# Patient Record
Sex: Male | Born: 1935 | Race: Black or African American | Hispanic: No | Marital: Single | Smoking: Former smoker
Health system: Southern US, Community
[De-identification: ages and names within clinical notes are randomized; demographics above are authoritative.]

## PROBLEM LIST (undated history)

## (undated) DIAGNOSIS — J9600 Acute respiratory failure, unspecified whether with hypoxia or hypercapnia: Secondary | ICD-10-CM

## (undated) DIAGNOSIS — E876 Hypokalemia: Secondary | ICD-10-CM

## (undated) DIAGNOSIS — I469 Cardiac arrest, cause unspecified: Secondary | ICD-10-CM

## (undated) DIAGNOSIS — I639 Cerebral infarction, unspecified: Secondary | ICD-10-CM

## (undated) DIAGNOSIS — R4182 Altered mental status, unspecified: Secondary | ICD-10-CM

## (undated) DIAGNOSIS — I10 Essential (primary) hypertension: Secondary | ICD-10-CM

## (undated) DIAGNOSIS — R569 Unspecified convulsions: Secondary | ICD-10-CM

## (undated) HISTORY — DX: Acute respiratory failure, unspecified whether with hypoxia or hypercapnia: J96.00

## (undated) HISTORY — DX: Unspecified convulsions: R56.9

## (undated) HISTORY — DX: Altered mental status, unspecified: R41.82

## (undated) HISTORY — DX: Hypokalemia: E87.6

---

## 2001-10-30 ENCOUNTER — Encounter: Payer: Self-pay | Admitting: Internal Medicine

## 2001-10-30 ENCOUNTER — Ambulatory Visit (HOSPITAL_COMMUNITY): Admission: RE | Admit: 2001-10-30 | Discharge: 2001-10-30 | Payer: Self-pay | Admitting: Internal Medicine

## 2006-03-20 ENCOUNTER — Emergency Department (HOSPITAL_COMMUNITY): Admission: EM | Admit: 2006-03-20 | Discharge: 2006-03-20 | Payer: Self-pay | Admitting: Emergency Medicine

## 2012-01-04 ENCOUNTER — Emergency Department (HOSPITAL_COMMUNITY): Payer: Medicare Other

## 2012-01-04 ENCOUNTER — Inpatient Hospital Stay (HOSPITAL_COMMUNITY)
Admission: EM | Admit: 2012-01-04 | Discharge: 2012-01-09 | DRG: 064 | Disposition: A | Discharge status: Home / Self Care | Payer: Medicare Other | Attending: Internal Medicine | Admitting: Internal Medicine

## 2012-01-04 ENCOUNTER — Inpatient Hospital Stay (HOSPITAL_COMMUNITY): Payer: Medicare Other

## 2012-01-04 ENCOUNTER — Encounter (HOSPITAL_COMMUNITY): Payer: Self-pay | Admitting: Neurology

## 2012-01-04 DIAGNOSIS — I129 Hypertensive chronic kidney disease with stage 1 through stage 4 chronic kidney disease, or unspecified chronic kidney disease: Secondary | ICD-10-CM | POA: Diagnosis present

## 2012-01-04 DIAGNOSIS — N189 Chronic kidney disease, unspecified: Secondary | ICD-10-CM | POA: Diagnosis not present

## 2012-01-04 DIAGNOSIS — E876 Hypokalemia: Secondary | ICD-10-CM | POA: Diagnosis not present

## 2012-01-04 DIAGNOSIS — R918 Other nonspecific abnormal finding of lung field: Secondary | ICD-10-CM | POA: Diagnosis not present

## 2012-01-04 DIAGNOSIS — I6509 Occlusion and stenosis of unspecified vertebral artery: Secondary | ICD-10-CM | POA: Diagnosis not present

## 2012-01-04 DIAGNOSIS — M6281 Muscle weakness (generalized): Secondary | ICD-10-CM | POA: Diagnosis not present

## 2012-01-04 DIAGNOSIS — J9819 Other pulmonary collapse: Secondary | ICD-10-CM | POA: Diagnosis not present

## 2012-01-04 DIAGNOSIS — R262 Difficulty in walking, not elsewhere classified: Secondary | ICD-10-CM | POA: Diagnosis not present

## 2012-01-04 DIAGNOSIS — R4182 Altered mental status, unspecified: Secondary | ICD-10-CM | POA: Diagnosis not present

## 2012-01-04 DIAGNOSIS — I161 Hypertensive emergency: Secondary | ICD-10-CM | POA: Diagnosis present

## 2012-01-04 DIAGNOSIS — J9600 Acute respiratory failure, unspecified whether with hypoxia or hypercapnia: Secondary | ICD-10-CM | POA: Diagnosis not present

## 2012-01-04 DIAGNOSIS — J690 Pneumonitis due to inhalation of food and vomit: Secondary | ICD-10-CM | POA: Diagnosis not present

## 2012-01-04 DIAGNOSIS — Z09 Encounter for follow-up examination after completed treatment for conditions other than malignant neoplasm: Secondary | ICD-10-CM | POA: Diagnosis not present

## 2012-01-04 DIAGNOSIS — R4701 Aphasia: Secondary | ICD-10-CM | POA: Diagnosis not present

## 2012-01-04 DIAGNOSIS — I10 Essential (primary) hypertension: Secondary | ICD-10-CM | POA: Diagnosis not present

## 2012-01-04 DIAGNOSIS — I639 Cerebral infarction, unspecified: Secondary | ICD-10-CM

## 2012-01-04 DIAGNOSIS — G319 Degenerative disease of nervous system, unspecified: Secondary | ICD-10-CM | POA: Diagnosis not present

## 2012-01-04 DIAGNOSIS — I6350 Cerebral infarction due to unspecified occlusion or stenosis of unspecified cerebral artery: Principal | ICD-10-CM | POA: Diagnosis present

## 2012-01-04 DIAGNOSIS — I6789 Other cerebrovascular disease: Secondary | ICD-10-CM | POA: Diagnosis not present

## 2012-01-04 DIAGNOSIS — I749 Embolism and thrombosis of unspecified artery: Secondary | ICD-10-CM | POA: Diagnosis not present

## 2012-01-04 DIAGNOSIS — I6330 Cerebral infarction due to thrombosis of unspecified cerebral artery: Secondary | ICD-10-CM | POA: Diagnosis not present

## 2012-01-04 DIAGNOSIS — I69998 Other sequelae following unspecified cerebrovascular disease: Secondary | ICD-10-CM | POA: Diagnosis not present

## 2012-01-04 DIAGNOSIS — R569 Unspecified convulsions: Secondary | ICD-10-CM | POA: Diagnosis not present

## 2012-01-04 DIAGNOSIS — G9349 Other encephalopathy: Secondary | ICD-10-CM | POA: Diagnosis present

## 2012-01-04 DIAGNOSIS — N289 Disorder of kidney and ureter, unspecified: Secondary | ICD-10-CM | POA: Diagnosis present

## 2012-01-04 DIAGNOSIS — I658 Occlusion and stenosis of other precerebral arteries: Secondary | ICD-10-CM | POA: Diagnosis not present

## 2012-01-04 HISTORY — DX: Essential (primary) hypertension: I10

## 2012-01-04 LAB — COMPREHENSIVE METABOLIC PANEL
ALT: 8 U/L (ref 0–53)
Alkaline Phosphatase: 84 U/L (ref 39–117)
Alkaline Phosphatase: 82 U/L (ref 39–117)
BUN: 11 mg/dL (ref 6–23)
CO2: 20 mEq/L (ref 19–32)
Calcium: 8.2 mg/dL — ABNORMAL LOW (ref 8.4–10.5)
Chloride: 100 mEq/L (ref 96–112)
Creatinine, Ser: 1.35 mg/dL (ref 0.50–1.35)
GFR calc Af Amer: 50 mL/min — ABNORMAL LOW (ref >90)
GFR calc Af Amer: 56 mL/min — ABNORMAL LOW (ref >90)
GFR calc non Af Amer: 44 mL/min — ABNORMAL LOW (ref >90)
Glucose, Bld: 155 mg/dL — ABNORMAL HIGH (ref 70–99)
Glucose, Bld: 129 mg/dL — ABNORMAL HIGH (ref 70–99)
Potassium: 3 mEq/L — ABNORMAL LOW (ref 3.5–5.1)
Sodium: 139 mEq/L (ref 135–145)
Total Protein: 7.1 g/dL (ref 6.0–8.3)

## 2012-01-04 LAB — POCT I-STAT 3, ART BLOOD GAS (G3+)
Bicarbonate: 26.5 mEq/L — ABNORMAL HIGH (ref 20.0–24.0)
pCO2 arterial: 48.3 mmHg — ABNORMAL HIGH (ref 35.0–45.0)
pH, Arterial: 7.347 — ABNORMAL LOW (ref 7.350–7.450)

## 2012-01-04 LAB — URINE MICROSCOPIC-ADD ON

## 2012-01-04 LAB — MAGNESIUM
Magnesium: 1.8 mg/dL (ref 1.5–2.5)
Magnesium: 1.7 mg/dL (ref 1.5–2.5)

## 2012-01-04 LAB — URINALYSIS, ROUTINE W REFLEX MICROSCOPIC
Ketones, ur: NEGATIVE mg/dL
Leukocytes, UA: NEGATIVE
Nitrite: NEGATIVE
Specific Gravity, Urine: 1.017 (ref 1.005–1.030)
Urobilinogen, UA: 0.2 mg/dL (ref 0.0–1.0)
pH: 5.5 (ref 5.0–8.0)

## 2012-01-04 LAB — PROTIME-INR
INR: 1.14 (ref 0.00–1.49)
Prothrombin Time: 14.5 seconds (ref 11.6–15.2)
Prothrombin Time: 14.8 seconds (ref 11.6–15.2)

## 2012-01-04 LAB — CBC WITH DIFFERENTIAL/PLATELET
Eosinophils Absolute: 0 10*3/uL (ref 0.0–0.7)
Eosinophils Relative: 0 % (ref 0–5)
Hemoglobin: 14.1 g/dL (ref 13.0–17.0)
Lymphs Abs: 1.9 10*3/uL (ref 0.7–4.0)
MCH: 31.1 pg (ref 26.0–34.0)
MCV: 94.7 fL (ref 78.0–100.0)
Monocytes Absolute: 1 10*3/uL (ref 0.1–1.0)
Monocytes Relative: 7 % (ref 3–12)
RBC: 4.54 MIL/uL (ref 4.22–5.81)

## 2012-01-04 LAB — CBC
MCV: 95.6 fL (ref 78.0–100.0)
Platelets: 271 10*3/uL (ref 150–400)
RBC: 4.77 MIL/uL (ref 4.22–5.81)
WBC: 12.9 10*3/uL — ABNORMAL HIGH (ref 4.0–10.5)

## 2012-01-04 LAB — BLOOD GAS, ARTERIAL
Bicarbonate: 22 mEq/L (ref 20.0–24.0)
PEEP: 5 cmH2O
TCO2: 23.3 mmol/L (ref 0–100)
pCO2 arterial: 41.6 mmHg (ref 35.0–45.0)
pH, Arterial: 7.337 — ABNORMAL LOW (ref 7.350–7.450)
pO2, Arterial: 136 mmHg — ABNORMAL HIGH (ref 80.0–100.0)

## 2012-01-04 LAB — POCT I-STAT, CHEM 8
BUN: 12 mg/dL (ref 6–23)
Calcium, Ion: 1.1 mmol/L — ABNORMAL LOW (ref 1.13–1.30)
Chloride: 102 mEq/L (ref 96–112)
Glucose, Bld: 160 mg/dL — ABNORMAL HIGH (ref 70–99)
Potassium: 2.9 mEq/L — ABNORMAL LOW (ref 3.5–5.1)

## 2012-01-04 LAB — PHOSPHORUS
Phosphorus: 2.8 mg/dL (ref 2.3–4.6)
Phosphorus: 2.3 mg/dL (ref 2.3–4.6)

## 2012-01-04 LAB — APTT: aPTT: 30 seconds (ref 24–37)

## 2012-01-04 LAB — DIFFERENTIAL
Eosinophils Relative: 1 % (ref 0–5)
Lymphocytes Relative: 28 % (ref 12–46)
Lymphs Abs: 3.6 10*3/uL (ref 0.7–4.0)
Neutro Abs: 8.3 10*3/uL — ABNORMAL HIGH (ref 1.7–7.7)
Neutrophils Relative %: 64 % (ref 43–77)

## 2012-01-04 LAB — LACTIC ACID, PLASMA: Lactic Acid, Venous: 4 mmol/L — ABNORMAL HIGH (ref 0.5–2.2)

## 2012-01-04 LAB — GLUCOSE, CAPILLARY: Glucose-Capillary: 155 mg/dL — ABNORMAL HIGH (ref 70–99)

## 2012-01-04 MED ORDER — LABETALOL HCL 5 MG/ML IV SOLN
10.0000 mg | Freq: Once | INTRAVENOUS | Status: AC
  Administered 2012-01-04: 10 mg via INTRAVENOUS
  Filled 2012-01-04: qty 4

## 2012-01-04 MED ORDER — SODIUM CHLORIDE 0.9 % IV SOLN
1000.0000 mg | INTRAVENOUS | Status: AC
  Administered 2012-01-04: 1000 mg via INTRAVENOUS
  Filled 2012-01-04: qty 20

## 2012-01-04 MED ORDER — PROPOFOL 10 MG/ML IV EMUL
5.0000 ug/kg/min | INTRAVENOUS | Status: DC
  Administered 2012-01-04: 10 ug/kg/min via INTRAVENOUS
  Administered 2012-01-04: 7 ug/kg/min via INTRAVENOUS
  Administered 2012-01-04 – 2012-01-05 (×2): 5 ug/kg/min via INTRAVENOUS
  Administered 2012-01-05 (×2): 10 ug/kg/min via INTRAVENOUS
  Administered 2012-01-05: 3 ug/kg/min via INTRAVENOUS
  Administered 2012-01-06: 15 ug/kg/min via INTRAVENOUS
  Administered 2012-01-06: 5 ug/kg/min via INTRAVENOUS
  Filled 2012-01-04 (×3): qty 100

## 2012-01-04 MED ORDER — HEPARIN SODIUM (PORCINE) 5000 UNIT/ML IJ SOLN
5000.0000 [IU] | Freq: Three times a day (TID) | INTRAMUSCULAR | Status: DC
  Administered 2012-01-04 – 2012-01-07 (×8): 5000 [IU] via SUBCUTANEOUS
  Filled 2012-01-04 (×11): qty 1

## 2012-01-04 MED ORDER — SODIUM CHLORIDE 0.9 % IV SOLN
100.0000 mg | Freq: Three times a day (TID) | INTRAVENOUS | Status: DC
  Administered 2012-01-04 – 2012-01-05 (×2): 100 mg via INTRAVENOUS
  Filled 2012-01-04 (×5): qty 2

## 2012-01-04 MED ORDER — NICARDIPINE HCL IN NACL 20-0.86 MG/200ML-% IV SOLN
5.0000 mg/h | INTRAVENOUS | Status: DC
  Filled 2012-01-04: qty 200

## 2012-01-04 MED ORDER — ROCURONIUM BROMIDE 50 MG/5ML IV SOLN
INTRAVENOUS | Status: AC
  Filled 2012-01-04: qty 2

## 2012-01-04 MED ORDER — LABETALOL HCL 5 MG/ML IV SOLN
10.0000 mg | Freq: Once | INTRAVENOUS | Status: AC
Start: 2012-01-04 — End: 2012-01-04
  Administered 2012-01-04: 10 mg via INTRAVENOUS

## 2012-01-04 MED ORDER — PANTOPRAZOLE SODIUM 40 MG IV SOLR
40.0000 mg | INTRAVENOUS | Status: DC
  Administered 2012-01-04 – 2012-01-06 (×3): 40 mg via INTRAVENOUS
  Filled 2012-01-04 (×4): qty 40

## 2012-01-04 MED ORDER — ACETAMINOPHEN 160 MG/5ML PO SOLN
650.0000 mg | Freq: Four times a day (QID) | ORAL | Status: DC | PRN
  Administered 2012-01-05 – 2012-01-06 (×2): 650 mg
  Filled 2012-01-04 (×3): qty 20.3

## 2012-01-04 MED ORDER — LORAZEPAM 2 MG/ML IJ SOLN
2.0000 mg | Freq: Once | INTRAMUSCULAR | Status: AC
  Administered 2012-01-04: 2 mg via INTRAVENOUS

## 2012-01-04 MED ORDER — IPRATROPIUM-ALBUTEROL 18-103 MCG/ACT IN AERO
6.0000 | INHALATION_SPRAY | RESPIRATORY_TRACT | Status: DC | PRN
  Filled 2012-01-04: qty 14.7

## 2012-01-04 MED ORDER — LIDOCAINE HCL (CARDIAC) 20 MG/ML IV SOLN
INTRAVENOUS | Status: AC
  Filled 2012-01-04: qty 5

## 2012-01-04 MED ORDER — PHENYTOIN SODIUM 50 MG/ML IJ SOLN
100.0000 mg | Freq: Three times a day (TID) | INTRAMUSCULAR | Status: DC
  Filled 2012-01-04: qty 2

## 2012-01-04 MED ORDER — ETOMIDATE 2 MG/ML IV SOLN
INTRAVENOUS | Status: AC
  Administered 2012-01-04: 20 mg
  Filled 2012-01-04: qty 20

## 2012-01-04 MED ORDER — POTASSIUM CHLORIDE 20 MEQ/15ML (10%) PO LIQD
40.0000 meq | Freq: Three times a day (TID) | ORAL | Status: AC
  Administered 2012-01-04 – 2012-01-05 (×3): 40 meq
  Filled 2012-01-04 (×3): qty 30

## 2012-01-04 MED ORDER — NICARDIPINE HCL IN NACL 20-0.86 MG/200ML-% IV SOLN
5.0000 mg/h | INTRAVENOUS | Status: DC
  Administered 2012-01-04: 5 mg/h via INTRAVENOUS
  Filled 2012-01-04: qty 200

## 2012-01-04 MED ORDER — LORAZEPAM 2 MG/ML IJ SOLN
INTRAMUSCULAR | Status: AC
  Filled 2012-01-04: qty 1

## 2012-01-04 MED ORDER — SODIUM CHLORIDE 0.9 % IV SOLN
INTRAVENOUS | Status: DC
  Administered 2012-01-04: 100 mL/h via INTRAVENOUS
  Administered 2012-01-07: 20 mL/h via INTRAVENOUS

## 2012-01-04 MED ORDER — LORAZEPAM 2 MG/ML IJ SOLN
2.0000 mg | Freq: Once | INTRAMUSCULAR | Status: AC
  Administered 2012-01-04: 2 mg via INTRAVENOUS
  Filled 2012-01-04: qty 1

## 2012-01-04 MED ORDER — SUCCINYLCHOLINE CHLORIDE 20 MG/ML IJ SOLN
INTRAMUSCULAR | Status: AC
  Administered 2012-01-04: 150 mg
  Filled 2012-01-04: qty 10

## 2012-01-04 MED ORDER — LORAZEPAM 2 MG/ML IJ SOLN
INTRAMUSCULAR | Status: AC
  Administered 2012-01-04: 1 mg via INTRAVENOUS
  Filled 2012-01-04: qty 1

## 2012-01-04 MED ORDER — LORAZEPAM 2 MG/ML IJ SOLN
1.0000 mg | Freq: Once | INTRAMUSCULAR | Status: AC
  Administered 2012-01-04: 1 mg via INTRAVENOUS

## 2012-01-04 MED ORDER — SODIUM CHLORIDE 0.9 % IV SOLN
25.0000 ug/h | INTRAVENOUS | Status: DC
  Administered 2012-01-04: 75 ug/h via INTRAVENOUS
  Administered 2012-01-04: 25 ug/h via INTRAVENOUS
  Administered 2012-01-04: 50 ug/h via INTRAVENOUS
  Administered 2012-01-05: 75 ug/h via INTRAVENOUS
  Administered 2012-01-05: 25 ug/h via INTRAVENOUS
  Administered 2012-01-05: 200 ug/h via INTRAVENOUS
  Administered 2012-01-06: 300 ug/h via INTRAVENOUS
  Filled 2012-01-04 (×3): qty 50

## 2012-01-04 MED ORDER — AMPICILLIN-SULBACTAM SODIUM 1.5 (1-0.5) G IJ SOLR
1.5000 g | Freq: Four times a day (QID) | INTRAMUSCULAR | Status: DC
  Administered 2012-01-04 – 2012-01-07 (×11): 1.5 g via INTRAVENOUS
  Filled 2012-01-04 (×13): qty 1.5

## 2012-01-04 NOTE — ED Notes (Signed)
Verbal order given by Audrey-RN for temp foley.

## 2012-01-04 NOTE — ED Notes (Signed)
MD at bedside.  VO start Cardene whe SBP is above 190 and titrate to keep SBP at 185.  VO per DR Leonel Ramsay.

## 2012-01-04 NOTE — Progress Notes (Signed)
ANTIBIOTIC CONSULT NOTE - INITIAL  Pharmacy Consult for unasyn Indication: ?aspiration pneumonia  Allergies not on file  Patient Measurements: Height: 5\' 9"  (175.3 cm) Weight: 165 lb (74.844 kg) IBW/kg (Calculated) : 70.7   Vital Signs: Temp: 97.7 F (36.5 C) (08/09 1912) Temp src: Core (Comment) (08/09 1810) BP: 144/59 mmHg (08/09 2026) Pulse Rate: 79  (08/09 2026) Intake/Output from previous day:   Intake/Output from this shift:    Labs:  Basename 01/04/12 1502 01/04/12 1458  WBC 12.9* --  HGB 14.8 16.7  PLT 271 --  LABCREA -- --  CREATININE 1.48* 1.50*   Estimated Creatinine Clearance: 41.1 ml/min (by C-G formula based on Cr of 1.48). No results found for this basename: VANCOTROUGH:2,VANCOPEAK:2,VANCORANDOM:2,GENTTROUGH:2,GENTPEAK:2,GENTRANDOM:2,TOBRATROUGH:2,TOBRAPEAK:2,TOBRARND:2,AMIKACINPEAK:2,AMIKACINTROU:2,AMIKACIN:2, in the last 72 hours   Microbiology: No results found for this or any previous visit (from the past 720 hour(s)).  Medical History: Past Medical History  Diagnosis Date  . Hypertension     Medications:  No prescriptions prior to admission   Assessment: 67 yom with a history of hypertension presented to the ED with hypertensive urgency after he stopped taking his medications. Pt began to have seizure activity and was intubated to protect his airway. He is not going to be started on empiric unasyn for possibly aspiration pneumonia. He is afebrile with a WBC of 13.6. Per his ex-wife, he does not have any known drug allergies.   Goal of Therapy:  Infection treatment  Plan:  1. Unasyn 1.5gm IV Q6H 2. F/u renal function, C&S and any allergy updates  Arvie Villarruel, Rande Lawman 01/04/2012,9:03 PM

## 2012-01-04 NOTE — ED Notes (Addendum)
Received bedside report from [place], [place].  Patient in no acute or respiratory distress; report has been called by Luellen Pucker, RN.  Patient ready to be transported, but we are currently waiting on EEG tech to come down to assist with transport.  EEG tech has been paged twice before 1900; paged once again after 1900.  Still no call back at this time.  Both charge RN and AD is aware (AD made previous two calls to EEG tech).  Family at bedside; updated family on plan of care.  Will continue to monitor.

## 2012-01-04 NOTE — ED Notes (Signed)
Pt intubated with 7.5 ett at 23 at the lip, good bilateral breath sounds pt tolerated fine.

## 2012-01-04 NOTE — ED Notes (Signed)
Pt here for code stroke, was with nephew ate lunch, started seeing blinking and had a headache, pt was on way home and leaning to the left, here with left gaze left weakness. Started seizing on way back from ct.

## 2012-01-04 NOTE — Procedures (Signed)
Arterial Catheter Insertion Procedure Note Chris Simpson [id number] 1932/04/15  Procedure: Insertion of Arterial Catheter  Indications: Blood pressure monitoring  Procedure Details Consent: Unable to obtain consent because of altered level of consciousness. Time Out: Verified patient identification, verified procedure, site/side was marked, verified correct patient position, special equipment/implants available, medications/allergies/relevent history reviewed, required imaging and test results available.  Performed  Maximum sterile technique was used including antiseptics, cap, gloves, gown, hand hygiene, mask and sheet. Skin prep: Chlorhexidine; local anesthetic administered 20 gauge catheter was inserted into right radial artery using the Seldinger technique.  Evaluation Blood flow good; BP tracing good. Complications: No apparent complications.   Chris Simpson 01/04/2012

## 2012-01-04 NOTE — Progress Notes (Signed)
Restarted EEG continuous; pt transferred from ED to NICU 3109.

## 2012-01-04 NOTE — Consult Note (Signed)
Reason for Consult: code stroke   CC: code stroke  HPI: Chris Simpson is an 78 y.o. male with HTN. Patient was with his nephew eating at a restaurant at 13:50 when patient noted a flashing light in his vision.  While he was in the restaurant he collapsed and had notable seizure. Patient was emergently brought tot he ED as a code stroke. Initial CT head was negative for stroke. Initial BP was 210/110 via EMS.  While in ED his BP dropped and HR ranged between 30-40.  After patient was brought back to Trauma C patient began to show focal left sided seizure which became generalized TC.  Patient was emergently intubated and given total of 4 mg Ativan.  Dilantin was ordered.   tPA was not given due to seizure at onset  Past Medical History  Diagnosis Date  . Hypertension     No past surgical history on file.  No family history on file.  Social History:  does not have a smoking history on file. He does not have any smokeless tobacco history on file. His alcohol and drug histories not on file.  Allergies not on file  Medications:  Current Facility-Administered Medications  Medication Dose Route Frequency Provider Last Rate Last Dose  . etomidate (AMIDATE) 2 MG/ML injection        20 mg at 01/04/12 1509  . fosPHENYtoin (CEREBYX) 1,000 mg PE in sodium chloride 0.9 % 50 mL IVPB  1,000 mg PE Intravenous STAT Etta Quill, PA      . labetalol (NORMODYNE,TRANDATE) injection 10 mg  10 mg Intravenous Once Renaldo Reel, MD   10 mg at 01/04/12 1528  . labetalol (NORMODYNE,TRANDATE) injection 10 mg  10 mg Intravenous Once Roland Rack, MD   10 mg at 01/04/12 1543  . lidocaine (cardiac) 100 mg/35ml (XYLOCAINE) 20 MG/ML injection 2%           . LORazepam (ATIVAN) 2 MG/ML injection           . LORazepam (ATIVAN) injection 1 mg  1 mg Intravenous Once Shaune Pollack, MD   1 mg at 01/04/12 1505  . LORazepam (ATIVAN) injection 2 mg  2 mg Intravenous Once Roland Rack, MD   2 mg at 01/04/12 1532  .  LORazepam (ATIVAN) injection 2 mg  2 mg Intravenous Once Renaldo Reel, MD   2 mg at 01/04/12 1540  . rocuronium (ZEMURON) 50 MG/5ML injection           . succinylcholine (ANECTINE) 20 MG/ML injection        150 mg at 01/04/12 1510   No current outpatient prescriptions on file.    ROS: Unable to be obtained  Physical Examination: Blood pressure 216/121, pulse 92, resp. rate 15, SpO2 100.00%.  Neurologic Examination Mental Status:Intubated and sedated Cranial Nerves: II-no blink to threat II/IV/VI-pupilary reflex intact, eyes with forced deviation to the left, positive corneal reflex  V/VII-face symmetrical IX/X-Gag intact prior to intubation Motor: left arm and leg with increased tone. Left leg withdraws to painful stimuli, right arm has normal one and brisk withdrawl to noxious stimuli. Right leg normal tone and withdrawals from noxious stimuli.  Sensory: as above DTR's: 2+ in upper extremities and bilateral knees.  No AJ noted. Down going toes bilaterally.    Laboratory Studies:   Basic Metabolic Panel:  Lab [id number]  NA 140  K 2.9*  CL 102  CO2 --  GLUCOSE 160*  BUN 12  CREATININE 1.50*  CALCIUM --  MG --  PHOS --    Liver Function Tests: No results found for this basename: AST:5,ALT:5,ALKPHOS:5,BILITOT:5,PROT:5,ALBUMIN:5 in the last 168 hours No results found for this basename: LIPASE:5,AMYLASE:5 in the last 168 hours No results found for this basename: AMMONIA:3 in the last 168 hours  CBC:  Lab 01/04/12 1502 01/04/12 1458  WBC 12.9* --  NEUTROABS 8.3* --  HGB 14.8 16.7  HCT 45.6 49.0  MCV 95.6 --  PLT 271 --    Cardiac Enzymes: No results found for this basename: CKTOTAL:5,CKMB:5,CKMBINDEX:5,TROPONINI:5 in the last 168 hours  BNP: No components found with this basename: POCBNP:5  CBG:  Lab 01/04/12 1509  GLUCAP 155*    Microbiology: No results found for this or any previous visit.  Coagulation Studies:  Basename 01/04/12 1502    LABPROT 14.5  INR 1.11    Urinalysis: No results found for this basename: COLORURINE:2,APPERANCEUR:2,LABSPEC:2,PHURINE:2,GLUCOSEU:2,HGBUR:2,BILIRUBINUR:2,KETONESUR:2,PROTEINUR:2,UROBILINOGEN:2,NITRITE:2,LEUKOCYTESUR:2 in the last 168 hours   Alcohol Level: No results found for this basename: ETH:2 in the last 168 hours  Other results: EKG: normal EKG, normal sinus rhythm, unchanged from previous tracings, sinus tachycardia.  Imaging: Ct Head Wo Contrast  01/04/2012  *RADIOLOGY REPORT*  Clinical Data: New onset seizures.  Aphasia.  Code stroke.  CT HEAD WITHOUT CONTRAST  Technique:  Contiguous axial images were obtained from the base of the skull through the vertex without contrast.  Comparison: None.  Findings: Moderate cortical and mild deep atrophy.  Moderate cerebellar atrophy.  Old lacunar strokes in the thalami bilaterally and dating right basal ganglia.  No mass lesion.  No midline shift. No acute hemorrhage or hematoma.  No extra-axial fluid collections. No evidence of acute infarction.  No skull fracture or other focal osseous abnormality involving the skull.  Visualized paranasal sinuses, bilateral mastoid air cells, and bilateral middle ear cavities well-aerated.  Moderate bilateral carotid siphon atherosclerosis.  IMPRESSION:  1.  No acute intracranial abnormality. 2.  Moderate generalized atrophy. 3.  Old lacunar strokes in the thalami bilaterally and the right basal ganglia.  These results were called by telephone on 01/04/2012 at 1500 hours to Dr. Jeanell Sparrow of the emergency department, who verbally acknowledged these results.  Original Report Authenticated By: Deniece Portela, M.D.   Dg Chest Portable 1 View  01/04/2012  *RADIOLOGY REPORT*  Clinical Data: Endotracheal tube placement.  PORTABLE CHEST - 1 VIEW  Comparison: 03/20/2006.  Findings: Endotracheal tube is present with the tip 2.4 cm from the carina.  Lung volumes slightly low with basilar atelectasis.  No airspace disease.  No  effusion. Monitoring leads are projected over the chest. Poor cardiopericardial silhouette within normal limits.  IMPRESSION: Endotracheal tube tip 24 mm from the carina.  Original Report Authenticated By: Dereck Ligas, M.D.     Assessment/Plan: 78 YO male with HTN who presented to ED as code stroke.  Patients initial CT head negative for stroke. While in ED patient showing what likely is status epilepticus.  Additional 2 mg Ativan given and Dilantin currently being hung.  STAT EEG in process.   Recommend: 1) EEG 2) BP management per PCCM 3) would obtain MRI when able 4) Dilantin 100mg  TID 5) Appreciate PCCM assistance.    Etta Quill PA-C [facility] [phone]  01/04/2012, 3:46 PM  I have seen the patient and on my initial exam, the patient was recently intubated and paralyzed. Subsequently, he began having voluntary movements of the legs bilaterally. He did have bilateral clonus.   He was not seizing on the beside review of his  EEG.   We will continue the EEG until the patient changes rooms or goes to MRI, at that time, if no continued concern for seizure, would discontinue monitoring and perform MRI.   With his vision loss and seizure in the setting of malignant hypertension, I suspect PRES as an etiology.   Roland Rack, MD [facility] [phone]

## 2012-01-04 NOTE — Code Documentation (Signed)
Code Stroke Called [id number] Patient arrival  48 EDP exam 1445 Stroke team arrival  [id number] Pt arrival in [place]  Pt seizing after CT scan,  Pt intubated by ED staff for airway protection. Code Stroke canceled  [id number]    =

## 2012-01-04 NOTE — H&P (Signed)
Name: Chris Simpson MRN: [id number] DOB: August 06, 1931    LOS: 0  Referring Provider:  ER  Reason for Referral:  Status Epilepticus / Resp Failure    PULMONARY / CRITICAL CARE MEDICINE  HPI:  78 y/o M with PMH of HTN who recently stopped taking his BP meds (per family) presented to [facility] ED on 8/9 with hypertensive urgency (inital pressure 210/110) / AMS / Code Stroke.  After CT scan of head he began having seizure activity and was intubated for airway protection.  Initial CT negative for stroke.  He was noted prior to intubation to have left sided gaze and lean.  PCCM consulted for ICU admit.      Past Medical History  Diagnosis Date  . Hypertension    No past surgical history on file. Prior to Admission medications   Not on File   Allergies Allergies not on file  Family History No family history on file. Social History  does not have a smoking history on file. He does not have any smokeless tobacco history on file. His alcohol and drug histories not on file.  Review Of Systems:  Unable to complete as patient is on vent.   Events Since Admission: 8/9 - admit as CODE STROKE, seizures, intubated   Vital Signs: Temp:  [96.3 F (35.7 C)-97.9 F (36.6 C)] 97.9 F (36.6 C) (08/09 1630) Pulse Rate:  [81-98] 92  (08/09 1630) Resp:  [11-20] 16  (08/09 1630) BP: (152-227)/(84-157) 210/134 mmHg (08/09 1630) SpO2:  [97 %-100 %] 100 % (08/09 1630) FiO2 (%):  [100 %] 100 % (08/09 1512)  Physical Examination: General:  wdwn adult male in NAD on vent Neuro:  Sedate on vent, myoclonus of lower extremities noted, left eye gaze noted, face symmetrical HEENT:  Mm pink/moist, OETT Cardiovascular:  s1s2 rrr, distant tones Lungs:  resp's even/non-labored, lungs bilaterally coarse rhonchi Abdomen:  Flat, soft, bsx4 active Musculoskeletal:  No acute deformities Skin:  Warm/dry  Principal Problem:  *Hypertensive emergency Active Problems:  Acute respiratory failure   Seizure   ASSESSMENT AND PLAN  PULMONARY No results found for this basename: PHART:5,PCO2:5,PCO2ART:5,PO2ART:5,HCO3:5,O2SAT:5 in the last 168 hours Ventilator Settings: Vent Mode:  [-] PRVC FiO2 (%):  [100 %] 100 % Set Rate:  [14 bmp] 14 bmp Vt Set:  [560 mL] 560 mL PEEP:  [5 cmH20] 5 cmH20 Plateau Pressure:  [16 cmH20] 16 cmH20  CXR:  Endotracheal tube is present with the tip 2.4 cm from the carina. Lung volumes slightly low with basilar atelectasis. No airspace disease. No effusion. Monitoring leads are projected over the chest. Poor cardiopericardial silhouette within normal limits.  ETT:  8/9 >>>  A:  Acute Respiratory Failure-in setting of status epilepticus  P:   -full vent support -f/u cxr in am -abg in one hour -combivent  CARDIOVASCULAR  Lab 01/04/12 1445  TROPONINI <0.30  LATICACIDVEN --  PROBNP --   ECG:   Lines:    A:  Hypertensive Emergency  P:  -cardene gtt for goal bp 150-180 -ICU admit, tele -assess cardiac enzymes, EKG, BNP   NEUROLOGIC 8/9 CT HEAD>>>No acute intracranial abnormality.  Moderate generalized atrophy.  Old lacunar strokes in the thalami bilaterally and the right basal ganglia   A:   AMS  Rule Out CVA, PRES Status Epilepticus  P:   -bp goal 150-180 per neurology -Neurology Following -EEG -Repeat MRI -neuro checks   RENAL  Lab 01/04/12 1502 01/04/12 1458  NA 139 140  K 3.0* 2.9*  CL 100 102  CO2 20 --  BUN 12 12  CREATININE 1.48* 1.50*  CALCIUM 9.1 --  MG 1.8 --  PHOS 2.8 --   Intake/Output    None    Foley:  8/9>>>  A:   Hypokalemia Acute Renal Insufficiency   P:   -replace K -gentle hydration  GASTROINTESTINAL  Lab 01/04/12 1502  AST 16  ALT 8  ALKPHOS 84  BILITOT 0.3  PROT 7.7  ALBUMIN 3.5    A:  NPO while intubated  P:   -plan for TF 8/10 if remains intubated -insert NGT  HEMATOLOGIC  Lab 01/04/12 1502 01/04/12 1458  HGB 14.8 16.7  HCT 45.6 49.0  PLT 271 --  INR 1.11 --   APTT 29 --   A:   No acute issues  P:  -monitor CBC  INFECTIOUS  Lab 01/04/12 1502  WBC 12.9*  PROCALCITON --   Cultures: 8/9 Sputum>>> 8/9 UA>>> 8/9 BCx2>>> 8/9 UC>>>  Antibiotics: 8/9 Unasyn>>>  A:   Concern for aspiration PNA  P:   -empiric coverage for aspiration -follow cultures -check lactic acid  ENDOCRINE  Lab 01/04/12 1509  GLUCAP 155*   A:   Hyperglycemia   P:   -CBG Q4 while NPO  Noe Gens, NP-C [facility] Pgr: [phone] or [phone]   01/04/2012, 4:37 PM  Will admit to 3100, seizure management per neuro.  Target SBP of 150-180, cardene ordered, if patient becomes agitated or wakes up then will start propofol but do not want to drop below target SBP to prevent ischemia.  Vent and medical management done, will defer otherwise to neuro.  Family updated bedside.  CC time 55 min.  Patient seen and examined, agree with above note.  I dictated the care and orders written for this patient under my direction.  [name], M.D. [phone]

## 2012-01-04 NOTE — ED Notes (Signed)
Spoke with rapid response nurse; RRN states that there is no tech for EEGs at night, and that we will be unable to get a hold of anyone.  Called 3100; 3100 states that it is okay to transport patient upstairs.  Patient being transported upstairs on "baby" monitor with RN, nurse tech, and respiratory tech.  EEG leads still attached at this time; 3100 nurse aware.

## 2012-01-04 NOTE — Progress Notes (Signed)
Patient's ex wife Junious Dresser at bedside.  States she is HCPOA but has no paperwork.  Brin Scaturro, [phone] sister of patient, states that Ms. Mendel Ryder is not to make any decisions that she and patients sons will make decisions regarding patients care.  Sons coming from [place] should arrive on 810/13

## 2012-01-04 NOTE — Progress Notes (Signed)
Stat portable EEG completed; returned and initiated prolonged EEG per new order.

## 2012-01-04 NOTE — ED Notes (Signed)
Pt's CBG was 155 when I just checked it. 3:12 pm JG.

## 2012-01-04 NOTE — ED Notes (Signed)
Pt given 2 doses of ativan at 1505 and 1515, one dose was not seen after linking medication.Marland Kitchen

## 2012-01-04 NOTE — ED Provider Notes (Signed)
History     CSN: [id number]  Arrival date & time 01/04/12  1445   First MD Initiated Contact with Patient 01/04/12 1507      Chief Complaint  Patient presents with  . Code Stroke    (Consider location/radiation/quality/duration/timing/severity/associated sxs/prior treatment) Patient is a 78 y.o. male presenting with seizures. The history is provided by the EMS personnel and a relative.  Seizures  This is a new problem. The current episode started less than 1 hour ago. The problem has not changed since onset.Number of times: unknown. The most recent episode lasted more than 5 minutes. Characteristics include eye deviation. The episode was witnessed. The seizures continued in the ED. There has been no fever. Medications administered prior to arrival include lorazepam IV.    Past Medical History  Diagnosis Date  . Hypertension     No past surgical history on file.  No family history on file.  History  Substance Use Topics  . Smoking status: Not on file  . Smokeless tobacco: Not on file  . Alcohol Use: Not on file      Review of Systems  Unable to perform ROS: Mental status change  Neurological: Positive for seizures.    Allergies  Review of patient's allergies indicates not on file.  Home Medications  No current outpatient prescriptions on file.  BP 127/72  Pulse 80  Temp 97.9 F (36.6 C) (Core (Comment))  Resp 14  Wt 165 lb (74.844 kg)  SpO2 100%  Physical Exam  Nursing note and vitals reviewed. Constitutional: He appears well-developed. He appears distressed.  HENT:  Head: Normocephalic and atraumatic.  Eyes: Conjunctivae are normal. Pupils are equal, round, and reactive to light.  Neck: Neck supple.  Cardiovascular: Normal rate, regular rhythm, normal heart sounds and intact distal pulses.   No murmur heard. Pulmonary/Chest: Effort normal and breath sounds normal. He has no wheezes. He has no rales.  Abdominal: Soft. He exhibits no distension.    Musculoskeletal: He exhibits no edema.  Neurological: He is unresponsive. He displays seizure activity. GCS eye subscore is 1. GCS verbal subscore is 1. GCS motor subscore is 1.  Skin: Skin is warm and dry. No rash noted.    ED Course  INTUBATION Performed by: Renaldo Reel Authorized by: Renaldo Reel Consent: The procedure was performed in an emergent situation. Indications: airway protection Intubation method: video-assisted Patient status: paralyzed (RSI) Preoxygenation: nonrebreather mask Sedatives: etomidate Paralytic: succinylcholine Tube type: cuffed Number of attempts: 1 Cords visualized: yes Post-procedure assessment: chest rise and CO2 detector Breath sounds: equal Cuff inflated: yes ETT to lip: 23 cm Tube secured with: ETT holder Chest x-ray interpreted by me and radiologist. Chest x-ray findings: endotracheal tube in appropriate position Patient tolerance: Patient tolerated the procedure well with no immediate complications.   (including critical care time)  Labs Reviewed  CBC - Abnormal; Notable for the following:    WBC 12.9 (*)     All other components within normal limits  DIFFERENTIAL - Abnormal; Notable for the following:    Neutro Abs 8.3 (*)     All other components within normal limits  COMPREHENSIVE METABOLIC PANEL - Abnormal; Notable for the following:    Potassium 3.0 (*)     Glucose, Bld 155 (*)     Creatinine, Ser 1.48 (*)     GFR calc non Af Amer 44 (*)     GFR calc Af Amer 50 (*)     All other components within normal limits  POCT  I-STAT, CHEM 8 - Abnormal; Notable for the following:    Potassium 2.9 (*)     Creatinine, Ser 1.50 (*)     Glucose, Bld 160 (*)     Calcium, Ion 1.10 (*)     All other components within normal limits  GLUCOSE, CAPILLARY - Abnormal; Notable for the following:    Glucose-Capillary 155 (*)     All other components within normal limits  URINALYSIS, ROUTINE W REFLEX MICROSCOPIC - Abnormal; Notable for the  following:    APPearance HAZY (*)     Hgb urine dipstick MODERATE (*)     Protein, ur 100 (*)     All other components within normal limits  PHENYTOIN LEVEL, TOTAL - Abnormal; Notable for the following:    Phenytoin Lvl <2.5 (*)     All other components within normal limits  URINE MICROSCOPIC-ADD ON - Abnormal; Notable for the following:    Casts HYALINE CASTS (*)     All other components within normal limits  POCT I-STAT 3, BLOOD GAS (G3+) - Abnormal; Notable for the following:    pH, Arterial 7.347 (*)     pCO2 arterial 48.3 (*)     pO2, Arterial 528.0 (*)     Bicarbonate 26.5 (*)     All other components within normal limits  PROTIME-INR  APTT  CK TOTAL AND CKMB  TROPONIN I  MAGNESIUM  PHOSPHORUS  ETHANOL  COMPREHENSIVE METABOLIC PANEL  MAGNESIUM  PHOSPHORUS  BLOOD GAS, ARTERIAL  ALBUMIN  PHENYTOIN LEVEL, TOTAL  ALBUMIN  CULTURE, BLOOD (ROUTINE X 2)  CULTURE, BLOOD (ROUTINE X 2)  PROCALCITONIN  URINALYSIS, ROUTINE W REFLEX MICROSCOPIC  URINE CULTURE   Ct Head Wo Contrast  01/04/2012  *RADIOLOGY REPORT*  Clinical Data: New onset seizures.  Aphasia.  Code stroke.  CT HEAD WITHOUT CONTRAST  Technique:  Contiguous axial images were obtained from the base of the skull through the vertex without contrast.  Comparison: None.  Findings: Moderate cortical and mild deep atrophy.  Moderate cerebellar atrophy.  Old lacunar strokes in the thalami bilaterally and dating right basal ganglia.  No mass lesion.  No midline shift. No acute hemorrhage or hematoma.  No extra-axial fluid collections. No evidence of acute infarction.  No skull fracture or other focal osseous abnormality involving the skull.  Visualized paranasal sinuses, bilateral mastoid air cells, and bilateral middle ear cavities well-aerated.  Moderate bilateral carotid siphon atherosclerosis.  IMPRESSION:  1.  No acute intracranial abnormality. 2.  Moderate generalized atrophy. 3.  Old lacunar strokes in the thalami bilaterally  and the right basal ganglia.  These results were called by telephone on 01/04/2012 at 1500 hours to Dr. Jeanell Sparrow of the emergency department, who verbally acknowledged these results.  Original Report Authenticated By: Deniece Portela, M.D.   Dg Chest Portable 1 View  01/04/2012  *RADIOLOGY REPORT*  Clinical Data: Endotracheal tube placement.  PORTABLE CHEST - 1 VIEW  Comparison: 03/20/2006.  Findings: Endotracheal tube is present with the tip 2.4 cm from the carina.  Lung volumes slightly low with basilar atelectasis.  No airspace disease.  No effusion. Monitoring leads are projected over the chest. Poor cardiopericardial silhouette within normal limits.  IMPRESSION: Endotracheal tube tip 24 mm from the carina.  Original Report Authenticated By: Dereck Ligas, M.D.     1. Hypertension   2. Acute respiratory failure   3. Altered mental status   4. CRI (chronic renal insufficiency)   5. Hypertensive emergency   6. Hypokalemia  7. Seizure       MDM  78 yo male with PMHx of HTN who presents from home for seizure.  Pt was sitting with his nephew when he became unresponsive and was noted to have eye deviation to the left.  On EMS arrival pt appeared to be post-ictal.  En route to the ED pt began seizing again and was given 1 mg Ativan.  Blood pressure with EMS in the 200's/100s.  On arrival to the ED pt taken straight to the scanner where no bleed was identified.  Vital signs initially with SBP in the low 100s and HR 39.  Pt appeared to be seizing with eye deviation to the left, GCS 3, and was given additional Ativan.  Vital signs improved with BP in the 200's/90's.  Pt was intubated for airway protection as documented above.  Neurology at bedside.  Will obtain EEG, labs, and start fosphenytoin for continued seizure activity.  Pt will be admitted to the ICU for further management.        Renaldo Reel, MD 01/05/12 [id number]

## 2012-01-05 ENCOUNTER — Inpatient Hospital Stay (HOSPITAL_COMMUNITY): Payer: Medicare Other

## 2012-01-05 ENCOUNTER — Encounter (HOSPITAL_COMMUNITY): Payer: Self-pay

## 2012-01-05 DIAGNOSIS — E876 Hypokalemia: Secondary | ICD-10-CM | POA: Diagnosis not present

## 2012-01-05 DIAGNOSIS — J9600 Acute respiratory failure, unspecified whether with hypoxia or hypercapnia: Secondary | ICD-10-CM | POA: Diagnosis not present

## 2012-01-05 DIAGNOSIS — I6509 Occlusion and stenosis of unspecified vertebral artery: Secondary | ICD-10-CM | POA: Diagnosis not present

## 2012-01-05 DIAGNOSIS — J9819 Other pulmonary collapse: Secondary | ICD-10-CM | POA: Diagnosis not present

## 2012-01-05 DIAGNOSIS — R569 Unspecified convulsions: Secondary | ICD-10-CM | POA: Diagnosis not present

## 2012-01-05 DIAGNOSIS — R4182 Altered mental status, unspecified: Secondary | ICD-10-CM | POA: Diagnosis not present

## 2012-01-05 DIAGNOSIS — I10 Essential (primary) hypertension: Secondary | ICD-10-CM | POA: Diagnosis not present

## 2012-01-05 DIAGNOSIS — I658 Occlusion and stenosis of other precerebral arteries: Secondary | ICD-10-CM | POA: Diagnosis not present

## 2012-01-05 LAB — BASIC METABOLIC PANEL
BUN: 12 mg/dL (ref 6–23)
Chloride: 107 mEq/L (ref 96–112)
GFR calc Af Amer: 54 mL/min — ABNORMAL LOW (ref >90)
GFR calc non Af Amer: 47 mL/min — ABNORMAL LOW (ref >90)
Potassium: 3.8 mEq/L (ref 3.5–5.1)
Sodium: 143 mEq/L (ref 135–145)

## 2012-01-05 LAB — CBC
MCH: 31.3 pg (ref 26.0–34.0)
MCV: 96.1 fL (ref 78.0–100.0)
RBC: 4.38 MIL/uL (ref 4.22–5.81)

## 2012-01-05 LAB — PHENYTOIN LEVEL, TOTAL: Phenytoin Lvl: 14.3 ug/mL (ref 10.0–20.0)

## 2012-01-05 LAB — PHOSPHORUS: Phosphorus: 1.7 mg/dL — ABNORMAL LOW (ref 2.3–4.6)

## 2012-01-05 LAB — ALBUMIN: Albumin: 2.8 g/dL — ABNORMAL LOW (ref 3.5–5.2)

## 2012-01-05 MED ORDER — DEXTROSE 5 % IV SOLN
2.0000 g | Freq: Once | INTRAVENOUS | Status: DC
  Filled 2012-01-05: qty 4

## 2012-01-05 MED ORDER — POTASSIUM PHOSPHATE DIBASIC 3 MMOLE/ML IV SOLN: 20.0000 mmol | Freq: Once | INTRAVENOUS | Status: DC

## 2012-01-05 MED ORDER — BIOTENE DRY MOUTH MT LIQD
15.0000 mL | Freq: Four times a day (QID) | OROMUCOSAL | Status: DC
  Administered 2012-01-05 – 2012-01-06 (×5): 15 mL via OROMUCOSAL

## 2012-01-05 MED ORDER — CHLORHEXIDINE GLUCONATE 0.12 % MT SOLN
15.0000 mL | Freq: Two times a day (BID) | OROMUCOSAL | Status: DC
  Administered 2012-01-05 – 2012-01-06 (×3): 15 mL via OROMUCOSAL
  Filled 2012-01-05 (×2): qty 15

## 2012-01-05 MED ORDER — CHLORHEXIDINE GLUCONATE 0.12 % MT SOLN
OROMUCOSAL | Status: AC
  Filled 2012-01-05: qty 15

## 2012-01-05 MED ORDER — MAGNESIUM SULFATE 40 MG/ML IJ SOLN
2.0000 g | Freq: Once | INTRAMUSCULAR | Status: AC
  Administered 2012-01-05: 2 g via INTRAVENOUS
  Filled 2012-01-05: qty 50

## 2012-01-05 MED ORDER — BIOTENE DRY MOUTH MT LIQD: 15.0000 mL | Freq: Four times a day (QID) | OROMUCOSAL | Status: DC

## 2012-01-05 MED ORDER — POTASSIUM PHOSPHATE DIBASIC 3 MMOLE/ML IV SOLN
20.0000 mmol | Freq: Once | INTRAVENOUS | Status: AC
  Administered 2012-01-05: 20 mmol via INTRAVENOUS
  Filled 2012-01-05: qty 6.67

## 2012-01-05 MED ORDER — PHENYTOIN SODIUM 50 MG/ML IJ SOLN
100.0000 mg | Freq: Three times a day (TID) | INTRAMUSCULAR | Status: DC
  Administered 2012-01-05 – 2012-01-08 (×9): 100 mg via INTRAVENOUS
  Filled 2012-01-05 (×12): qty 2

## 2012-01-05 NOTE — Progress Notes (Signed)
INITIAL ADULT NUTRITION ASSESSMENT Date: 01/05/2012   Time: 1:33 PM  Reason for Assessment: Consult, TF initiation and management  INTERVENTION: If unable to extubate patient, recommend initiate Osmolite 1.5 at 20 ml/hr. Increase rate by 10 ml/hr every 4 hours to goal rate of 50 ml/hr. This will provide 1800 kcal, 75 g protein, and 914 ml free water.   ASSESSMENT: Male 78 y.o.  Dx: Hypertensive emergency  Hx:  Past Medical History  Diagnosis Date  . Hypertension   ] Related Meds:  Scheduled Meds:   . ampicillin-sulbactam (UNASYN) IV  1.5 g Intravenous Q6H  . antiseptic oral rinse  15 mL Mouth Rinse QID  . chlorhexidine  15 mL Mouth Rinse BID  . etomidate      . fosPHENYtoin (CEREBYX) IV  1,000 mg PE Intravenous STAT  . heparin subcutaneous  5,000 Units Subcutaneous Q8H  . labetalol  10 mg Intravenous Once  . labetalol  10 mg Intravenous Once  . lidocaine (cardiac) 100 mg/19ml      . LORazepam      . LORazepam  1 mg Intravenous Once  . LORazepam  2 mg Intravenous Once  . LORazepam  2 mg Intravenous Once  . magnesium sulfate 1 - 4 g bolus IVPB  2 g Intravenous Once  . pantoprazole (PROTONIX) IV  40 mg Intravenous Q24H  . phenytoin (DILANTIN) IV  100 mg Intravenous Q8H  . potassium chloride  40 mEq Per Tube TID  . potassium phosphate IVPB (mmol)  20 mmol Intravenous Once  . rocuronium      . succinylcholine      . DISCONTD: antiseptic oral rinse  15 mL Mouth Rinse QID  . DISCONTD: fosPHENYtoin (CEREBYX) IV  100 mg PE Intravenous TID  . DISCONTD: magnesium sulfate LVP 250-500 ml  2 g Intravenous Once  . DISCONTD: phenytoin (DILANTIN) IV  100 mg Intravenous Q8H  . DISCONTD: potassium phosphate IVPB (mmol)  20 mmol Intravenous Once   Continuous Infusions:   . sodium chloride 100 mL/hr at 01/05/12 1241  . fentaNYL infusion INTRAVENOUS 50 mcg/hr (01/05/12 1030)  . niCARDipine Stopped (01/04/12 2206)  . propofol 11.141 mcg/kg/min (01/05/12 1100)  . DISCONTD: niCARDipine       PRN Meds:.acetaminophen (TYLENOL) oral liquid 160 mg/5 mL, albuterol-ipratropium'  Ht: 5\' 9"  (175.3 cm)  Wt: 162 lb 4.1 oz (73.6 kg)  Ideal Wt: 72.7 kg  % Ideal Wt: 101%  Usual Wt: Unknown % Usual Wt: Unknown  Body mass index is 23.96 kg/(m^2).  Food/Nutrition Related Hx: Unable to obtain history from patient.   Labs:  CMP     Component Value Date/Time   NA 143 01/05/2012 0533   K 3.8 01/05/2012 0533   CL 107 01/05/2012 0533   CO2 24 01/05/2012 0533   GLUCOSE 86 01/05/2012 0533   BUN 12 01/05/2012 0533   CREATININE 1.40* 01/05/2012 0533   CALCIUM 8.3* 01/05/2012 0533   PROT 7.1 01/04/2012 2013   ALBUMIN 2.8* 01/05/2012 0533   AST 18 01/04/2012 2013   ALT 8 01/04/2012 2013   ALKPHOS 82 01/04/2012 2013   BILITOT 0.5 01/04/2012 2013   GFRNONAA 47* 01/05/2012 0533   GFRAA 54* 01/05/2012 0533    Intake/Output Summary (Last 24 hours) at 01/05/12 1338 Last data filed at 01/05/12 1300  Gross per 24 hour  Intake 3342.22 ml  Output   1534 ml  Net 1808.22 ml    Diet Order: NPO  Supplements/Tube Feeding: None  IVF:    sodium  chloride Last Rate: 100 mL/hr at 01/05/12 1241  fentaNYL infusion INTRAVENOUS Last Rate: 50 mcg/hr (01/05/12 1030)  niCARDipine Last Rate: Stopped (01/04/12 2206)  propofol Last Rate: 11.141 mcg/kg/min (01/05/12 1100)  DISCONTD: niCARDipine     Estimated Nutritional Needs:   Kcal: 1780 kcal Protein: 75-85 g Fluid: 2.2 L  Patient is intubated with NG tube in proximal stomach. Consult received for initiation and management of enteral nutrition. Per RN, patient to go for MRI today, with possible extubation. RN aware of TF recommendations, will order or page RD if patient not extubated.   NUTRITION DIAGNOSIS: -Inadequate oral intake (NI-2.1).  Status: Ongoing  RELATED TO: inability to eat  AS EVIDENCE BY: NPO status  MONITORING/EVALUATION(Goals): Patient will meet 90-100% of estimated nutrition needs.   Monitor: TF initiation, extubation status, weight,  labs  EDUCATION NEEDS: -No education needs identified at this time   DOCUMENTATION CODES Per approved criteria  -Not Applicable    Antony Haste 01/05/2012, 1:33 PM

## 2012-01-05 NOTE — Progress Notes (Signed)
Name: NOHE KUCZMARSKI MRN: [id number] DOB: 05/13/1932    LOS: 1 Date of admit: 01/04/2012  2:46 PM   Referring Provider:  ER  Reason for Referral:  Status Epilepticus / Resp Failure    PULMONARY / CRITICAL CARE MEDICINE  HPI:  78 y/o M with PMH of HTN who recently stopped taking his BP meds (per family) presented to [facility] ED on 8/9 with hypertensive urgency (inital pressure 210/110) / AMS / Code Stroke.  After CT scan of head he began having seizure activity and was intubated for airway protection.  Initial CT negative for stroke.  He was noted prior to intubation to have left sided gaze and lean.  PCCM consulted for ICU admit. On admit date of 01/04/2012     Events Since Admission: 8/9 - admit as CODE STROKE, seizures, intubated    SUBJECTIVE/OVERNIGHT/INTERVAL HX Was on cardene gtt but had decrease in bp and cardene stopped Also, diprivan gtt reduced.  Neuro planing repeat MRI. EEG still in progress. No fever   Vital Signs: Temp:  [96.3 F (35.7 C)-100.4 F (38 C)] 100 F (37.8 C) (08/10 0809) Pulse Rate:  [72-102] 84  (08/10 0809) Resp:  [11-21] 21  (08/10 0809) BP: (113-227)/(59-157) 152/79 mmHg (08/10 0809) SpO2:  [97 %-100 %] 100 % (08/10 0809) Arterial Line BP: (83-225)/(52-108) 96/63 mmHg (08/10 0700) FiO2 (%):  [40 %-100 %] 40 % (08/10 0809) Weight:  [73.6 kg (162 lb 4.1 oz)-74.844 kg (165 lb)] 73.6 kg (162 lb 4.1 oz) (08/10 0500)  Physical Examination: General:  wdwn adult male in NAD on vent Neuro:  Sedate on vent, myoclonus of lower extremities noted, left eye gaze noted, face symmetrical (per rN follows commands on left but not on right, restelss) HEENT:  Mm pink/moist, OETT Cardiovascular:  s1s2 rrr, distant tones Lungs:  resp's even/non-labored, lungs bilaterally coarse rhonchi Abdomen:  Flat, soft, bsx4 active Musculoskeletal:  No acute deformities Skin:  Warm/dry   Ct Head Wo Contrast  01/04/2012  *RADIOLOGY REPORT*  Clinical Data: New onset seizures.   Aphasia.  Code stroke.  CT HEAD WITHOUT CONTRAST  Technique:  Contiguous axial images were obtained from the base of the skull through the vertex without contrast.  Comparison: None.  Findings: Moderate cortical and mild deep atrophy.  Moderate cerebellar atrophy.  Old lacunar strokes in the thalami bilaterally and dating right basal ganglia.  No mass lesion.  No midline shift. No acute hemorrhage or hematoma.  No extra-axial fluid collections. No evidence of acute infarction.  No skull fracture or other focal osseous abnormality involving the skull.  Visualized paranasal sinuses, bilateral mastoid air cells, and bilateral middle ear cavities well-aerated.  Moderate bilateral carotid siphon atherosclerosis.  IMPRESSION:  1.  No acute intracranial abnormality. 2.  Moderate generalized atrophy. 3.  Old lacunar strokes in the thalami bilaterally and the right basal ganglia.  These results were called by telephone on 01/04/2012 at 1500 hours to Dr. Jeanell Sparrow of the emergency department, who verbally acknowledged these results.  Original Report Authenticated By: Deniece Portela, M.D.   Dg Chest Port 1 View  01/04/2012  *RADIOLOGY REPORT*  Clinical Data: Post procedure  PORTABLE CHEST - 1 VIEW  Comparison: 01/04/2012  Findings: Endotracheal tube tip unchanged in position, abutting the right margin of the trachea however located proximal to the carina. An NG tube descends into the abdomen, tip not visualized. Cardiomediastinal contours are unchanged.  Mild retrocardiac /lung base opacities. The lung apices are poorly visualized due to overlying  structures.  No pneumothorax or pleural effusion.  No interval osseous change.  IMPRESSION: Endotracheal tube is unchanged in position.  Mild left greater than right lung base opacities, likely atelectasis.  Original Report Authenticated By: Suanne Marker, M.D.   Dg Chest Portable 1 View  01/04/2012  *RADIOLOGY REPORT*  Clinical Data: Endotracheal tube placement.  PORTABLE  CHEST - 1 VIEW  Comparison: 03/20/2006.  Findings: Endotracheal tube is present with the tip 2.4 cm from the carina.  Lung volumes slightly low with basilar atelectasis.  No airspace disease.  No effusion. Monitoring leads are projected over the chest. Poor cardiopericardial silhouette within normal limits.  IMPRESSION: Endotracheal tube tip 24 mm from the carina.  Original Report Authenticated By: Dereck Ligas, M.D.     Principal Problem:  *Hypertensive emergency Active Problems:  Acute respiratory failure  Seizure  Altered mental status  Hypokalemia  CRI (chronic renal insufficiency)   ASSESSMENT AND PLAN  PULMONARY  Lab 01/04/12 2033 01/04/12 1702  PHART 7.337* 7.347*  PCO2ART 41.6 48.3*  PO2ART 136.0* 528.0*  HCO3 22.0 26.5*  O2SAT 98.7 100.0   Ventilator Settings: Vent Mode:  [-] PRVC FiO2 (%):  [40 %-100 %] 40 % Set Rate:  [14 bmp] 14 bmp Vt Set:  [560 mL] 560 mL PEEP:  [5 cmH20] 5 cmH20 Plateau Pressure:  [15 [id number] cmH20] 20 cmH20  CXR:  01/05/12 - similar to 01/04/12 ETT:  8/9 >>>  A:  Acute Respiratory Failure-in setting of status epilepticus - on 01/05/12 not sbt or extubation candidate due to mental status   P:   -full vent support -combivent  CARDIOVASCULAR  Lab 01/04/12 2013 01/04/12 1445  TROPONINI -- <0.30  LATICACIDVEN 4.0* --  PROBNP 547.8* --   ECG:   Lines:    A:  Hypertensive Emergency - started cardene 01/04/12   Ion 01/05/12 - noticed spont bp drop and cardene stpped  P:  - neuro wants sbp goal 140-160   NEUROLOGIC 8/9 CT HEAD>>>No acute intracranial abnormality.  Moderate generalized atrophy.  Old lacunar strokes in the thalami bilaterally and the right basal ganglia   A:   AMS  Rule Out CVA, PRES Status Epilepticus  P:   -bp goal [id number] per neurology -Neurology Following -EEG -Repeat MRI 01/05/12 -neuro checks   RENAL  Lab 01/05/12 0533 01/04/12 2013 01/04/12 1502 01/04/12 1458  NA 143 137 139 140  K 3.8 3.1*  -- --  CL 107 102 100 102  CO2 24 22 20  --  BUN 12 11 12 12   CREATININE 1.40* 1.35 1.48* 1.50*  CALCIUM 8.3* 8.2* 9.1 --  MG 1.6 1.7 1.8 --  PHOS 1.7* 2.3 2.8 --   Intake/Output      08/09 0701 - 08/10 0700 08/10 0701 - 08/11 0700   I.V. (mL/kg) 2210 (30)    IV Piggyback 135    Total Intake(mL/kg) 2345 (31.9)    Urine (mL/kg/hr) 1359 (0.8)    Total Output 1359    Net +986          Foley:  8/9>>>  A:   Hypokalemia, Hypomag, Hypophos Acute Renal Insufficiency  - mild  P:   -replace K, mag and phos -gentle hydration  GASTROINTESTINAL  Lab 01/05/12 0533 01/04/12 2013 01/04/12 1502  AST -- 18 16  ALT -- 8 8  ALKPHOS -- 82 84  BILITOT -- 0.5 0.3  PROT -- 7.1 7.7  ALBUMIN 2.8* 3.2* 3.5    A:  NPO while intubated  P:   -  plan for TF 8/10 if remains intubated; consulted nutrition 01/05/12   HEMATOLOGIC  Lab 01/05/12 0533 01/04/12 2013 01/04/12 1502 01/04/12 1458  HGB 13.7 14.1 14.8 16.7  HCT 42.1 43.0 45.6 49.0  PLT 187 220 271 --  INR -- 1.14 1.11 --  APTT -- 30 29 --   A:   No acute issues  P:  -monitor CBC  INFECTIOUS  Lab 01/05/12 0533 01/04/12 2013 01/04/12 1744 01/04/12 1502  WBC 10.9* 13.6* -- 12.9*  PROCALCITON -- -- <0.10 --   Cultures: 8/9 Sputum>>> 8/9 UA>>> 8/9 BCx2>>> 8/9 UC>>>  Antibiotics: 8/9 Unasyn>>>  A:   Concern for aspiration PNA  P:   -empiric coverage for aspiration -follow cultures - rechek PCT 01/07/12 and dc abx if still low  ENDOCRINE  Lab 01/04/12 1509  GLUCAP 155*   A:   Hyperglycemia   P:   -CBG Q4 while NPO    Patient is critically ill with multiple organ failure and highg risk of death or organ failure deterioration. Critical care time 35 minutes  Dr. Brand Males, M.D., [facility].C.P Pulmonary and Critical Care Medicine Staff Physician [facility] Pager: [phone], If no answer or between  15:00h - 7:00h: call [phone]  01/05/2012 8:35  AM

## 2012-01-05 NOTE — Progress Notes (Signed)
Continuous EEG d/c'ed.

## 2012-01-05 NOTE — Progress Notes (Signed)
Subjective:  Patient remains intubated and on EEG. He has no further seizures reported overnight.  Objective: Current vital signs: BP 152/79  Pulse 84  Temp 100 F (37.8 C) (Core (Comment))  Resp 21  Ht 5\' 9"  (1.753 m)  Wt 73.6 kg (162 lb 4.1 oz)  BMI 23.96 kg/m2  SpO2 100% Vital signs in last 24 hours: Temp:  [96.3 F (35.7 C)-100.4 F (38 C)] 100 F (37.8 C) (08/10 0809) Pulse Rate:  [72-102] 84  (08/10 0809) Resp:  [11-21] 21  (08/10 0809) BP: (113-227)/(59-157) 152/79 mmHg (08/10 0809) SpO2:  [97 %-100 %] 100 % (08/10 0809) Arterial Line BP: (83-225)/(52-108) 96/63 mmHg (08/10 0700) FiO2 (%):  [40 %-100 %] 40 % (08/10 0809) Weight:  [73.6 kg (162 lb 4.1 oz)-74.844 kg (165 lb)] 73.6 kg (162 lb 4.1 oz) (08/10 0500)  Intake/Output from previous day: 2023/02/03 0701 - 08/10 0700 In: 2345 [I.V.:2210; IV Piggyback:135] Out: [id number] [Urine:1359] Intake/Output this shift:   Nutritional status: NPO  Neurologic Exam: Mental Status: Awakens to gentle shaking. He follows commands on his right side, but not on his left Cranial Nerves: II-blinks to therat clearly from left, unclear if was doing from right.  II/IV/VI-will not follow commands to move eyes  And is too awake for doll's eye maneuver VI-symmetric nasolabial folds  VIII- response to commands  Motor:patient moves all extremities spontaneously, however he does follow commands on the right arm, but not with the left arm  Sensory: response to noxious stimuli in all 4 extremities DTR's: 2+ and symmetric  Cerebellar: unable to assess gait: Unable to assess   Lab Results: Basic Metabolic Panel:  Lab [id number] 0533 03-Feb-2012 2013 Feb 03, 2012 1502 02/03/2012 1458  NA 143 137 139 140  K 3.8 3.1* 3.0* 2.9*  CL 107 102 100 102  CO2 24 22 20  --  GLUCOSE 86 129* 155* 160*  BUN 12 11 12 12   CREATININE 1.40* 1.35 1.48* 1.50*  CALCIUM 8.3* 8.2* 9.1 --  MG 1.6 1.7 1.8 --  PHOS 1.7* 2.3 2.8 --    Liver Function Tests:  Lab 01/05/12  0533 03-Feb-2012 2013 2012-02-03 1502  AST -- 18 16  ALT -- 8 8  ALKPHOS -- 82 84  BILITOT -- 0.5 0.3  PROT -- 7.1 7.7  ALBUMIN 2.8* 3.2* 3.5   No results found for this basename: LIPASE:5,AMYLASE:5 in the last 168 hours No results found for this basename: AMMONIA:3 in the last 168 hours  CBC:  Lab 01/05/12 0533 2012/02/03 2013 Feb 03, 2012 1502 02/03/2012 1458  WBC 10.9* 13.6* 12.9* --  NEUTROABS -- 10.7* 8.3* --  HGB 13.7 14.1 14.8 16.7  HCT 42.1 43.0 45.6 49.0  MCV 96.1 94.7 95.6 --  PLT 187 220 271 --    Cardiac Enzymes:  Lab 03-Feb-2012 1445  CKTOTAL 99  CKMB 1.6  CKMBINDEX --  TROPONINI <0.30    Lipid Panel: No results found for this basename: CHOL:5,TRIG:5,HDL:5,CHOLHDL:5,VLDL:5,LDLCALC:5 in the last 168 hours  CBG:  Lab Feb 03, 2012 1509  GLUCAP 155*    Microbiology: Results for orders placed during the hospital encounter of Feb 03, 2012  MRSA PCR SCREENING     Status: Normal   Collection Time   02-03-2012  8:28 PM      Component Value Range Status Comment   MRSA by PCR NEGATIVE  NEGATIVE Final     Coagulation Studies:  Basename 02-03-2012 2013 Feb 03, 2012 1502  LABPROT 14.8 14.5  INR 1.14 1.11    Imaging: Ct Head Wo Contrast  01/04/2012  *RADIOLOGY REPORT*  Clinical Data: New onset seizures.  Aphasia.  Code stroke.  CT HEAD WITHOUT CONTRAST  Technique:  Contiguous axial images were obtained from the base of the skull through the vertex without contrast.  Comparison: None.  Findings: Moderate cortical and mild deep atrophy.  Moderate cerebellar atrophy.  Old lacunar strokes in the thalami bilaterally and dating right basal ganglia.  No mass lesion.  No midline shift. No acute hemorrhage or hematoma.  No extra-axial fluid collections. No evidence of acute infarction.  No skull fracture or other focal osseous abnormality involving the skull.  Visualized paranasal sinuses, bilateral mastoid air cells, and bilateral middle ear cavities well-aerated.  Moderate bilateral carotid siphon  atherosclerosis.  IMPRESSION:  1.  No acute intracranial abnormality. 2.  Moderate generalized atrophy. 3.  Old lacunar strokes in the thalami bilaterally and the right basal ganglia.  These results were called by telephone on 01/04/2012 at 1500 hours to Dr. Jeanell Sparrow of the emergency department, who verbally acknowledged these results.  Original Report Authenticated By: Deniece Portela, M.D.   Dg Chest Port 1 View  01/04/2012  *RADIOLOGY REPORT*  Clinical Data: Post procedure  PORTABLE CHEST - 1 VIEW  Comparison: 01/04/2012  Findings: Endotracheal tube tip unchanged in position, abutting the right margin of the trachea however located proximal to the carina. An NG tube descends into the abdomen, tip not visualized. Cardiomediastinal contours are unchanged.  Mild retrocardiac /lung base opacities. The lung apices are poorly visualized due to overlying structures.  No pneumothorax or pleural effusion.  No interval osseous change.  IMPRESSION: Endotracheal tube is unchanged in position.  Mild left greater than right lung base opacities, likely atelectasis.  Original Report Authenticated By: Suanne Marker, M.D.   Dg Chest Portable 1 View  01/04/2012  *RADIOLOGY REPORT*  Clinical Data: Endotracheal tube placement.  PORTABLE CHEST - 1 VIEW  Comparison: 03/20/2006.  Findings: Endotracheal tube is present with the tip 2.4 cm from the carina.  Lung volumes slightly low with basilar atelectasis.  No airspace disease.  No effusion. Monitoring leads are projected over the chest. Poor cardiopericardial silhouette within normal limits.  IMPRESSION: Endotracheal tube tip 24 mm from the carina.  Original Report Authenticated By: Dereck Ligas, M.D.    Medications:  Scheduled:   . ampicillin-sulbactam (UNASYN) IV  1.5 g Intravenous Q6H  . antiseptic oral rinse  15 mL Mouth Rinse QID  . chlorhexidine  15 mL Mouth Rinse BID  . chlorhexidine      . etomidate      . fosPHENYtoin (CEREBYX) IV  1,000 mg PE Intravenous  STAT  . fosPHENYtoin (CEREBYX) IV  100 mg PE Intravenous TID  . heparin subcutaneous  5,000 Units Subcutaneous Q8H  . labetalol  10 mg Intravenous Once  . labetalol  10 mg Intravenous Once  . lidocaine (cardiac) 100 mg/75ml      . LORazepam      . LORazepam  1 mg Intravenous Once  . LORazepam  2 mg Intravenous Once  . LORazepam  2 mg Intravenous Once  . magnesium sulfate LVP 250-500 ml  2 g Intravenous Once  . pantoprazole (PROTONIX) IV  40 mg Intravenous Q24H  . potassium chloride  40 mEq Per Tube TID  . potassium phosphate IVPB (mmol)  20 mmol Intravenous Once  . rocuronium      . succinylcholine      . DISCONTD: phenytoin (DILANTIN) IV  100 mg Intravenous Q8H    Assessment/Plan:  78 year old male with new onset seizure in the setting of severe hypertension and vision loss. With his blood pressure and vision loss prior to seizure, I suspect that posterior reversible encephalopathy syndrome is the most likely diagnosis. We will value an MRI of his brain today.   1) can discontinue EEG monitoring  2) MRI brain today 3) continue Dilantin 100 3 times a day 4) appreciate critical-care medicine's assistance   Roland Rack, MD [facility] [phone] 01/05/2012  8:34 AM

## 2012-01-05 NOTE — Procedures (Signed)
LTM EEG Procedure Note  Part A Beginning Time:  5:03 PM August 9,2013 Ending Time: 8:00 PM August 9,2013  Part B Beginning Time: 9:30 PM August 9,2013 Ending Time: 9:36 AM August 10,2013  This prolonged intensive telemetry EEG with simultaneous video monitoring was requested in this 78 year old man with hypertensive emergency and possible PRES syndrome.  He originally presented with unilateral motor movements suspicious for seizure.  Medications include phenytoin, Ativan and propofol.  The EEG captured a period without clinical events of interest as marked by push buttons.  Review of the entirety of the tracing captured periods of prolonged decreased alertness thought be be secondary to sedation.  During these periods background activities were largely composed of low amplitude polymorphic delta activities with overlying frontally dominant beta and alpha activities that sometimes had a burst suppression like pattern.  As the recording progressed there did seem to be less suppression with more continuous activities.  Poorly formed vertex sharp waves were also seen during these periods of sedation.  There were brief periods of wakefulness captured when the patient was stimulated.  During these times, which many times were obscured by significant muscle artifact background activities seem to be in the theta range although at times posterior dominant alpha activities could be seen.  No interictal epileptiform discharges or electrographic seizures were seen.  Clinical Interpretation:  This prolonged intensive telemetry EEG with simultaneous video monitoring captured a period without clinical events of interest.  No electrographic seizures were seen.  Background activities were suggested a mild to moderate encephalopathy that may be secondary to his sedative medications.  Kavin Leech Jacelyn Grip, MD [facility], [place]

## 2012-01-06 ENCOUNTER — Inpatient Hospital Stay (HOSPITAL_COMMUNITY): Payer: Medicare Other

## 2012-01-06 ENCOUNTER — Encounter (HOSPITAL_COMMUNITY): Payer: Self-pay

## 2012-01-06 DIAGNOSIS — J690 Pneumonitis due to inhalation of food and vomit: Secondary | ICD-10-CM | POA: Diagnosis not present

## 2012-01-06 DIAGNOSIS — I6350 Cerebral infarction due to unspecified occlusion or stenosis of unspecified cerebral artery: Secondary | ICD-10-CM | POA: Diagnosis not present

## 2012-01-06 DIAGNOSIS — I10 Essential (primary) hypertension: Secondary | ICD-10-CM | POA: Diagnosis not present

## 2012-01-06 DIAGNOSIS — E876 Hypokalemia: Secondary | ICD-10-CM | POA: Diagnosis not present

## 2012-01-06 DIAGNOSIS — R4182 Altered mental status, unspecified: Secondary | ICD-10-CM | POA: Diagnosis not present

## 2012-01-06 DIAGNOSIS — G9349 Other encephalopathy: Secondary | ICD-10-CM | POA: Diagnosis not present

## 2012-01-06 DIAGNOSIS — J9600 Acute respiratory failure, unspecified whether with hypoxia or hypercapnia: Secondary | ICD-10-CM | POA: Diagnosis not present

## 2012-01-06 LAB — BASIC METABOLIC PANEL
BUN: 12 mg/dL (ref 6–23)
CO2: 21 mEq/L (ref 19–32)
Calcium: 9.2 mg/dL (ref 8.4–10.5)
Creatinine, Ser: 1.48 mg/dL — ABNORMAL HIGH (ref 0.50–1.35)
GFR calc Af Amer: 49 mL/min — ABNORMAL LOW (ref >90)
GFR calc non Af Amer: 42 mL/min — ABNORMAL LOW (ref >90)
Potassium: 4.1 mEq/L (ref 3.5–5.1)
Sodium: 141 mEq/L (ref 135–145)

## 2012-01-06 LAB — URINE CULTURE

## 2012-01-06 LAB — MAGNESIUM: Magnesium: 1.6 mg/dL (ref 1.5–2.5)

## 2012-01-06 LAB — PHOSPHORUS: Phosphorus: 2.9 mg/dL (ref 2.3–4.6)

## 2012-01-06 LAB — OSMOLALITY: Osmolality: 295 mOsm/kg (ref 275–300)

## 2012-01-06 MED ORDER — HYDRALAZINE HCL 20 MG/ML IJ SOLN
10.0000 mg | INTRAMUSCULAR | Status: DC | PRN
  Administered 2012-01-06: 20 mg via INTRAVENOUS
  Administered 2012-01-06: 30 mg via INTRAVENOUS
  Administered 2012-01-06 – 2012-01-07 (×2): 40 mg via INTRAVENOUS
  Filled 2012-01-06 (×3): qty 2
  Filled 2012-01-06: qty 1
  Filled 2012-01-06: qty 2

## 2012-01-06 MED ORDER — HYDRALAZINE HCL 20 MG/ML IJ SOLN
10.0000 mg | Freq: Once | INTRAMUSCULAR | Status: AC
  Administered 2012-01-06: 10 mg via INTRAVENOUS

## 2012-01-06 MED ORDER — ASPIRIN 300 MG RE SUPP
300.0000 mg | Freq: Every day | RECTAL | Status: DC
  Administered 2012-01-06: 300 mg via RECTAL
  Filled 2012-01-06 (×2): qty 1

## 2012-01-06 MED ORDER — ACETAMINOPHEN 160 MG/5ML PO SOLN
650.0000 mg | Freq: Four times a day (QID) | ORAL | Status: DC | PRN
  Administered 2012-01-06: 650 mg via ORAL

## 2012-01-06 MED ORDER — HYDRALAZINE HCL 20 MG/ML IJ SOLN
INTRAMUSCULAR | Status: AC
  Administered 2012-01-06: 20 mg via INTRAVENOUS
  Filled 2012-01-06: qty 1

## 2012-01-06 MED ORDER — ACETAMINOPHEN 325 MG PO TABS: 650.0000 mg | ORAL_TABLET | Freq: Four times a day (QID) | ORAL | Status: DC | PRN

## 2012-01-06 MED ORDER — METOPROLOL TARTRATE 1 MG/ML IV SOLN
2.5000 mg | INTRAVENOUS | Status: DC | PRN
  Administered 2012-01-06 – 2012-01-07 (×5): 5 mg via INTRAVENOUS
  Filled 2012-01-06 (×5): qty 5

## 2012-01-06 MED ORDER — HYDRALAZINE HCL 20 MG/ML IJ SOLN: 10.0000 mg | Freq: Once | INTRAMUSCULAR | Status: AC

## 2012-01-06 MED ORDER — BIOTENE DRY MOUTH MT LIQD
15.0000 mL | Freq: Two times a day (BID) | OROMUCOSAL | Status: DC
  Administered 2012-01-06 – 2012-01-08 (×4): 15 mL via OROMUCOSAL

## 2012-01-06 MED ORDER — HYDRALAZINE HCL 20 MG/ML IJ SOLN
INTRAMUSCULAR | Status: AC
  Administered 2012-01-06: 10 mg via INTRAVENOUS
  Filled 2012-01-06: qty 1

## 2012-01-06 NOTE — Evaluation (Signed)
Clinical/Bedside Swallow Evaluation Patient Details  Name: Chris Simpson MRN: [id number] Date of Birth: 01-13-1932  Today's Date: 01/06/2012 Time: 1100-1130 SLP Time Calculation (min): 30 min  Past Medical History:  Past Medical History  Diagnosis Date  . Hypertension    Past Surgical History: History reviewed. No pertinent past surgical history. HPI:  Chris Simpson is an 78 y.o. male with HTN. Patient was with his nephew eating at a restaurant at 13:50 when patient noted a flashing light in his vision. While he was in the restaurant he collapsed and had notable seizure. Patient was emergently brought tot he ED as a code stroke. Initial CT head was negative for stroke. Initial BP was 210/110 via EMS. While in ED his BP dropped and HR ranged between 30-40. After patient was brought back to Trauma C patient began to show focal left sided seizure which became generalized TC. Patient was emergently intubated and given total of 4 mg Ativan. Dilantin was ordered.     Assessment / Plan / Recommendation Clinical Impression  Patient exhibits a moderate oral and expected pharyngeal dysphagia with difficulty chewing and manipulating solid foods.  Patient also with a delayed swallow initiation and positive audible throat clearing immediately after the swallow.  Voice quality is hoarse at baseline, and wet after sips of H20.      Aspiration Risk  Moderate    Diet Recommendation Dysphagia 1 (Puree);Nectar-thick liquid   Liquid Administration via: Cup Medication Administration: Whole meds with puree Supervision: Staff feed patient;Full supervision/cueing for compensatory strategies Compensations: Slow rate;Small sips/bites;Check for pocketing Postural Changes and/or Swallow Maneuvers: Seated upright 90 degrees    Other  Recommendations Recommended Consults: MBS Oral Care Recommendations: Oral care QID;Staff/trained caregiver to provide oral care Other Recommendations: Order thickener from pharmacy;Have  oral suction available;Prohibited food (jello, ice cream, thin soups);Clarify dietary restrictions   Follow Up Recommendations  Skilled Nursing facility;24 hour supervision/assistance    Frequency and Duration        Pertinent Vitals/Pain n/a    SLP Swallow Goals   Defer treatment plan until MBS completed on 8/12.  Swallow Study Prior Functional Status       General HPI: Chris Simpson is an 78 y.o. male with HTN. Patient was with his nephew eating at a restaurant at 13:50 when patient noted a flashing light in his vision. While he was in the restaurant he collapsed and had notable seizure. Patient was emergently brought tot he ED as a code stroke. Initial CT head was negative for stroke. Initial BP was 210/110 via EMS. While in ED his BP dropped and HR ranged between 30-40. After patient was brought back to Trauma C patient began to show focal left sided seizure which became generalized TC. Patient was emergently intubated and given total of 4 mg Ativan. Dilantin was ordered.   Type of Study: Bedside swallow evaluation Diet Prior to this Study: NPO Temperature Spikes Noted: No Respiratory Status: Supplemental O2 delivered via (comment) History of Recent Intubation: Yes Length of Intubations (days): 1 days Date extubated: 01/06/12 Behavior/Cognition: Confused;Impulsive;Lethargic;Distractible;Requires cueing;Decreased sustained attention Oral Cavity - Dentition: Adequate natural dentition;Missing dentition;Poor condition Self-Feeding Abilities: Needs assist Patient Positioning: Upright in bed Baseline Vocal Quality: Hoarse Volitional Cough: Congested Volitional Swallow: Able to elicit    Oral/Motor/Sensory Function Overall Oral Motor/Sensory Function: Impaired Labial ROM: Reduced right Labial Symmetry: Abnormal symmetry right Labial Strength: Reduced Labial Sensation: Reduced Lingual ROM: Reduced right Lingual Symmetry: Abnormal symmetry right Lingual Strength: Reduced Lingual  Sensation: Reduced  Facial ROM: Reduced right Facial Strength: Reduced Facial Sensation: Reduced Mandible: Within Functional Limits   Ice Chips Ice chips: Within functional limits Presentation: Spoon   Thin Liquid Thin Liquid: Impaired Presentation: Spoon;Cup Oral Phase Impairments: Reduced labial seal Oral Phase Functional Implications: Right anterior spillage Pharyngeal  Phase Impairments: Throat Clearing - Immediate;Wet Vocal Quality;Suspected delayed Swallow    Nectar Thick Nectar Thick Liquid: Within functional limits Presentation: Spoon;Cup   Honey Thick Honey Thick Liquid: Not tested   Puree Puree: Within functional limits Presentation: Spoon   Solid   GO    Solid: Impaired Oral Phase Impairments: Reduced labial seal;Reduced lingual movement/coordination;Impaired anterior to posterior transit;Poor awareness of bolus Oral Phase Functional Implications: Oral residue;Oral holding       Reda Citron T 01/06/2012,11:48 AM

## 2012-01-06 NOTE — Progress Notes (Signed)
Stroke Team Progress Note  HISTORY  Chris Simpson is an 78 y.o. male with HTN. Patient was with his nephew eating at a restaurant at 13:50 when patient noted a flashing light in his vision. While he was in the restaurant he collapsed and had notable seizure. Patient was emergently brought tot he ED as a code stroke. Initial CT head was negative for stroke. Initial BP was 210/110 via EMS. While in ED his BP dropped and HR ranged between 30-40. After patient was brought back to Trauma C patient began to show focal left sided seizure which became generalized TC. Patient was emergently intubated and given total of 4 mg Ativan. Dilantin was ordered.   SUBJECTIVE The patient is sleepy, but he can be alerted. The patient is intubated. No family is at bedside. The patient will follow verbal commands.    OBJECTIVE Most recent Vital Signs: Temp: 99.9 F (37.7 C) (08/11 0751) BP: 190/113 mmHg (08/11 0751) Pulse Rate: 118  (08/11 0751) Respiratory Rate: 21 O2 Saturation: 97%  CBG (last 3)  Basename 01/04/12 1509  GLUCAP 155*   Intake/Output from previous day: 08/10 0701 - 08/11 0700 In: 3321.3 [I.V.:2894.3; NG/GT:50; IV Piggyback:377] Out: 1370 [Urine:1370]  IV Fluid Intake:     . sodium chloride 100 mL/hr at 01/06/12 0200  . fentaNYL infusion INTRAVENOUS Stopped (01/06/12 0705)  . niCARDipine Stopped (01/04/12 2206)  . propofol Stopped (01/06/12 0705)   Medications    . ampicillin-sulbactam (UNASYN) IV  1.5 g Intravenous Q6H  . antiseptic oral rinse  15 mL Mouth Rinse QID  . chlorhexidine  15 mL Mouth Rinse BID  . heparin subcutaneous  5,000 Units Subcutaneous Q8H  . hydrALAZINE  10 mg Intravenous Once  . magnesium sulfate 1 - 4 g bolus IVPB  2 g Intravenous Once  . pantoprazole (PROTONIX) IV  40 mg Intravenous Q24H  . phenytoin (DILANTIN) IV  100 mg Intravenous Q8H  . potassium chloride  40 mEq Per Tube TID  . potassium phosphate IVPB (mmol)  20 mmol Intravenous Once  . DISCONTD:  antiseptic oral rinse  15 mL Mouth Rinse QID  . DISCONTD: fosPHENYtoin (CEREBYX) IV  100 mg PE Intravenous TID  . DISCONTD: magnesium sulfate LVP 250-500 ml  2 g Intravenous Once  . DISCONTD: potassium phosphate IVPB (mmol)  20 mmol Intravenous Once  PRN acetaminophen (TYLENOL) oral liquid 160 mg/5 mL, albuterol-ipratropium  Diet:  NPO Activity:  Bedrest  DVT Prophylaxis:  Subcutaneous heparin  Significant Diagnostic Studies: CBC    Component Value Date/Time   WBC 10.9* 01/05/2012 0533   RBC 4.38 01/05/2012 0533   HGB 13.7 01/05/2012 0533   HCT 42.1 01/05/2012 0533   PLT 187 01/05/2012 0533   MCV 96.1 01/05/2012 0533   MCH 31.3 01/05/2012 0533   MCHC 32.5 01/05/2012 0533   RDW 15.1 01/05/2012 0533   LYMPHSABS 1.9 01/04/2012 2013   MONOABS 1.0 01/04/2012 2013   EOSABS 0.0 01/04/2012 2013   BASOSABS 0.0 01/04/2012 2013   CMP    Component Value Date/Time   NA 143 01/05/2012 0533   K 3.8 01/05/2012 0533   CL 107 01/05/2012 0533   CO2 24 01/05/2012 0533   GLUCOSE 86 01/05/2012 0533   BUN 12 01/05/2012 0533   CREATININE 1.40* 01/05/2012 0533   CALCIUM 8.3* 01/05/2012 0533   PROT 7.1 01/04/2012 2013   ALBUMIN 2.8* 01/05/2012 0533   AST 18 01/04/2012 2013   ALT 8 01/04/2012 2013   ALKPHOS 82 01/04/2012  2013   BILITOT 0.5 01/04/2012 2013   GFRNONAA 47* 01/05/2012 0533   GFRAA 54* 01/05/2012 0533   COAGS Lab Results  Component Value Date   INR 1.14 01/04/2012   INR 1.11 01/04/2012   Lipid Panel No results found for this basename: chol, trig, hdl, cholhdl, vldl, ldlcalc   HgbA1C  No results found for this basename: HGBA1C   Urine Drug Screen  No results found for this basename: labopia, cocainscrnur, labbenz, amphetmu, thcu, labbarb    Alcohol Level    Component Value Date/Time   ETH <11 01/04/2012 1548     No results found for this or any previous visit (from the past 24 hour(s)).  Ct Head Wo Contrast  01/04/2012  *RADIOLOGY REPORT*  Clinical Data: New onset seizures.  Aphasia.  Code stroke.  CT  HEAD WITHOUT CONTRAST  Technique:  Contiguous axial images were obtained from the base of the skull through the vertex without contrast.  Comparison: None.  Findings: Moderate cortical and mild deep atrophy.  Moderate cerebellar atrophy.  Old lacunar strokes in the thalami bilaterally and dating right basal ganglia.  No mass lesion.  No midline shift. No acute hemorrhage or hematoma.  No extra-axial fluid collections. No evidence of acute infarction.  No skull fracture or other focal osseous abnormality involving the skull.  Visualized paranasal sinuses, bilateral mastoid air cells, and bilateral middle ear cavities well-aerated.  Moderate bilateral carotid siphon atherosclerosis.  IMPRESSION:  1.  No acute intracranial abnormality. 2.  Moderate generalized atrophy. 3.  Old lacunar strokes in the thalami bilaterally and the right basal ganglia.  These results were called by telephone on 01/04/2012 at 1500 hours to Dr. Jeanell Sparrow of the emergency department, who verbally acknowledged these results.  Original Report Authenticated By: Deniece Portela, M.D.   Chris Simpson Wo Contrast  01/05/2012  *RADIOLOGY REPORT*  Clinical Data:  Hypertensive patient is presenting with hypertensive urgency.  Seizure-like activity.  MRI BRAIN WITHOUT CONTRAST MRA HEAD WITHOUT CONTRAST  Technique: Multiplanar, multiecho pulse sequences of the brain and surrounding structures were obtained according to standard protocol without intravenous contrast.  Angiographic images of the head were obtained using MRA technique without contrast.  Comparison: 01/04/2012 head CT.  No comparison brain Chris.  MRI HEAD  Findings:  Several acute predominantly small non hemorrhagic infarcts span throughout the right hemisphere involving posterior right frontal lobe, right parietal lobe, right occipital lobe and posterior right temporal lobe with small acute infarct at the junction of the right thalamus and posterior limb of the right internal capsule. Largest  acute infarct medial aspect of the right occipital lobe.  No intracranial hemorrhage.  Remote right corona radiata infarcts.  Small remote left thalamic and basal ganglia infarcts.  Remote small right cerebellar infarct.  Moderate small vessel disease type changes.  Global atrophy without hydrocephalus.  8 x 7.9 x 5.6 mm mass projects from the right pituitary gland to the undersurface of the optic nerves/optic chiasm.  Etiology indeterminate.  This may represent a primary pituitary microadenoma with suprasellar extension although a Rathke cleft cyst or a craniopharyngioma are not entirely excluded.  Paranasal sinus mucosal thickening / opacification with air fluid level/polypoid opacification right maxillary sinus.  Cervical spondylotic changes with spinal stenosis and mild cord flattening C3-4 and C4-5.  Prominence of the superior ophthalmic veins slightly more notable on the left.  No obvious abnormality of the cavernous sinus detected as contributing to this finding.  IMPRESSION:  Several acute predominantly small non  hemorrhagic infarcts span throughout the right hemisphere involving posterior right frontal lobe, right parietal lobe, right occipital lobe and posterior right temporal lobe with small acute infarct at the junction of the right thalamus and posterior limb of the right internal capsule. Largest acute infarct medial aspect of the right occipital lobe.  No intracranial hemorrhage.  Remote right corona radiata infarcts.  Small remote left thalamic and basal ganglia infarcts.  Remote small right cerebellar infarct.  Moderate small vessel disease type changes.  Global atrophy without hydrocephalus.  8 x 7.9 x 5.6 mm mass projects from the right pituitary gland to the undersurface of the optic nerves/optic chiasm.  Etiology indeterminate.  This may represent a primary pituitary microadenoma with suprasellar extension although a Rathke cleft cyst or a craniopharyngioma are not entirely excluded.  Paranasal  sinus mucosal thickening / opacification with air fluid level/polypoid opacification right maxillary sinus.  Cervical spondylotic changes with spinal stenosis and mild cord flattening C3-4 and C4-5.  MRA HEAD  Findings: Left vertebral artery is occluded.  There is a collateral flow within the distal left vertebral artery and left PICA. Left PICA is narrowed and irregular.  Mild narrowing right vertebral artery without high-grade stenosis. Small right PICA.  No high-grade stenosis of the basilar artery.  Nonvisualization right AICA and only small portion left AICA visualized.  Moderate tandem stenosis of the superior cerebellar artery bilaterally.  Moderate to marked narrowing of portions of the posterior cerebral arteries most notable mid to distal aspect although seen proximally as well.  Broad-based bulge of the proximal cavernous segment of the right internal carotid artery may be related to the atherosclerotic type changes although blister aneurysm not entirely excluded.  Mild narrowing of portions of the supraclinoid segment of the internal carotid artery bilaterally.  Mild narrowing portions of the A1 segment of the anterior cerebral artery bilaterally.  Middle cerebral artery and anterior cerebral artery branch vessel irregularity bilaterally.  No obvious aneurysm or vascular malformation.  IMPRESSION:  Left vertebral artery is occluded.  There is a collateral flow within the distal left vertebral artery and left PICA. Left PICA is narrowed and irregular.  Mild narrowing right vertebral artery without high-grade stenosis. Small right PICA.  No high-grade stenosis of the basilar artery.  Nonvisualization right AICA and only small portion left AICA visualized.  Moderate tandem stenosis of the superior cerebellar artery bilaterally.  Moderate to marked narrowing of portions of the posterior cerebral arteries most notable mid to distal aspect although seen proximally as well.  Broad-based bulge of the proximal  cavernous segment of the right internal carotid artery may be related to the atherosclerotic type changes although blister aneurysm not entirely excluded.  Mild narrowing of portions of the supraclinoid segment of the internal carotid artery bilaterally.  Mild narrowing portions of the A1 segment of the anterior cerebral artery bilaterally.  Middle cerebral artery and anterior cerebral artery branch vessel irregularity bilaterally.  This has been made a PRA call report utilizing dashboard call feature.  Original Report Authenticated By: Doug Sou, M.D.   Chris Brain Wo Contrast  01/05/2012  *RADIOLOGY REPORT*  Clinical Data:  Hypertensive patient is presenting with hypertensive urgency.  Seizure-like activity.  MRI BRAIN WITHOUT CONTRAST MRA HEAD WITHOUT CONTRAST  Technique: Multiplanar, multiecho pulse sequences of the brain and surrounding structures were obtained according to standard protocol without intravenous contrast.  Angiographic images of the head were obtained using MRA technique without contrast.  Comparison: 01/04/2012 head CT.  No comparison brain  Chris.  MRI HEAD  Findings:  Several acute predominantly small non hemorrhagic infarcts span throughout the right hemisphere involving posterior right frontal lobe, right parietal lobe, right occipital lobe and posterior right temporal lobe with small acute infarct at the junction of the right thalamus and posterior limb of the right internal capsule. Largest acute infarct medial aspect of the right occipital lobe.  No intracranial hemorrhage.  Remote right corona radiata infarcts.  Small remote left thalamic and basal ganglia infarcts.  Remote small right cerebellar infarct.  Moderate small vessel disease type changes.  Global atrophy without hydrocephalus.  8 x 7.9 x 5.6 mm mass projects from the right pituitary gland to the undersurface of the optic nerves/optic chiasm.  Etiology indeterminate.  This may represent a primary pituitary microadenoma with  suprasellar extension although a Rathke cleft cyst or a craniopharyngioma are not entirely excluded.  Paranasal sinus mucosal thickening / opacification with air fluid level/polypoid opacification right maxillary sinus.  Cervical spondylotic changes with spinal stenosis and mild cord flattening C3-4 and C4-5.  Prominence of the superior ophthalmic veins slightly more notable on the left.  No obvious abnormality of the cavernous sinus detected as contributing to this finding.  IMPRESSION:  Several acute predominantly small non hemorrhagic infarcts span throughout the right hemisphere involving posterior right frontal lobe, right parietal lobe, right occipital lobe and posterior right temporal lobe with small acute infarct at the junction of the right thalamus and posterior limb of the right internal capsule. Largest acute infarct medial aspect of the right occipital lobe.  No intracranial hemorrhage.  Remote right corona radiata infarcts.  Small remote left thalamic and basal ganglia infarcts.  Remote small right cerebellar infarct.  Moderate small vessel disease type changes.  Global atrophy without hydrocephalus.  8 x 7.9 x 5.6 mm mass projects from the right pituitary gland to the undersurface of the optic nerves/optic chiasm.  Etiology indeterminate.  This may represent a primary pituitary microadenoma with suprasellar extension although a Rathke cleft cyst or a craniopharyngioma are not entirely excluded.  Paranasal sinus mucosal thickening / opacification with air fluid level/polypoid opacification right maxillary sinus.  Cervical spondylotic changes with spinal stenosis and mild cord flattening C3-4 and C4-5.  MRA HEAD  Findings: Left vertebral artery is occluded.  There is a collateral flow within the distal left vertebral artery and left PICA. Left PICA is narrowed and irregular.  Mild narrowing right vertebral artery without high-grade stenosis. Small right PICA.  No high-grade stenosis of the basilar  artery.  Nonvisualization right AICA and only small portion left AICA visualized.  Moderate tandem stenosis of the superior cerebellar artery bilaterally.  Moderate to marked narrowing of portions of the posterior cerebral arteries most notable mid to distal aspect although seen proximally as well.  Broad-based bulge of the proximal cavernous segment of the right internal carotid artery may be related to the atherosclerotic type changes although blister aneurysm not entirely excluded.  Mild narrowing of portions of the supraclinoid segment of the internal carotid artery bilaterally.  Mild narrowing portions of the A1 segment of the anterior cerebral artery bilaterally.  Middle cerebral artery and anterior cerebral artery branch vessel irregularity bilaterally.  No obvious aneurysm or vascular malformation.  IMPRESSION:  Left vertebral artery is occluded.  There is a collateral flow within the distal left vertebral artery and left PICA. Left PICA is narrowed and irregular.  Mild narrowing right vertebral artery without high-grade stenosis. Small right PICA.  No high-grade stenosis of the basilar  artery.  Nonvisualization right AICA and only small portion left AICA visualized.  Moderate tandem stenosis of the superior cerebellar artery bilaterally.  Moderate to marked narrowing of portions of the posterior cerebral arteries most notable mid to distal aspect although seen proximally as well.  Broad-based bulge of the proximal cavernous segment of the right internal carotid artery may be related to the atherosclerotic type changes although blister aneurysm not entirely excluded.  Mild narrowing of portions of the supraclinoid segment of the internal carotid artery bilaterally.  Mild narrowing portions of the A1 segment of the anterior cerebral artery bilaterally.  Middle cerebral artery and anterior cerebral artery branch vessel irregularity bilaterally.  This has been made a PRA call report utilizing dashboard call  feature.  Original Report Authenticated By: Doug Sou, M.D.   Dg Chest Port 1 View  01/05/2012  *RADIOLOGY REPORT*  Clinical Data: Endotracheal tube placement.  PORTABLE CHEST - 1 VIEW  Comparison: Chest x-ray 01/04/2012.  Findings: An endotracheal tube is in place with tip 5.1 cm above the carina. Nasogastric tube extends into the proximal stomach. Lung volumes are lower limits of normal.  Minimal left lower lobe subsegmental atelectasis.  Possible trace left pleural effusion. Pulmonary vasculature is normal.  Cardiomediastinal silhouette is within normal limits.  IMPRESSION: 1.  Support apparatus, as above. 2.  Minimal left lower lobe subsegmental atelectasis and probable small left pleural effusion.  Original Report Authenticated By: Etheleen Mayhew, M.D.   Dg Chest Port 1 View  01/04/2012  *RADIOLOGY REPORT*  Clinical Data: Post procedure  PORTABLE CHEST - 1 VIEW  Comparison: 01/04/2012  Findings: Endotracheal tube tip unchanged in position, abutting the right margin of the trachea however located proximal to the carina. An NG tube descends into the abdomen, tip not visualized. Cardiomediastinal contours are unchanged.  Mild retrocardiac /lung base opacities. The lung apices are poorly visualized due to overlying structures.  No pneumothorax or pleural effusion.  No interval osseous change.  IMPRESSION: Endotracheal tube is unchanged in position.  Mild left greater than right lung base opacities, likely atelectasis.  Original Report Authenticated By: Suanne Marker, M.D.   Dg Chest Portable 1 View  01/04/2012  *RADIOLOGY REPORT*  Clinical Data: Endotracheal tube placement.  PORTABLE CHEST - 1 VIEW  Comparison: 03/20/2006.  Findings: Endotracheal tube is present with the tip 2.4 cm from the carina.  Lung volumes slightly low with basilar atelectasis.  No airspace disease.  No effusion. Monitoring leads are projected over the chest. Poor cardiopericardial silhouette within normal limits.   IMPRESSION: Endotracheal tube tip 24 mm from the carina.  Original Report Authenticated By: Dereck Ligas, M.D.    CT of the brain   IMPRESSION:  1. No acute intracranial abnormality.  2. Moderate generalized atrophy.  3. Old lacunar strokes in the thalami bilaterally and the right  basal ganglia.   CT angio  not ordered  MRI of the brain   IMPRESSION:  Several acute predominantly small non hemorrhagic infarcts span  throughout the right hemisphere involving posterior right frontal  lobe, right parietal lobe, right occipital lobe and posterior right  temporal lobe with small acute infarct at the junction of the right  thalamus and posterior limb of the right internal capsule. Largest  acute infarct medial aspect of the right occipital lobe.   MRA of the brain   IMPRESSION:  Left vertebral artery is occluded. There is a collateral flow  within the distal left vertebral artery and left PICA. Left PICA  is  narrowed and irregular.  Mild narrowing right vertebral artery without high-grade stenosis.  Small right PICA.  No high-grade stenosis of the basilar artery.  Nonvisualization right AICA and only small portion left AICA  visualized.  Moderate tandem stenosis of the superior cerebellar artery  bilaterally.  Moderate to marked narrowing of portions of the posterior cerebral  arteries most notable mid to distal aspect although seen proximally  as well.  Broad-based bulge of the proximal cavernous segment of the right  internal carotid artery may be related to the atherosclerotic type  changes although blister aneurysm not entirely excluded.  Mild narrowing of portions of the supraclinoid segment of the  internal carotid artery bilaterally.  Mild narrowing portions of the A1 segment of the anterior cerebral  artery bilaterally.  Middle cerebral artery and anterior cerebral artery branch vessel  irregularity bilaterally.   2D Echocardiogram  pending  Carotid Doppler   pending  CXR   IMPRESSION:  1. Support apparatus, as above.  2. Minimal left lower lobe subsegmental atelectasis and probable  small left pleural effusion.   EKG  pending  Physical Exam    The patient is intubated, sleepy but can be alerted.  The patient is not blinking to threat from either side, and he will not follow or track objects. The patient however, appears to be somewhat sleepy, and has to be realerted frequently.  The patient is able to move all 4 extremities, but the patient has drift involving the left lower extremity, not with the upper extremities.  The patient was not following commands for cerebellar testing, could not be ambulated.  Deep tendon reflexes are symmetric.   ASSESSMENT Chris Simpson is a 78 y.o. male with a right brain stroke, secondary to severe hypertension, rule out embolus. No home meds listed.  Stroke risk factors:  hypertension  Hospital day # 2  TREATMENT/PLAN  Continue Aspirin suppository 300 mg for secondary stroke prevention.    The patient presents with a seizure event followed by what appeared to be blindness. The clinical syndrome is consistent with PRES, associated with significant hypertension. MRI evaluation however, appears to show evidence of true infarcts in the right posterior cerebral artery distribution only. The infarcts are patchy, and could be embolic. Need to consider the possibility of a top of the basilar embolus.. Full stroke workup is indicated.  -MRA neck -2-D echocardiogram -Carotid Doppler study -Lipid panel -Urine drug screen -PT and OT and speech evaluation when appropriate -Rectal aspirin -Follow Dilantin levels  Stroke team will follow. The patient will need blood pressure management.    Lenor Coffin

## 2012-01-06 NOTE — Progress Notes (Signed)
Name: Chris Simpson MRN: [id number] DOB: 23-Dec-1931    LOS: 2 Date of admit: 01/04/2012  2:46 PM   Referring Provider:  ER  Reason for Referral:  Status Epilepticus / Resp Failure    PULMONARY / CRITICAL CARE MEDICINE  HPI:  78 y/o M with PMH of HTN who recently stopped taking his BP meds (per family) presented to [facility] ED on 8/9 with hypertensive urgency (inital pressure 210/110) / AMS / Code Stroke.  After CT scan of head he began having seizure activity and was intubated for airway protection.  Initial CT negative for stroke.  He was noted prior to intubation to have left sided gaze and lean.  PCCM consulted for ICU admit. On admit date of 01/04/2012     Events Since Admission: 8/9 - admit as CODE STROKE, seizures, intubated 8/10 MRI repeat shows stroke   SUBJECTIVE/OVERNIGHT/INTERVAL HX  Ddid well on PSV for 1h. Meets extubation criteria  BPand HR still high    Vital Signs: Temp:  [98.1 F (36.7 C)-100.8 F (38.2 C)] 100.8 F (38.2 C) (08/11 0845) Pulse Rate:  [51-118] 51  (08/11 0845) Resp:  [10-21] 17  (08/11 0845) BP: (90-218)/(55-127) 201/94 mmHg (08/11 0845) SpO2:  [95 %-100 %] 95 % (08/11 0845) Arterial Line BP: (147)/(52) 147/52 mmHg (08/10 1100) FiO2 (%):  [30 %-40 %] 30 % (08/11 0800)  Physical Examination: General:  wdwn adult male in NAD on vent Neuro: WYUA off sedation 2h - CAM-ICU near neg for delirium. RASS 0 to +2. Weak on one side HEENT:  Mm pink/moist, OETT Cardiovascular:  s1s2 rrr, distant tones Lungs:  resp's even/non-labored, lungs bilaterally coarse rhonchi Abdomen:  Flat, soft, bsx4 active Musculoskeletal:  No acute deformities Skin:  Warm/dry   Ct Head Wo Contrast  01/04/2012  *RADIOLOGY REPORT*  Clinical Data: New onset seizures.  Aphasia.  Code stroke.  CT HEAD WITHOUT CONTRAST  Technique:  Contiguous axial images were obtained from the base of the skull through the vertex without contrast.  Comparison: None.  Findings: Moderate cortical and  mild deep atrophy.  Moderate cerebellar atrophy.  Old lacunar strokes in the thalami bilaterally and dating right basal ganglia.  No mass lesion.  No midline shift. No acute hemorrhage or hematoma.  No extra-axial fluid collections. No evidence of acute infarction.  No skull fracture or other focal osseous abnormality involving the skull.  Visualized paranasal sinuses, bilateral mastoid air cells, and bilateral middle ear cavities well-aerated.  Moderate bilateral carotid siphon atherosclerosis.  IMPRESSION:  1.  No acute intracranial abnormality. 2.  Moderate generalized atrophy. 3.  Old lacunar strokes in the thalami bilaterally and the right basal ganglia.  These results were called by telephone on 01/04/2012 at 1500 hours to Dr. Jeanell Sparrow of the emergency department, who verbally acknowledged these results.  Original Report Authenticated By: Deniece Portela, M.D.   Mr [facility] Wo Contrast  01/05/2012  *RADIOLOGY REPORT*  Clinical Data:  Hypertensive patient is presenting with hypertensive urgency.  Seizure-like activity.  MRI BRAIN WITHOUT CONTRAST MRA HEAD WITHOUT CONTRAST  Technique: Multiplanar, multiecho pulse sequences of the brain and surrounding structures were obtained according to standard protocol without intravenous contrast.  Angiographic images of the head were obtained using MRA technique without contrast.  Comparison: 01/04/2012 head CT.  No comparison brain MR.  MRI HEAD  Findings:  Several acute predominantly small non hemorrhagic infarcts span throughout the right hemisphere involving posterior right frontal lobe, right parietal lobe, right occipital lobe and posterior  right temporal lobe with small acute infarct at the junction of the right thalamus and posterior limb of the right internal capsule. Largest acute infarct medial aspect of the right occipital lobe.  No intracranial hemorrhage.  Remote right corona radiata infarcts.  Small remote left thalamic and basal ganglia infarcts.  Remote  small right cerebellar infarct.  Moderate small vessel disease type changes.  Global atrophy without hydrocephalus.  8 x 7.9 x 5.6 mm mass projects from the right pituitary gland to the undersurface of the optic nerves/optic chiasm.  Etiology indeterminate.  This may represent a primary pituitary microadenoma with suprasellar extension although a Rathke cleft cyst or a craniopharyngioma are not entirely excluded.  Paranasal sinus mucosal thickening / opacification with air fluid level/polypoid opacification right maxillary sinus.  Cervical spondylotic changes with spinal stenosis and mild cord flattening C3-4 and C4-5.  Prominence of the superior ophthalmic veins slightly more notable on the left.  No obvious abnormality of the cavernous sinus detected as contributing to this finding.  IMPRESSION:  Several acute predominantly small non hemorrhagic infarcts span throughout the right hemisphere involving posterior right frontal lobe, right parietal lobe, right occipital lobe and posterior right temporal lobe with small acute infarct at the junction of the right thalamus and posterior limb of the right internal capsule. Largest acute infarct medial aspect of the right occipital lobe.  No intracranial hemorrhage.  Remote right corona radiata infarcts.  Small remote left thalamic and basal ganglia infarcts.  Remote small right cerebellar infarct.  Moderate small vessel disease type changes.  Global atrophy without hydrocephalus.  8 x 7.9 x 5.6 mm mass projects from the right pituitary gland to the undersurface of the optic nerves/optic chiasm.  Etiology indeterminate.  This may represent a primary pituitary microadenoma with suprasellar extension although a Rathke cleft cyst or a craniopharyngioma are not entirely excluded.  Paranasal sinus mucosal thickening / opacification with air fluid level/polypoid opacification right maxillary sinus.  Cervical spondylotic changes with spinal stenosis and mild cord flattening C3-4  and C4-5.  MRA HEAD  Findings: Left vertebral artery is occluded.  There is a collateral flow within the distal left vertebral artery and left PICA. Left PICA is narrowed and irregular.  Mild narrowing right vertebral artery without high-grade stenosis. Small right PICA.  No high-grade stenosis of the basilar artery.  Nonvisualization right AICA and only small portion left AICA visualized.  Moderate tandem stenosis of the superior cerebellar artery bilaterally.  Moderate to marked narrowing of portions of the posterior cerebral arteries most notable mid to distal aspect although seen proximally as well.  Broad-based bulge of the proximal cavernous segment of the right internal carotid artery may be related to the atherosclerotic type changes although blister aneurysm not entirely excluded.  Mild narrowing of portions of the supraclinoid segment of the internal carotid artery bilaterally.  Mild narrowing portions of the A1 segment of the anterior cerebral artery bilaterally.  Middle cerebral artery and anterior cerebral artery branch vessel irregularity bilaterally.  No obvious aneurysm or vascular malformation.  IMPRESSION:  Left vertebral artery is occluded.  There is a collateral flow within the distal left vertebral artery and left PICA. Left PICA is narrowed and irregular.  Mild narrowing right vertebral artery without high-grade stenosis. Small right PICA.  No high-grade stenosis of the basilar artery.  Nonvisualization right AICA and only small portion left AICA visualized.  Moderate tandem stenosis of the superior cerebellar artery bilaterally.  Moderate to marked narrowing of portions of the posterior  cerebral arteries most notable mid to distal aspect although seen proximally as well.  Broad-based bulge of the proximal cavernous segment of the right internal carotid artery may be related to the atherosclerotic type changes although blister aneurysm not entirely excluded.  Mild narrowing of portions of the  supraclinoid segment of the internal carotid artery bilaterally.  Mild narrowing portions of the A1 segment of the anterior cerebral artery bilaterally.  Middle cerebral artery and anterior cerebral artery branch vessel irregularity bilaterally.  This has been made a PRA call report utilizing dashboard call feature.  Original Report Authenticated By: Doug Sou, M.D.   Mr Brain Wo Contrast  01/05/2012  *RADIOLOGY REPORT*  Clinical Data:  Hypertensive patient is presenting with hypertensive urgency.  Seizure-like activity.  MRI BRAIN WITHOUT CONTRAST MRA HEAD WITHOUT CONTRAST  Technique: Multiplanar, multiecho pulse sequences of the brain and surrounding structures were obtained according to standard protocol without intravenous contrast.  Angiographic images of the head were obtained using MRA technique without contrast.  Comparison: 01/04/2012 head CT.  No comparison brain MR.  MRI HEAD  Findings:  Several acute predominantly small non hemorrhagic infarcts span throughout the right hemisphere involving posterior right frontal lobe, right parietal lobe, right occipital lobe and posterior right temporal lobe with small acute infarct at the junction of the right thalamus and posterior limb of the right internal capsule. Largest acute infarct medial aspect of the right occipital lobe.  No intracranial hemorrhage.  Remote right corona radiata infarcts.  Small remote left thalamic and basal ganglia infarcts.  Remote small right cerebellar infarct.  Moderate small vessel disease type changes.  Global atrophy without hydrocephalus.  8 x 7.9 x 5.6 mm mass projects from the right pituitary gland to the undersurface of the optic nerves/optic chiasm.  Etiology indeterminate.  This may represent a primary pituitary microadenoma with suprasellar extension although a Rathke cleft cyst or a craniopharyngioma are not entirely excluded.  Paranasal sinus mucosal thickening / opacification with air fluid level/polypoid  opacification right maxillary sinus.  Cervical spondylotic changes with spinal stenosis and mild cord flattening C3-4 and C4-5.  Prominence of the superior ophthalmic veins slightly more notable on the left.  No obvious abnormality of the cavernous sinus detected as contributing to this finding.  IMPRESSION:  Several acute predominantly small non hemorrhagic infarcts span throughout the right hemisphere involving posterior right frontal lobe, right parietal lobe, right occipital lobe and posterior right temporal lobe with small acute infarct at the junction of the right thalamus and posterior limb of the right internal capsule. Largest acute infarct medial aspect of the right occipital lobe.  No intracranial hemorrhage.  Remote right corona radiata infarcts.  Small remote left thalamic and basal ganglia infarcts.  Remote small right cerebellar infarct.  Moderate small vessel disease type changes.  Global atrophy without hydrocephalus.  8 x 7.9 x 5.6 mm mass projects from the right pituitary gland to the undersurface of the optic nerves/optic chiasm.  Etiology indeterminate.  This may represent a primary pituitary microadenoma with suprasellar extension although a Rathke cleft cyst or a craniopharyngioma are not entirely excluded.  Paranasal sinus mucosal thickening / opacification with air fluid level/polypoid opacification right maxillary sinus.  Cervical spondylotic changes with spinal stenosis and mild cord flattening C3-4 and C4-5.  MRA HEAD  Findings: Left vertebral artery is occluded.  There is a collateral flow within the distal left vertebral artery and left PICA. Left PICA is narrowed and irregular.  Mild narrowing right vertebral artery without  high-grade stenosis. Small right PICA.  No high-grade stenosis of the basilar artery.  Nonvisualization right AICA and only small portion left AICA visualized.  Moderate tandem stenosis of the superior cerebellar artery bilaterally.  Moderate to marked narrowing of  portions of the posterior cerebral arteries most notable mid to distal aspect although seen proximally as well.  Broad-based bulge of the proximal cavernous segment of the right internal carotid artery may be related to the atherosclerotic type changes although blister aneurysm not entirely excluded.  Mild narrowing of portions of the supraclinoid segment of the internal carotid artery bilaterally.  Mild narrowing portions of the A1 segment of the anterior cerebral artery bilaterally.  Middle cerebral artery and anterior cerebral artery branch vessel irregularity bilaterally.  No obvious aneurysm or vascular malformation.  IMPRESSION:  Left vertebral artery is occluded.  There is a collateral flow within the distal left vertebral artery and left PICA. Left PICA is narrowed and irregular.  Mild narrowing right vertebral artery without high-grade stenosis. Small right PICA.  No high-grade stenosis of the basilar artery.  Nonvisualization right AICA and only small portion left AICA visualized.  Moderate tandem stenosis of the superior cerebellar artery bilaterally.  Moderate to marked narrowing of portions of the posterior cerebral arteries most notable mid to distal aspect although seen proximally as well.  Broad-based bulge of the proximal cavernous segment of the right internal carotid artery may be related to the atherosclerotic type changes although blister aneurysm not entirely excluded.  Mild narrowing of portions of the supraclinoid segment of the internal carotid artery bilaterally.  Mild narrowing portions of the A1 segment of the anterior cerebral artery bilaterally.  Middle cerebral artery and anterior cerebral artery branch vessel irregularity bilaterally.  This has been made a PRA call report utilizing dashboard call feature.  Original Report Authenticated By: Doug Sou, M.D.   Dg Chest Port 1 View  01/05/2012  *RADIOLOGY REPORT*  Clinical Data: Endotracheal tube placement.  PORTABLE CHEST - 1  VIEW  Comparison: Chest x-ray 01/04/2012.  Findings: An endotracheal tube is in place with tip 5.1 cm above the carina. Nasogastric tube extends into the proximal stomach. Lung volumes are lower limits of normal.  Minimal left lower lobe subsegmental atelectasis.  Possible trace left pleural effusion. Pulmonary vasculature is normal.  Cardiomediastinal silhouette is within normal limits.  IMPRESSION: 1.  Support apparatus, as above. 2.  Minimal left lower lobe subsegmental atelectasis and probable small left pleural effusion.  Original Report Authenticated By: Etheleen Mayhew, M.D.   Dg Chest Port 1 View  01/04/2012  *RADIOLOGY REPORT*  Clinical Data: Post procedure  PORTABLE CHEST - 1 VIEW  Comparison: 01/04/2012  Findings: Endotracheal tube tip unchanged in position, abutting the right margin of the trachea however located proximal to the carina. An NG tube descends into the abdomen, tip not visualized. Cardiomediastinal contours are unchanged.  Mild retrocardiac /lung base opacities. The lung apices are poorly visualized due to overlying structures.  No pneumothorax or pleural effusion.  No interval osseous change.  IMPRESSION: Endotracheal tube is unchanged in position.  Mild left greater than right lung base opacities, likely atelectasis.  Original Report Authenticated By: Suanne Marker, M.D.   Dg Chest Portable 1 View  01/04/2012  *RADIOLOGY REPORT*  Clinical Data: Endotracheal tube placement.  PORTABLE CHEST - 1 VIEW  Comparison: 03/20/2006.  Findings: Endotracheal tube is present with the tip 2.4 cm from the carina.  Lung volumes slightly low with basilar atelectasis.  No airspace  disease.  No effusion. Monitoring leads are projected over the chest. Poor cardiopericardial silhouette within normal limits.  IMPRESSION: Endotracheal tube tip 24 mm from the carina.  Original Report Authenticated By: Dereck Ligas, M.D.     Principal Problem:  *Hypertensive emergency Active Problems:  Acute  respiratory failure  Seizure  Altered mental status  Hypokalemia  CRI (chronic renal insufficiency)   ASSESSMENT AND PLAN  PULMONARY  Lab 01/04/12 2033 01/04/12 1702  PHART 7.337* 7.347*  PCO2ART 41.6 48.3*  PO2ART 136.0* 528.0*  HCO3 22.0 26.5*  O2SAT 98.7 100.0   Ventilator Settings: Vent Mode:  [-] PSV;CPAP FiO2 (%):  [30 %-40 %] 30 % Set Rate:  [14 bmp] 14 bmp Vt Set:  [560 mL] 560 mL PEEP:  [5 cmH20] 5 cmH20 Pressure Support:  [10 cmH20] 10 cmH20 Plateau Pressure:  [13 cmH20-19 cmH20] 13 cmH20  CXR:  01/05/12 - similar to 01/04/12 ETT:  8/9 >>>(8/11 planned)  A:  Acute Respiratory Failure-in setting of status epilepticus - on 01/06/12  Meets  extubation crituieria   P:   - extubate -combivent  CARDIOVASCULAR  Lab 01/04/12 2013 01/04/12 1445  TROPONINI -- <0.30  LATICACIDVEN 4.0* --  PROBNP 547.8* --   ECG:   Lines:    A:  Hypertensive Emergency - started cardene 01/04/12   Ion 01/05/12 - noticed spont bp drop and cardene stpped  - on 01/06/12 - HR 120 and BP MAP 130 with SBP 200s (noted that he was non compliant with diovan/hctz at home)  P:  - neuro wants sbp goal 140-180; start lopressor and hydralaizine prn   NEUROLOGIC 01/05/12 and 01/06/12 - flaccid on on one side. Neuro feels PRES and Hypetension clinically but MRI has true small infarcts on right PCA territotory and embolic stroke suspected  A:   PRES Hypertensive ENcephalopathy Acute R PCA area infarcts - ? Embolic  P:   -bp goal [id number] per neurology - aspirin - -neuro checks and neuro cx workup - see their note 8/11   RENAL  Lab 01/05/12 0533 01/04/12 2013 01/04/12 1502 01/04/12 1458  NA 143 137 139 140  K 3.8 3.1* -- --  CL 107 102 100 102  CO2 24 22 20  --  BUN 12 11 12 12   CREATININE 1.40* 1.35 1.48* 1.50*  CALCIUM 8.3* 8.2* 9.1 --  MG 1.6 1.7 1.8 --  PHOS 1.7* 2.3 2.8 --   Intake/Output      08/10 0701 - 08/11 0700 08/11 0701 - 08/12 0700   I.V. (mL/kg) 2894.3 (39.3) 100  (1.4)   NG/GT 50 30   IV Piggyback 377    Total Intake(mL/kg) 3321.3 (45.1) 130 (1.8)   Urine (mL/kg/hr) 1370 (0.8) 700   Total Output 1370 700   Net +1951.3 -570         Foley:  8/9>>>  A:   Hypokalemia, Hypomag, Hypophos on 01/05/12 Acute Renal Insufficiency  - mild on 01/05/12  P:   -recheck labs 8/11  GASTROINTESTINAL  Lab 01/05/12 0533 01/04/12 2013 01/04/12 1502  AST -- 18 16  ALT -- 8 8  ALKPHOS -- 82 84  BILITOT -- 0.5 0.3  PROT -- 7.1 7.7  ALBUMIN 2.8* 3.2* 3.5    A:  NPO after extubation P:   - NPO after extubation; call speech cx   HEMATOLOGIC  Lab 01/05/12 0533 01/04/12 2013 01/04/12 1502 01/04/12 1458  HGB 13.7 14.1 14.8 16.7  HCT 42.1 43.0 45.6 49.0  PLT 187 220 271 --  INR -- 1.14 1.11 --  APTT -- 30 29 --   A:   No acute issues  P:  -monitor CBC  INFECTIOUS  Lab 01/05/12 0533 01/04/12 2013 01/04/12 1744 01/04/12 1502  WBC 10.9* 13.6* -- 12.9*  PROCALCITON -- -- <0.10 --   Cultures: 8/9 Sputum>>> 8/9 UA>>> 8/9 BCx2>>> 8/9 UC>>>    Antibiotics: 8/9 Unasyn>>>  A:   Concern for aspiration PNA  P:   -empiric coverage for aspiration -follow cultures - rechek PCT 01/07/12 and dc abx if still low  ENDOCRINE  Lab 01/04/12 1509  GLUCAP 155*   A:   Hyperglycemia   P:   -CBG Q4 while NPO    Patient is critically ill with multiple organ failure and highg risk of death or organ failure deterioration. Critical care time 35 minutes  Dr. Brand Males, M.D., [facility].C.P Pulmonary and Critical Care Medicine Staff Physician [place] [facility] Pager: [phone], If no answer or between  15:00h - 7:00h: call [phone]  01/06/2012 8:58 AM

## 2012-01-06 NOTE — Procedures (Signed)
Extubation Procedure Note  Patient Details:   Name: Chris Simpson DOB: 1931/09/24 MRN: [id number]   Airway Documentation:  Airway 7.5 mm (Active)  Secured at (cm) 23 cm 01/06/2012  7:51 AM  Measured From Lips 01/06/2012  7:51 AM  Secured Location Right 01/06/2012  8:00 AM  Secured By [employer] 01/06/2012  8:00 AM  Tube Holder Repositioned Yes 01/06/2012  3:56 AM  Cuff Pressure (cm H2O) 22 cm H2O 01/05/2012 12:12 PM  Site Condition Dry 01/06/2012  7:51 AM    Evaluation  O2 sats: stable throughout Complications: No apparent complications Patient did tolerate procedure well. Bilateral Breath Sounds: Clear;Diminished Suctioning: Airway Yes.  Pt alert and very anxious.  Tube pulled and placed on 4L O2.    Tannah Dreyfuss V 01/06/2012, 9:08 AM

## 2012-01-06 NOTE — Progress Notes (Signed)
SG 1.006 Viona Gilmore [facility] 01/06/2012 11:52 AM

## 2012-01-06 NOTE — Progress Notes (Signed)
Contacted Dr. Chase Caller with PCCM and Dr. Jannifer Franklin with Stroke regarding the patient's urinary output of 350 mL per hour for three hours and specific gravity of 1.007.  Fluids turned down to [facility] per order.  Will continue to monitor patient and update as needed.  Viona Gilmore Deanne 01/06/2012 9:30 AM

## 2012-01-06 NOTE — Progress Notes (Signed)
Wasted 28 mL of fentanyl in the sink with Gregery Na, RN.  Viona Gilmore [facility] 01/06/2012 9:56 AM

## 2012-01-06 NOTE — Progress Notes (Signed)
Patient agitated and combative when Propofol is less than 5 mcg/kg/min. Patient BP is lower than parameters when Propofol is above 5 mcg/kg/min.  Dr. Lake Bells [facility]) called and decision made to continue to titrate Propofol and Fentanyl to patients comfort and try to maintain BP between 150-180. RN will continue to assess.

## 2012-01-07 ENCOUNTER — Inpatient Hospital Stay (HOSPITAL_COMMUNITY): Payer: Medicare Other

## 2012-01-07 DIAGNOSIS — R4182 Altered mental status, unspecified: Secondary | ICD-10-CM | POA: Diagnosis not present

## 2012-01-07 DIAGNOSIS — R569 Unspecified convulsions: Secondary | ICD-10-CM | POA: Diagnosis not present

## 2012-01-07 DIAGNOSIS — I6509 Occlusion and stenosis of unspecified vertebral artery: Secondary | ICD-10-CM | POA: Diagnosis not present

## 2012-01-07 DIAGNOSIS — I10 Essential (primary) hypertension: Secondary | ICD-10-CM | POA: Diagnosis not present

## 2012-01-07 DIAGNOSIS — I6789 Other cerebrovascular disease: Secondary | ICD-10-CM | POA: Diagnosis not present

## 2012-01-07 DIAGNOSIS — I639 Cerebral infarction, unspecified: Secondary | ICD-10-CM | POA: Diagnosis present

## 2012-01-07 DIAGNOSIS — I749 Embolism and thrombosis of unspecified artery: Secondary | ICD-10-CM | POA: Diagnosis not present

## 2012-01-07 DIAGNOSIS — I6350 Cerebral infarction due to unspecified occlusion or stenosis of unspecified cerebral artery: Secondary | ICD-10-CM | POA: Diagnosis not present

## 2012-01-07 LAB — PHENYTOIN LEVEL, TOTAL: Phenytoin Lvl: 19.5 ug/mL (ref 10.0–20.0)

## 2012-01-07 LAB — URINE DRUGS OF ABUSE SCREEN W ALC, ROUTINE (REF LAB)
Amphetamine Screen, Ur: NEGATIVE
Cocaine Metabolites: NEGATIVE
Ethyl Alcohol: <10 mg/dL (ref <10)
Opiate Screen, Urine: NEGATIVE
Propoxyphene: NEGATIVE

## 2012-01-07 LAB — LIPID PANEL
Cholesterol: 156 mg/dL (ref 0–200)
LDL Cholesterol: 90 mg/dL (ref 0–99)
Total CHOL/HDL Ratio: 3.5 RATIO
VLDL: 21 mg/dL (ref 0–40)

## 2012-01-07 LAB — BASIC METABOLIC PANEL
BUN: 17 mg/dL (ref 6–23)
CO2: 20 mEq/L (ref 19–32)
Chloride: 105 mEq/L (ref 96–112)
Creatinine, Ser: 1.49 mg/dL — ABNORMAL HIGH (ref 0.50–1.35)
GFR calc Af Amer: 50 mL/min — ABNORMAL LOW (ref >90)

## 2012-01-07 LAB — GLUCOSE, CAPILLARY: Glucose-Capillary: 124 mg/dL — ABNORMAL HIGH (ref 70–99)

## 2012-01-07 MED ORDER — INSULIN ASPART 100 UNIT/ML ~~LOC~~ SOLN
0.0000 [IU] | Freq: Three times a day (TID) | SUBCUTANEOUS | Status: DC
  Administered 2012-01-07: 3 [IU] via SUBCUTANEOUS
  Administered 2012-01-08: 13:00:00 via SUBCUTANEOUS

## 2012-01-07 MED ORDER — HEPARIN SODIUM (PORCINE) 5000 UNIT/ML IJ SOLN
5000.0000 [IU] | Freq: Three times a day (TID) | INTRAMUSCULAR | Status: DC
  Administered 2012-01-07 – 2012-01-09 (×7): 5000 [IU] via SUBCUTANEOUS
  Filled 2012-01-07 (×9): qty 1

## 2012-01-07 MED ORDER — LABETALOL HCL 200 MG PO TABS
200.0000 mg | ORAL_TABLET | Freq: Two times a day (BID) | ORAL | Status: DC
  Administered 2012-01-07 – 2012-01-09 (×5): 200 mg via ORAL
  Filled 2012-01-07 (×7): qty 1

## 2012-01-07 MED ORDER — NICARDIPINE HCL IN NACL 20-0.86 MG/200ML-% IV SOLN
5.0000 mg/h | INTRAVENOUS | Status: DC
  Administered 2012-01-07: 2.5 mg/h via INTRAVENOUS
  Administered 2012-01-07: 5 mg/h via INTRAVENOUS
  Filled 2012-01-07: qty 200

## 2012-01-07 MED ORDER — ASPIRIN 325 MG PO TABS
325.0000 mg | ORAL_TABLET | Freq: Every day | ORAL | Status: DC
  Administered 2012-01-07 – 2012-01-09 (×3): 325 mg via ORAL
  Filled 2012-01-07 (×3): qty 1

## 2012-01-07 MED ORDER — GADOBENATE DIMEGLUMINE 529 MG/ML IV SOLN
15.0000 mL | Freq: Once | INTRAVENOUS | Status: AC
  Administered 2012-01-07: 15 mL via INTRAVENOUS

## 2012-01-07 MED ORDER — INSULIN ASPART 100 UNIT/ML ~~LOC~~ SOLN
0.0000 [IU] | Freq: Every day | SUBCUTANEOUS | Status: DC
  Administered 2012-01-07: 2 [IU] via SUBCUTANEOUS

## 2012-01-07 MED ORDER — LABETALOL HCL 5 MG/ML IV SOLN: 20.0000 mg | INTRAVENOUS | Status: DC | PRN

## 2012-01-07 NOTE — Progress Notes (Signed)
ANTIBIOTIC CONSULT NOTE - FOLLOW UP  Pharmacy Consult for Unasyn Indication: r/o asp pna  No Known Allergies  Patient Measurements: Height: 5\' 9"  (175.3 cm) Weight: 134 lb 7.7 oz (61 kg) IBW/kg (Calculated) : 70.7   Vital Signs: Temp: 99.3 F (37.4 C) (08/12 0915) Temp src: Core (Comment) (08/12 0800) BP: 159/74 mmHg (08/12 0915) Pulse Rate: 93  (08/12 0915) Intake/Output from previous day: 08/11 0701 - 08/12 0700 In: 772.7 [I.V.:634.7; NG/GT:30; IV Piggyback:108] Out: [id number] [Urine:4945] Intake/Output from this shift: Total I/O In: 100 [I.V.:50; IV Piggyback:50] Out: 320 [Urine:320]  Labs:  [facility] 01/07/12 0521 01/06/12 1412 01/06/12 0908 01/05/12 0533 01/04/12 2013 01/04/12 1502  WBC -- -- -- 10.9* 13.6* 12.9*  HGB -- -- -- 13.7 14.1 14.8  PLT -- -- -- 187 220 271  LABCREA -- -- -- -- -- --  CREATININE 1.49* 1.48* 1.52* -- -- --   Estimated Creatinine Clearance: 35.3 ml/min (by C-G formula based on Cr of 1.49). No results found for this basename: VANCOTROUGH:2,VANCOPEAK:2,VANCORANDOM:2,GENTTROUGH:2,GENTPEAK:2,GENTRANDOM:2,TOBRATROUGH:2,TOBRAPEAK:2,TOBRARND:2,AMIKACINPEAK:2,AMIKACINTROU:2,AMIKACIN:2, in the last 72 hours   Microbiology: Recent Results (from the past 720 hour(s))  URINE CULTURE     Status: Normal   Collection Time   01/04/12  3:44 PM      Component Value Range Status Comment   Specimen Description URINE, CATHETERIZED   Final    Special Requests ADDED [id number]   Final    Culture  Setup Time 01/05/2012 01:57   Final    Colony Count NO GROWTH   Final    Culture NO GROWTH   Final    Report Status 01/06/2012 FINAL   Final   CULTURE, BLOOD (ROUTINE X 2)     Status: Normal (Preliminary result)   Collection Time   01/04/12  6:00 PM      Component Value Range Status Comment   Specimen Description BLOOD HAND LEFT   Final    Special Requests BOTTLES DRAWN AEROBIC ONLY 5CC   Final    Culture  Setup Time 01/05/2012 02:13   Final    Culture     Final    Value:        BLOOD CULTURE RECEIVED NO GROWTH TO DATE CULTURE WILL BE HELD FOR 5 DAYS BEFORE ISSUING A FINAL NEGATIVE REPORT   Report Status PENDING   Incomplete   CULTURE, BLOOD (ROUTINE X 2)     Status: Normal (Preliminary result)   Collection Time   01/04/12  6:10 PM      Component Value Range Status Comment   Specimen Description BLOOD HAND LEFT   Final    Special Requests BOTTLES DRAWN AEROBIC ONLY 3CC   Final    Culture  Setup Time 01/05/2012 02:13   Final    Culture     Final    Value:        BLOOD CULTURE RECEIVED NO GROWTH TO DATE CULTURE WILL BE HELD FOR 5 DAYS BEFORE ISSUING A FINAL NEGATIVE REPORT   Report Status PENDING   Incomplete   MRSA PCR SCREENING     Status: Normal   Collection Time   01/04/12  8:28 PM      Component Value Range Status Comment   MRSA by PCR NEGATIVE  NEGATIVE Final     Anti-infectives     Start     Dose/Rate Route Frequency Ordered Stop   01/04/12 2200   ampicillin-sulbactam (UNASYN) 1.5 g in sodium chloride 0.9 % 50 mL IVPB  Status:  Discontinued  1.5 g 100 mL/hr over 30 Minutes Intravenous Every 6 hours 01/04/12 2102 01/07/12 1100          Assessment: 78 y/o male patient with subacute stroke receiving day #3 unasyn for empiric treatment of aspiration pneumonia. Tmax 100.4, currently afebrile and all cx ngtd.   Plan:  Unasyn dosed appropriately at 1.5g IV q6h given renal function. Will continue to follow.   Davonna Belling, PharmD, BCPS Pager [phone] 01/07/2012,11:03 AM

## 2012-01-07 NOTE — Progress Notes (Signed)
Stroke Team Progress Note  HISTORY Chris Simpson is an 78 y.o. male with HTN. Patient was with his nephew eating at a restaurant at 13:50 on 01/04/2012 when patient noted a flashing light in his vision. While he was in the restaurant he collapsed and had notable seizure. Patient was emergently brought to the ED as a code stroke. Initial CT head was negative for stroke. Initial BP was 210/110 via EMS. While in ED his BP dropped and HR ranged between 30-40. After patient was brought back to Trauma C patient began to show focal left sided seizure which became generalized. Patient was emergently intubated and given total of 4 mg Ativan. Dilantin was ordered.   SUBJECTIVE Doing well. No complaints. No headache, pain or issues. No questions. Denies EtOH use in past. Was moking cigarettes. Living alone PTA. No regular PCP.   OBJECTIVE Filed Vitals:   01/07/12 0715 01/07/12 0730 01/07/12 0800 01/07/12 0915  BP: 149/69 163/84 171/97 159/74  Pulse: 102 103 87 93  Temp: 99.5 F (37.5 C) 99.5 F (37.5 C) 99.3 F (37.4 C) 99.3 F (37.4 C)  TempSrc:   Core (Comment)   Resp: 18 20 15 16   Height:      Weight:      SpO2: 99% 99% 99% 98%   CBG (last 3)  Basename 01/04/12 1509  GLUCAP 155*   Intake/Output from previous day: 08/11 0701 - 08/12 0700 In: 772.7 [I.V.:634.7; NG/GT:30; IV Piggyback:108] Out: 4945 [Urine:4945]  IV Fluid Intake:     . sodium chloride 20 mL/hr at 01/07/12 0800  . niCARDipine 2 mg/hr (01/07/12 0900)   Medications    . ampicillin-sulbactam (UNASYN) IV  1.5 g Intravenous Q6H  . antiseptic oral rinse  15 mL Mouth Rinse BID  . aspirin  300 mg Rectal Daily  . heparin subcutaneous  5,000 Units Subcutaneous Q8H  . pantoprazole (PROTONIX) IV  40 mg Intravenous Q24H  . phenytoin (DILANTIN) IV  100 mg Intravenous Q8H  . DISCONTD: antiseptic oral rinse  15 mL Mouth Rinse QID  . DISCONTD: chlorhexidine  15 mL Mouth Rinse BID  PRN acetaminophen (TYLENOL) oral liquid 160 mg/5 mL,  acetaminophen, albuterol-ipratropium, hydrALAZINE, metoprolol, DISCONTD: acetaminophen (TYLENOL) oral liquid 160 mg/5 mL  Diet:  Dysphagia 1 nectar thick liquids Activity:  Bedrest, up in chair as tolerated DVT Prophylaxis:  Subcutaneous heparin  Significant Diagnostic Studies: CBC    Component Value Date/Time   WBC 10.9* 01/05/2012 0533   RBC 4.38 01/05/2012 0533   HGB 13.7 01/05/2012 0533   HCT 42.1 01/05/2012 0533   PLT 187 01/05/2012 0533   MCV 96.1 01/05/2012 0533   MCH 31.3 01/05/2012 0533   MCHC 32.5 01/05/2012 0533   RDW 15.1 01/05/2012 0533   LYMPHSABS 1.9 01/04/2012 2013   MONOABS 1.0 01/04/2012 2013   EOSABS 0.0 01/04/2012 2013   BASOSABS 0.0 01/04/2012 2013   CMP    Component Value Date/Time   NA 140 01/07/2012 0521   K 4.1 01/07/2012 0521   CL 105 01/07/2012 0521   CO2 20 01/07/2012 0521   GLUCOSE 150* 01/07/2012 0521   BUN 17 01/07/2012 0521   CREATININE 1.49* 01/07/2012 0521   CALCIUM 9.3 01/07/2012 0521   PROT 7.1 01/04/2012 2013   ALBUMIN 2.8* 01/05/2012 0533   AST 18 01/04/2012 2013   ALT 8 01/04/2012 2013   ALKPHOS 82 01/04/2012 2013   BILITOT 0.5 01/04/2012 2013   GFRNONAA 43* 01/07/2012 0521   GFRAA 50* 01/07/2012 [id number]  COAGS Lab Results  Component Value Date   INR 1.14 01/04/2012   INR 1.11 01/04/2012   Lipid Panel     Component Value Date/Time   CHOL 156 01/07/2012 0521   TRIG 104 01/07/2012 0521   HDL 45 01/07/2012 0521   CHOLHDL 3.5 01/07/2012 0521   VLDL 21 01/07/2012 0521   LDLCALC 90 01/07/2012 0521   HgbA1C  No results found for this basename: HGBA1C   Urine Drug Screen  No results found for this basename: labopia,  cocainscrnur,  labbenz,  amphetmu,  thcu,  labbarb    Alcohol Level    Component Value Date/Time   [facility] <11 01/04/2012 1548     CT of the brain  01/04/2012 1. No acute intracranial abnormality. 2. Moderate generalized atrophy.  3. Old lacunar strokes in the thalami bilaterally and the right basal ganglia.  MRI of the brain  01/05/2012 Several acute  predominantly small non hemorrhagic infarcts span throughout the right hemisphere involving posterior right frontal lobe, right parietal lobe, right occipital lobe and posterior right temporal lobe with small acute infarct at the junction of the right thalamus and posterior limb of the right internal capsule. Largest acute infarct medial aspect of the right occipital lobe.  MRA of the brain  01/05/2012 Left vertebral artery is occluded. There is a collateral flow within the distal left vertebral artery and left PICA. Left PICA is narrowed and irregular. Mild narrowing right vertebral artery without high-grade stenosis. Small right PICA. No high-grade stenosis of the basilar artery. Nonvisualization right AICA and only small portion left AICA visualized. Moderate tandem stenosis of the superior cerebellar artery bilaterally. Moderate to marked narrowing of portions of the posterior cerebral arteries most notable mid to distal aspect although seen proximally as well. Broad-based bulge of the proximal cavernous segment of the right internal carotid artery may be related to the atherosclerotic type changes although blister aneurysm not entirely excluded. Mild narrowing of portions of the supraclinoid segment of the  internal carotid artery bilaterally. Mild narrowing portions of the A1 segment of the anterior cerebral artery bilaterally. Middle cerebral artery and anterior cerebral artery branch vessel irregularity bilaterally.  2D Echocardiogram   Carotid Doppler   CXR  01/05/2012 1. Support apparatus, as above. 2. Minimal left lower lobe subsegmental atelectasis and probable small left pleural effusion.  EKG  Sinus tachycardia  Physical Exam   GENERAL EXAM: Patient is in no distress  CARDIOVASCULAR: TACHYCARDIA. Regular rhythm, no murmurs, no carotid bruits  NEUROLOGIC: MENTAL STATUS: awake, alert, language fluent, comprehension intact, naming intact CRANIAL NERVE: pupils equal and reactive to  light, visual fields full to confrontation, able to count fingers. No extinction. Extraocular muscles intact, no nystagmus, facial sensation and strength symmetric, uvula midline, shoulder shrug symmetric, tongue midline. MOTOR: normal bulk and tone, full strength in the BUE, BLE SENSORY: normal and symmetric to light touch, pinprick, temperature, vibration and proprioception COORDINATION: finger-nose-finger, fine finger movements, heel-shin normal REFLEXES: deep tendon reflexes present and symmetric GAIT/STATION: narrow based gait; able to walk on toes, heels and tandem; romberg is negative   ASSESSMENT Chris Simpson is a 78 y.o. male who presented with malignant HTN, seizure and blindness. 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  MRI showing scattered infarcts throughout the right hemisphere (could be in spectrum of PRES or cardioembolic). Full stroke workup is indicated. On aspirin suppository 300 mg daily for secondary stroke prevention.   -  Seizure - continue dilantin - last level 19.5 -Malignant hypertension. Remains on cardene. Taking pos but not on po BP meds -cigarette smoker  Hospital day # 3  TREATMENT/PLAN -MRA neck -2-D echocardiogram -HgbA1c -Urine drug screen -PT and OT and speech evaluation when appropriate -change aspirin to PO -add po BP medications -Follow Dilantin levels  SHARON BIBY, MSN, RN, ANVP-BC, ANP-BC, GNP-BC [facility] Pager: [phone] 01/07/2012 9:57 AM  Scribe for Dr. Andrey Spearman who has personally reviewed chart, pertinent data, examined the patient and developed the plan of care.  [facility] - Stroke Team Andrey Spearman, MD 01/07/2012, 10:19 AM   Please refer to [email or web address] for on-call Stroke MD

## 2012-01-07 NOTE — ED Provider Notes (Signed)
78 y.o. Male arrived with report of code stroke and sent to ct directly.  Further history obtained after family arrived revealed patient had grand mal seizure which recurred in ed and was witnessed by nursing staff. Code stroke rescinded due to seizure activity.  Patient had worsening airway protection, and decreased respiratory drive after seizure and medications and was intubated in ed.  I was present for rsi and intubation performed by Dr. Modena Nunnery.     I performed a history and physical examination of Chris Simpson and discussed his management with Dr.Beaver.  I agree with the history, physical, assessment, and plan of care, with the following exceptions: None  I was present for the following procedures: None Time Spent in Critical Care of the patient: 37 Time spent in discussions with the patient and family:0 Claudy Abdallah Shelda Jakes, MD 01/07/12 1423

## 2012-01-07 NOTE — Progress Notes (Signed)
eLink Physician-Brief Progress Note Patient Name: DMON LITAKER DOB: 05-09-1932 MRN: [id number]  Date of Service  01/07/2012   HPI/Events of Note  Continued issues with ongoing hypertension despite scheduled hydralazine along with prns of lopressor and hydralazine.  Had been on cardene gtt   eICU Interventions  Plan: Restart cardene gtt for BP control   Intervention Category Major Interventions: Hypertension - evaluation and management  Arnesia Vincelette 01/07/2012, 3:46 AM

## 2012-01-07 NOTE — Progress Notes (Signed)
Pt transported to radiology for barium swallow study and MRI with contrast.  Pt accomp by RN, monitored, on 2L [place] o2..  VSS. Pt tolerated procedures and transport well.

## 2012-01-07 NOTE — Procedures (Signed)
Objective Swallowing Evaluation: Modified Barium Swallowing Study  Patient Details  Name: Chris Simpson MRN: [id number] Date of Birth: 03-Feb-1932  Today's Date: 01/07/2012 Time: 1300-1325 SLP Time Calculation (min): 25 min  Past Medical History:  Past Medical History  Diagnosis Date  . Hypertension    Past Surgical History: History reviewed. No pertinent past surgical history. HPI:  Chris Simpson is an 78 y.o. male with HTN. Patient was with his nephew eating at a restaurant at 13:50 when patient noted a flashing light in his vision. While he was in the restaurant he collapsed and had notable seizure. Patient was emergently brought tot he ED as a code stroke. Initial CT head was negative for stroke. Initial BP was 210/110 via EMS. While in ED his BP dropped and HR ranged between 30-40. After patient was brought back to Trauma C patient began to show focal left sided seizure which became generalized TC. Patient was emergently intubated and given total of 4 mg Ativan. Dilantin was ordered.  Pt with overt signs of aspriation at bedside MBS ordered to objectively evaluate swallow function     Assessment / Plan / Recommendation Clinical Impression  Dysphagia Diagnosis: Mild pharyngeal phase dysphagia;Mild oral phase dysphagia Clinical impression: Pt presents with a mild pharyngeal dysphagia with sluggish swallow initiation and primary motor deficits. Pt with one instance of trace penetration before the swallow with thin liquids due to large consecutive impulsive intake though pt adequately sensed penetrate and cleared his throat. Laryngeal elevationa dn anterior hyoid excursion were mildy redued with mild residuals present post swallow which the pt indpendently clears with a seond swallow in most trials. Decreased cogntition with impulsivity and poor awareness put pt at greatest aspiration risk. Pt may initiate a regular diet with thin liquids will full supervision. SLP will follow for tolerance.       Treatment Recommendation  Therapy as outlined in treatment plan below    Diet Recommendation Regular;Thin liquid   Liquid Administration via: Cup;Straw Medication Administration: Whole meds with puree Supervision: Full supervision/cueing for compensatory strategies;Patient able to self feed Compensations: Slow rate;Small sips/bites;Check for pocketing Postural Changes and/or Swallow Maneuvers: Seated upright 90 degrees    Other  Recommendations Oral Care Recommendations: Oral care QID;Staff/trained caregiver to provide oral care   Follow Up Recommendations  Skilled Nursing facility;24 hour supervision/assistance    Frequency and Duration min 2x/week  2 weeks   Pertinent Vitals/Pain NA    SLP Swallow Goals Patient will consume recommended diet without observed clinical signs of aspiration with: Supervision/safety;Modified independent assistance Patient will utilize recommended strategies during swallow to increase swallowing safety with: Minimal assistance   General HPI: Chris Simpson is an 78 y.o. male with HTN. Patient was with his nephew eating at a restaurant at 13:50 when patient noted a flashing light in his vision. While he was in the restaurant he collapsed and had notable seizure. Patient was emergently brought tot he ED as a code stroke. Initial CT head was negative for stroke. Initial BP was 210/110 via EMS. While in ED his BP dropped and HR ranged between 30-40. After patient was brought back to Trauma C patient began to show focal left sided seizure which became generalized TC. Patient was emergently intubated and given total of 4 mg Ativan. Dilantin was ordered.  Pt with overt signs of aspriation at bedside MBS ordered to objectively evaluate swallow function Type of Study: Modified Barium Swallowing Study Reason for Referral: Objectively evaluate swallowing function Diet Prior to this  Study: Dysphagia 1 (puree);Nectar-thick liquids Temperature Spikes Noted: No Respiratory  Status: Supplemental O2 delivered via (comment) History of Recent Intubation: Yes Length of Intubations (days): 1 days Date extubated: 01/06/12 Behavior/Cognition: Confused;Impulsive;Lethargic;Distractible;Requires cueing;Decreased sustained attention Oral Cavity - Dentition: Adequate natural dentition;Missing dentition;Poor condition Oral Motor / Sensory Function: Impaired - see Bedside swallow eval Self-Feeding Abilities: Needs assist Patient Positioning: Upright in chair Baseline Vocal Quality: Hoarse Volitional Cough: Congested Volitional Swallow: Able to elicit Anatomy: Within functional limits Pharyngeal Secretions: Not observed secondary MBS    Reason for Referral Objectively evaluate swallowing function   Oral Phase     Pharyngeal Phase Pharyngeal Phase: Impaired   Cervical Esophageal Phase    GO    Cervical Esophageal Phase: [facility]    Sky Borboa, Katherene Ponto 01/07/2012, 1:45 PM

## 2012-01-07 NOTE — Progress Notes (Signed)
  Echocardiogram 2D Echocardiogram has been performed.  Chris Simpson 01/07/2012, 3:15 PM

## 2012-01-07 NOTE — Evaluation (Signed)
Physical Therapy Evaluation Patient Details Name: Chris Simpson MRN: [id number] DOB: 1931/08/01 Today's Date: 01/07/2012 Time: [id number] PT Time Calculation (min): 25 min  PT Assessment / Plan / Recommendation Clinical Impression  pt admitted with seizure-like activity.  While in the hospital code stroke called.  MRI shows multiple infarcts on the Right.   Pt with decr mentation, decrease balance, gait instability/ataxia.  Pt will need some rehab before home.  Per pt , he has no one to count on for assistance/Supervision,  Will likely need rehab at [facility].    PT Assessment  Patient needs continued PT services    Follow Up Recommendations  Skilled nursing facility;Supervision for mobility/OOB    Barriers to Discharge Decreased caregiver support      Equipment Recommendations  Defer to next venue    Recommendations for Other Services     Frequency Min 3X/week    Precautions / Restrictions Precautions Precautions: Fall   Pertinent Vitals/Pain       Mobility  Bed Mobility Bed Mobility: Supine to Sit;Sitting - Scoot to Edge of Bed Supine to Sit: 4: Min assist Sitting - Scoot to [employer] of Bed: 4: Min assist Details for Bed Mobility Assistance: vc's for safety, hand placement, direction; minimal truncal assist Transfers Transfers: Sit to Stand;Stand to Sit Sit to Stand: 4: Min assist;3: Mod assist;From bed (variable assist due to changes in focus) Stand to Sit: 4: Min assist;To bed;With upper extremity assist;To chair/3-in-1 Details for Transfer Assistance: vc's for safety, hand placement; stability assist Ambulation/Gait Ambulation/Gait Assistance: 4: Min assist;3: Mod assist Ambulation Distance (Feet): 150 Feet Assistive device: 1 person hand held assist Ambulation/Gait Assistance Details: staggered steps, mild ataxia L LE, min to moderate list Right depending on ability to focus in a crowded area Gait Pattern: Step-through pattern;Decreased step length - right;Decreased step  length - left;Decreased stride length;Ataxic Stairs: No Wheelchair Mobility Wheelchair Mobility: No Modified Rankin (Stroke Patients Only) Pre-Morbid Rankin Score: No significant disability Modified Rankin: Moderately severe disability    Exercises     PT Diagnosis: Difficulty walking;Generalized weakness;Other (comment) (mild left HP/incoordination)  PT Problem List: Decreased strength;Decreased activity tolerance;Decreased balance;Decreased mobility;Decreased coordination;Decreased knowledge of use of DME;Decreased safety awareness;Decreased knowledge of precautions PT Treatment Interventions: Gait training;Stair training;Functional mobility training;Therapeutic activities;Balance training;Neuromuscular re-education;Patient/family education   PT Goals Acute Rehab PT Goals PT Goal Formulation: With patient Time For Goal Achievement: 01/14/12 Potential to Achieve Goals: Good Pt will go Supine/Side to Sit: with supervision PT Goal: Supine/Side to Sit - Progress: Goal set today Pt will go Sit to Stand: with supervision PT Goal: Sit to Stand - Progress: Goal set today Pt will Transfer Bed to Chair/Chair to Bed: with supervision PT Transfer Goal: Bed to Chair/Chair to Bed - Progress: Goal set today Pt will Ambulate: >150 feet;with supervision;with least restrictive assistive device PT Goal: Ambulate - Progress: Goal set today Pt will Go Up / Down Stairs: 3-5 stairs;with supervision;with rail(s);Other (comment) (if is to go home directly only) PT Goal: Up/Down Stairs - Progress: Goal set today  Visit Information  Last PT Received On: 01/07/12 Assistance Needed: +2 (for lines)    Subjective Data  Subjective: I've got to pee... I know I've got to pee Patient Stated Goal: I've fot to do for myself   Prior Functioning  Home Living Lives With: Alone Available Help at Discharge: Other (Comment) (has no one to count on around here except cousin) Type of Home: Apartment Home Access:  Stairs to enter Entrance Stairs-Number of Steps: 10  Entrance Stairs-Rails: Left;Right Home Layout: One level Bathroom Shower/Tub: Multimedia programmer: Standard Bathroom Accessibility: No Home Adaptive Equipment: Shower chair without back;Grab bars in shower;Grab bars around toilet;Walker - rolling Prior Function Level of Independence: Independent with assistive device(s);Independent Able to Take Stairs?: Yes Driving: No Communication Communication: No difficulties    Cognition  Overall Cognitive Status: Impaired Area of Impairment: Attention;Memory;Following commands;Safety/judgement;Awareness of deficits Arousal/Alertness: Awake/alert Orientation Level: Disoriented to;Situation;Time Behavior During Session: Restless Current Attention Level: Sustained Following Commands: Follows one step commands inconsistently Safety/Judgement: Decreased awareness of need for assistance    Extremity/Trunk Assessment Right Lower Extremity Assessment RLE ROM/Strength/Tone: Within functional levels Left Lower Extremity Assessment LLE ROM/Strength/Tone: WFL for tasks assessed;Deficits LLE ROM/Strength/Tone Deficits: grossly weaker at 4/5 and decr coordination   Balance Balance Balance Assessed: Yes Static Sitting Balance Static Sitting - Balance Support: Left upper extremity supported;Right upper extremity supported;Feet supported Static Sitting - Level of Assistance: 5: Stand by assistance Static Standing Balance Static Standing - Balance Support: Bilateral upper extremity supported;During functional activity Static Standing - Level of Assistance: 4: Min assist;3: Mod assist  End of Session PT - End of Session Equipment Utilized During Treatment: Gait belt Activity Tolerance: Patient tolerated treatment well Patient left: in chair;with call bell/phone within reach;Other (comment) Freight forwarder) Nurse Communication: Mobility status  GP     Tiernan Suto, Tessie Fass 01/07/2012, 5:05  PM  01/07/2012  Donnella Sham, PT [phone] (pager)

## 2012-01-07 NOTE — Progress Notes (Signed)
Name: Chris Simpson MRN: [id number] DOB: 03-19-1932    LOS: 3 Date of admit: 01/04/2012  2:46 PM   Referring Provider:  ER  Reason for Referral:  Acute respiratory failure   PULMONARY / CRITICAL CARE MEDICINE  Brief patient description:  78 y/o noncompliant with BP regimen admitted on 8/9 with hypertensive urgency. Intubated after having seizure.   Events Since Admission: 8/9   Admitted for hypertensive urgency, intubated after seizure  Interval history:  No acute issues.  Still on Cardene gtt.  Vital Signs: Temp:  [99 F (37.2 C)-100.4 F (38 C)] 99.3 F (37.4 C) (08/12 0915) Pulse Rate:  [77-129] 93  (08/12 0915) Resp:  [13-52] 16  (08/12 0915) BP: (112-222)/(47-148) 159/74 mmHg (08/12 0915) SpO2:  [95 %-100 %] 98 % (08/12 0915) Weight:  [61 kg (134 lb 7.7 oz)] 61 kg (134 lb 7.7 oz) (08/12 0500)  Physical Examination: General:  No distress Neuro: Awake, alert, cooperative HEENT:  No JVD Cardiovascular:  RRR, no murmurs Lungs: CTAB Abdomen:  Soft, nontender, bowel sounds present Musculoskeletal:  Moves all extremities Skin:  Intact  Principal Problem:  *Hypertensive emergency Active Problems:  Acute respiratory failure  Seizure  Altered mental status  Hypokalemia  CRI (chronic renal insufficiency)  ASSESSMENT AND PLAN  PULMONARY  Lab 01/04/12 2033 01/04/12 1702  PHART 7.337* 7.347*  PCO2ART 41.6 48.3*  PO2ART 136.0* 528.0*  HCO3 22.0 26.5*  O2SAT 98.7 100.0   Ventilator Settings:   CXR:  8/10 >>> No significant infiltrates ETT:  8/9 >>> 8/11  A: Acute respiratory failure in setting of seizure - resolved. P:   Goal SpO2 > 92 Supplemental oxygen  CARDIOVASCULAR  Lab 01/07/12 0521 01/04/12 2013 01/04/12 1445  TROPONINI -- -- <0.30  LATICACIDVEN 2.0 4.0* --  PROBNP -- 547.8* --   ECG:  8/11 >>> Sinus tachycardia, LVH Lines:  NA  A:  Hypertensive Emergency, improved.  HTN.  Medical noncompliance. P:  Goal SBP 140-180 D/c Cardene D/c   Metoprolol Continue Hydralazine PRN Start Labetalol 200 bid  RENAL  Lab 01/07/12 0521 01/06/12 1412 01/06/12 0908 01/05/12 0533 01/04/12 2013 01/04/12 1502  NA 140 140 141 143 137 --  K 4.1 4.2 -- -- -- --  CL 105 106 110 107 102 --  CO2 20 21 18* 24 22 --  BUN 17 12 12 12 11  --  CREATININE 1.49* 1.48* 1.52* 1.40* 1.35 --  CALCIUM 9.3 9.2 8.7 8.3* 8.2* --  MG 1.7 -- 1.6 1.6 1.7 1.8  PHOS -- -- 2.9 1.7* 2.3 2.8   Intake/Output      08/11 0701 - 08/12 0700 08/12 0701 - 08/13 0700   I.V. (mL/kg) 634.7 (10.4) 40 (0.7)   NG/GT 30    IV Piggyback 108    Total Intake(mL/kg) 772.7 (12.7) 40 (0.7)   Urine (mL/kg/hr) 4945 (3.4)    Total Output 4945    Net -4172.3 +40         Foley:  8/9>>>  A:  Acute on chronic renal failure.  Hypomagnesemia.  P:   Trend BMP Replace Mg, recheck in AM  GASTROINTESTINAL  Lab 01/05/12 0533 01/04/12 2013 01/04/12 1502  AST -- 18 16  ALT -- 8 8  ALKPHOS -- 82 84  BILITOT -- 0.5 0.3  PROT -- 7.1 7.7  ALBUMIN 2.8* 3.2* 3.5   A: No active issues. P:   Diet  HEMATOLOGIC  Lab 01/05/12 0533 01/04/12 2013 01/04/12 1502 01/04/12 1458  HGB 13.7 14.1  14.8 16.7  HCT 42.1 43.0 45.6 49.0  PLT 187 220 271 --  INR -- 1.14 1.11 --  APTT -- 30 29 --   A:  No acute issues P:  Trend CBC  INFECTIOUS  Lab 01/05/12 0533 01/04/12 2013 01/04/12 1744 01/04/12 1502  WBC 10.9* 13.6* -- 12.9*  PROCALCITON -- -- <0.10 --   Cultures: 8/9 Blood >>> ntd 8/9 Urine >>> ntd  Antibiotics: 8/9 Unasyn >>> 8/12  A:  Suspected aspiration pneumonia / pneumonitis but reassuring PCT and CXR  P:   D/c Unasyn  ENDOCRINE  Lab 01/04/12 1509  GLUCAP 155*   A:  Hyperglycemia P:   Start CBG / SSI  NEUROLOGIC  Head CT:  8/9 >>> No acute intracranial abnormality. Moderate generalized atrophy. Old lacunar strokes in the thalami bilaterally and the right basal ganglia.  Brain MRI:  8/10 >>>  Several acute predominantly small non hemorrhagic infarcts span  throughout the right hemisphere involving posterior right frontal lobe, right parietal lobe, right occipital lobe and posterior right temporal lobe with small acute infarct at the junction of the right thalamus and posterior limb of the right internal capsule. Largest acute infarct medial aspect of the right occipital lobe. No intracranial hemorrhage. Remote right corona radiata infarcts. Small remote left thalamic and basal ganglia infarcts. Remote small right cerebellar infarct. Moderate small vessel disease type changes. Global atrophy without hydrocephalus. 8 x 7.9 x 5.6 mm mass projects from the right pituitary gland to the undersurface of the optic nerves/optic chiasm. Etiology indeterminate. This may represent a primary pituitary microadenoma with suprasellar extension although a Rathke cleft cyst or a craniopharyngioma are not entirely excluded. Left vertebral artery is occluded. There is a collateral flow within the distal left vertebral artery and left PICA. Left PICA is narrowed and irregular. Mild narrowing right vertebral artery without high-grade stenosis. Small right PICA. No high-grade stenosis of the basilar artery. Nonvisualization right AICA and only small portion left AICA visualized. Moderate tandem stenosis of the superior cerebellar artery bilaterally. Moderate to marked narrowing of portions of the posterior cerebral arteries most notable mid to distal aspect although seen proximally as well. Broad-based bulge of the proximal cavernous segment of the right internal carotid artery may be related to the atherosclerotic type changes although blister aneurysm not entirely excluded. Mild narrowing of portions of the supraclinoid segment of the internal carotid artery bilaterally. Mild narrowing portions of the A1 segment of the anterior cerebral artery bilaterally. Middle cerebral artery and anterior cerebral artery branch vessel irregularity bilaterally. This has been made a PRA call report  utilizing dashboard call feature.  A:  Hypertensive encephalopathy. PRES? Acute R PCA area infarcts. Old infarcts. Pituitary mass.  Seizures on admission, no recurrence. P:   Follow Neurology recommendations  BEST PRACTICE / DISPOSITION Level of Care:  ICU Primary Service:  PCCM Consultants:  Neurology Code Status:  Full Diet:  Diet DVT Px:  Heparin GI Px:  Not indicated, Protonix d/c'd Skin Integrity:  Intact Social / Family: Not available at rounds  The patient is critically ill with multiple organ systems failure and requires high complexity decision making for assessment and support, frequent evaluation and titration of therapies, application of advanced monitoring technologies and extensive interpretation of multiple databases. Critical Care Time devoted to patient care services described in this note is 35 minutes.  Penne Lash, M.D., F.C.C.P. Pulmonary and [place] Cell: [phone] Pager: [phone]   01/07/2012 10:11 AM

## 2012-01-08 DIAGNOSIS — I10 Essential (primary) hypertension: Secondary | ICD-10-CM | POA: Diagnosis not present

## 2012-01-08 DIAGNOSIS — R4182 Altered mental status, unspecified: Secondary | ICD-10-CM | POA: Diagnosis not present

## 2012-01-08 DIAGNOSIS — R569 Unspecified convulsions: Secondary | ICD-10-CM | POA: Diagnosis not present

## 2012-01-08 DIAGNOSIS — N189 Chronic kidney disease, unspecified: Secondary | ICD-10-CM | POA: Diagnosis not present

## 2012-01-08 LAB — CBC
HCT: 42.9 % (ref 39.0–52.0)
Hemoglobin: 14.3 g/dL (ref 13.0–17.0)
MCH: 31.6 pg (ref 26.0–34.0)
RBC: 4.53 MIL/uL (ref 4.22–5.81)

## 2012-01-08 LAB — GLUCOSE, CAPILLARY
Glucose-Capillary: 169 mg/dL — ABNORMAL HIGH (ref 70–99)
Glucose-Capillary: 113 mg/dL — ABNORMAL HIGH (ref 70–99)

## 2012-01-08 LAB — HEMOGLOBIN A1C: Mean Plasma Glucose: 123 mg/dL — ABNORMAL HIGH (ref <117)

## 2012-01-08 LAB — BASIC METABOLIC PANEL
BUN: 20 mg/dL (ref 6–23)
CO2: 21 mEq/L (ref 19–32)
Chloride: 106 mEq/L (ref 96–112)
GFR calc Af Amer: 46 mL/min — ABNORMAL LOW (ref >90)
Potassium: 3.8 mEq/L (ref 3.5–5.1)

## 2012-01-08 LAB — MAGNESIUM: Magnesium: 1.8 mg/dL (ref 1.5–2.5)

## 2012-01-08 MED ORDER — ENSURE COMPLETE PO LIQD
237.0000 mL | Freq: Two times a day (BID) | ORAL | Status: DC
  Administered 2012-01-08 – 2012-01-09 (×3): 237 mL via ORAL

## 2012-01-08 MED ORDER — MAGNESIUM SULFATE 40 MG/ML IJ SOLN
2.0000 g | Freq: Once | INTRAMUSCULAR | Status: AC
  Administered 2012-01-08: 2 g via INTRAVENOUS
  Filled 2012-01-08: qty 50

## 2012-01-08 MED ORDER — PHENYTOIN SODIUM EXTENDED 100 MG PO CAPS
100.0000 mg | ORAL_CAPSULE | Freq: Two times a day (BID) | ORAL | Status: DC
  Administered 2012-01-08: 100 mg via ORAL
  Filled 2012-01-08 (×2): qty 1

## 2012-01-08 MED ORDER — LEVETIRACETAM 500 MG PO TABS
500.0000 mg | ORAL_TABLET | Freq: Two times a day (BID) | ORAL | Status: DC
  Administered 2012-01-08 – 2012-01-09 (×2): 500 mg via ORAL
  Filled 2012-01-08 (×3): qty 1

## 2012-01-08 NOTE — Progress Notes (Signed)
RN notified of abnormal BP

## 2012-01-08 NOTE — Progress Notes (Signed)
Speech Language Pathology Dysphagia Treatment Patient Details Name: Chris Simpson MRN: [id number] DOB: 07-Apr-1932 Today's Date: 01/08/2012 Time: 1110-1140 SLP Time Calculation (min): 30 min  Assessment / Plan / Recommendation Clinical Impression  Pt observed with lunch. SLP provided education and moderate verbal cues to faciliate aspiraiton precautions, lingual sweep between bites. Pt followed with 80% accuracy. no overt s/s of aspiration observedWIll need continued therapy for safe PO intake and awareness. SLP will continue to follow    Diet Recommendation  Continue with Current Diet: Thin liquid;Regular    SLP Plan Continue with current plan of care   Pertinent Vitals/Pain NA   Swallowing Goals  SLP Swallowing Goals Patient will consume recommended diet without observed clinical signs of aspiration with: Supervision/safety;Modified independent assistance Swallow Study Goal #1 - Progress: Progressing toward goal Patient will utilize recommended strategies during swallow to increase swallowing safety with: Minimal assistance Swallow Study Goal #2 - Progress: Progressing toward goal  General    Oral Cavity - Oral Hygiene     Dysphagia Treatment Treatment focused on: Skilled observation of diet tolerance;Patient/family/caregiver education;Utilization of compensatory strategies Family/Caregiver Educated: sister, niece Treatment Methods/Modalities: Skilled observation Patient observed directly with PO's: Yes Type of PO's observed: Regular;Thin liquids Feeding: Able to feed self Liquids provided via: Cup;Straw Oral Phase Signs & Symptoms: Right pocketing;Left pocketing;Prolonged bolus formation;Prolonged oral phase Type of cueing: Verbal Amount of cueing: Moderate   GO     Chris Simpson, Katherene Ponto 01/08/2012, 2:03 PM

## 2012-01-08 NOTE — Progress Notes (Addendum)
Clinical Social Work Department CLINICAL SOCIAL WORK PLACEMENT NOTE 01/08/2012  Patient:  Chris Simpson, Chris Simpson  Account Number:  [id number] Admit date:  01/04/2012  Clinical Social Worker:  Ulyess Blossom  Date/time:  01/08/2012 04:53 PM  Clinical Social Work is seeking post-discharge placement for this patient at the following level of care:   SKILLED NURSING   (*CSW will update this form in Epic as items are completed)   01/08/2012  Patient/family provided with [facility] Department of Clinical Social Work's list of facilities offering this level of care within the geographic area requested by the patient (or if unable, by the patient's family).  01/08/2012  Patient/family informed of their freedom to choose among providers that offer the needed level of care, that participate in Medicare, Medicaid or managed care program needed by the patient, have an available bed and are willing to accept the patient.  01/08/2012  Patient/family informed of [facility]' ownership interest in [facility], as well as of the fact that they are under no obligation to receive care at this facility.  PASARR submitted to EDS on 01/08/2012 PASARR number received from EDS on 01/08/2012  FL2 transmitted to all facilities in geographic area requested by pt/family on  01/08/2012 FL2 transmitted to all facilities within larger geographic area on   Patient informed that his/her managed care company has contracts with or will negotiate with  certain facilities, including the following:     Patient/family informed of bed offers received:  01/09/2012 Patient chooses bed at [place] [facility] Physician recommends and patient chooses bed at  SNF  Patient to be transferred to [place] at [place] on 01/09/2012 Patient to be transferred to facility by [facility]  The following physician request were entered in Epic:   Additional Comments:    Drake Leach, MSW, [place] Work [phone]

## 2012-01-08 NOTE — Progress Notes (Signed)
Patient removed condom cath.  Placed urinal in patient's room.  Continuing to monitor.

## 2012-01-08 NOTE — Progress Notes (Signed)
Pt has a hacking cough that he states started around 1100, since then he has been coughing up a little amount of sputum that the clear in color and thin in consistency, paged [facility] MD to get an order for a CXR.  Lung sounds are diminished bilaterally and patient had some minimal wheezing upon taking a deep breath. Patient is stable, VSS, alert and oriented (Neuro Intact). Awaiting orders

## 2012-01-08 NOTE — Progress Notes (Signed)
Nutrition Follow-up  Intervention:   Ensure Complete po BID, each supplement provides 350 kcal and 13 grams of protein.  Assessment:   Pt reports that his appetite is good. He has been able to consume >75% of his meals. Pt has been feeding himself. Pt likes ensure and has drank it before. Pt's weight has been variable this admission, suspect due to bed scales. Weight this am was 129 lbs, when I was in room I re-weighed pt at 136 lbs. Pt appears to be close to 130 lbs vs the 160 lbs on admission. Per pt his usual weight is 150 lbs. Suspect some level of malnutrition but unable to determine at this time.  SLP recommended diet change to Regular diet with thin liquids. Per RN SLP to address with MD.   Diet Order:  Dysphagia 1/Nectar thick liquids Meal completion >75%  Meds: Scheduled Meds:   . antiseptic oral rinse  15 mL Mouth Rinse BID  . aspirin  325 mg Oral Daily  . gadobenate dimeglumine  15 mL Intravenous Once  . heparin subcutaneous  5,000 Units Subcutaneous Q8H  . insulin aspart  0-15 Units Subcutaneous TID WC  . insulin aspart  0-5 Units Subcutaneous QHS  . labetalol  200 mg Oral BID  . phenytoin (DILANTIN) IV  100 mg Intravenous Q8H  . DISCONTD: ampicillin-sulbactam (UNASYN) IV  1.5 g Intravenous Q6H  . DISCONTD: aspirin  300 mg Rectal Daily  . DISCONTD: heparin subcutaneous  5,000 Units Subcutaneous Q8H  . DISCONTD: pantoprazole (PROTONIX) IV  40 mg Intravenous Q24H   Continuous Infusions:   . sodium chloride 20 mL/hr at 01/08/12 0800  . DISCONTD: niCARDipine 2 mg/hr (01/07/12 1100)   PRN Meds:.acetaminophen (TYLENOL) oral liquid 160 mg/5 mL, acetaminophen, albuterol-ipratropium, hydrALAZINE, labetalol, DISCONTD: metoprolol  Labs:  CMP     Component Value Date/Time   NA 140 01/07/2012 0521   K 4.1 01/07/2012 0521   CL 105 01/07/2012 0521   CO2 20 01/07/2012 0521   GLUCOSE 150* 01/07/2012 0521   BUN 17 01/07/2012 0521   CREATININE 1.49* 01/07/2012 0521   CALCIUM 9.3  01/07/2012 0521   PROT 7.1 01/04/2012 2013   ALBUMIN 2.8* 01/05/2012 0533   AST 18 01/04/2012 2013   ALT 8 01/04/2012 2013   ALKPHOS 82 01/04/2012 2013   BILITOT 0.5 01/04/2012 2013   GFRNONAA 43* 01/07/2012 0521   GFRAA 50* 01/07/2012 0521   CBG (last 3)   Basename 01/08/12 0811 01/07/12 2144 01/07/12 1704  GLUCAP 79 124* 155*    Intake/Output Summary (Last 24 hours) at 01/08/12 0843 Last data filed at 01/08/12 0800  Gross per 24 hour  Intake   1160 ml  Output   1030 ml  Net    130 ml  Last BM: 01/07/12  Weight Status:   129 lbs 8/13 134 lbs 8/12  Re-estimated needs:   1800-2000 kcal; 90-100 grams protein  Nutrition Dx: Inadequate oral intake now r/t swallowing difficulty AEB dysphagia diet; ongoing.  Goal: Pt to meet >/= 90% of their estimated nutrition needs; not met  Monitor:  PO intake, supplement acceptance, swallow ability   [place], LDN, CNSC [phone] Pager [phone] After Hours Pager

## 2012-01-08 NOTE — Progress Notes (Signed)
Stroke Team Progress Note  HISTORY Chris Simpson is an 78 y.o. male with HTN. Patient was with his nephew eating at a restaurant at 13:50 on 01/04/2012 when patient noted a flashing light in his vision. While he was in the restaurant he collapsed and had notable seizure. Patient was emergently brought to the ED as a code stroke. Initial CT head was negative for stroke. Initial BP was 210/110 via EMS. While in ED his BP dropped and HR ranged between 30-40. After patient was brought back to Trauma C patient began to show focal left sided seizure which became generalized. Patient was emergently intubated and given total of 4 mg Ativan. Dilantin was ordered.   SUBJECTIVE Moved to new room on floor. Pt with no complaints. "thanks for coming by".   OBJECTIVE Filed Vitals:   01/08/12 1010 01/08/12 1128 01/08/12 1341 01/08/12 1519  BP: 155/79 151/103 135/64   Pulse: 81 90 74   Temp:  98.1 F (36.7 C) 97.9 F (36.6 C)   TempSrc:  Oral Oral   Resp:    18  Height:      Weight:      SpO2:  100% 100%    CBG (last 3)   Basename 01/08/12 1141 01/08/12 0811 01/07/12 2144  GLUCAP 169* 79 124*   Intake/Output from previous day: 08/12 0701 - 08/13 0700 In: 1160 [P.O.:660; I.V.:450; IV Piggyback:50] Out: 1030 [Urine:1030]  IV Fluid Intake:      . sodium chloride 20 mL/hr at 01/08/12 1000   Medications     . aspirin  325 mg Oral Daily  . feeding supplement  237 mL Oral BID BM  . heparin subcutaneous  5,000 Units Subcutaneous Q8H  . insulin aspart  0-15 Units Subcutaneous TID WC  . insulin aspart  0-5 Units Subcutaneous QHS  . labetalol  200 mg Oral BID  . magnesium sulfate 1 - 4 g bolus IVPB  2 g Intravenous Once  . phenytoin  100 mg Oral BID  . DISCONTD: antiseptic oral rinse  15 mL Mouth Rinse BID  . DISCONTD: phenytoin (DILANTIN) IV  100 mg Intravenous Q8H  PRN acetaminophen (TYLENOL) oral liquid 160 mg/5 mL, acetaminophen, albuterol-ipratropium, hydrALAZINE, labetalol  Diet:  General 1  nectar thick liquids Activity:  up in chair as tolerated DVT Prophylaxis:  Subcutaneous heparin  Significant Diagnostic Studies: CBC    Component Value Date/Time   WBC 10.5 01/08/2012 1010   RBC 4.53 01/08/2012 1010   HGB 14.3 01/08/2012 1010   HCT 42.9 01/08/2012 1010   PLT 185 01/08/2012 1010   MCV 94.7 01/08/2012 1010   MCH 31.6 01/08/2012 1010   MCHC 33.3 01/08/2012 1010   RDW 15.7* 01/08/2012 1010   LYMPHSABS 1.9 01/04/2012 2013   MONOABS 1.0 01/04/2012 2013   EOSABS 0.0 01/04/2012 2013   BASOSABS 0.0 01/04/2012 2013   CMP    Component Value Date/Time   NA 140 01/08/2012 1010   K 3.8 01/08/2012 1010   CL 106 01/08/2012 1010   CO2 21 01/08/2012 1010   GLUCOSE 168* 01/08/2012 1010   BUN 20 01/08/2012 1010   CREATININE 1.61* 01/08/2012 1010   CALCIUM 9.1 01/08/2012 1010   PROT 7.1 01/04/2012 2013   ALBUMIN 2.8* 01/05/2012 0533   AST 18 01/04/2012 2013   ALT 8 01/04/2012 2013   ALKPHOS 82 01/04/2012 2013   BILITOT 0.5 01/04/2012 2013   GFRNONAA 39* 01/08/2012 1010   GFRAA 46* 01/08/2012 1010   COAGS Lab Results  Component Value Date   INR 1.14 01/04/2012   INR 1.11 01/04/2012   Lipid Panel     Component Value Date/Time   CHOL 156 01/07/2012 0521   TRIG 104 01/07/2012 0521   HDL 45 01/07/2012 0521   CHOLHDL 3.5 01/07/2012 0521   VLDL 21 01/07/2012 0521   LDLCALC 90 01/07/2012 0521   HgbA1C  Lab Results  Component Value Date   HGBA1C 5.9* 01/07/2012   Drugs of Abuse     Component Value Date/Time   LABOPIA NEGATIVE 01/06/2012 1332   COCAINSCRNUR NEGATIVE 01/06/2012 1332   LABBENZ NEGATIVE 01/06/2012 1332   AMPHETMU NEGATIVE 01/06/2012 1332      Alcohol Level    Component Value Date/Time   ETH <11 01/04/2012 1548   Dilantin   01/05/12 0533 14.3 01/06/12 0521 19.5  CT of the brain  01/04/2012 1. No acute intracranial abnormality. 2. Moderate generalized atrophy.  3. Old lacunar strokes in the thalami bilaterally and the right basal ganglia.  MRI of the brain  01/05/2012 Several acute predominantly  small non hemorrhagic infarcts span throughout the right hemisphere involving posterior right frontal lobe, right parietal lobe, right occipital lobe and posterior right temporal lobe with small acute infarct at the junction of the right thalamus and posterior limb of the right internal capsule. Largest acute infarct medial aspect of the right occipital lobe.  MRA of the brain  01/05/2012 Left vertebral artery is occluded. There is a collateral flow within the distal left vertebral artery and left PICA. Left PICA is narrowed and irregular. Mild narrowing right vertebral artery without high-grade stenosis. Small right PICA. No high-grade stenosis of the basilar artery. Nonvisualization right AICA and only small portion left AICA visualized. Moderate tandem stenosis of the superior cerebellar artery bilaterally. Moderate to marked narrowing of portions of the posterior cerebral arteries most notable mid to distal aspect although seen proximally as well. Broad-based bulge of the proximal cavernous segment of the right internal carotid artery may be related to the atherosclerotic type changes although blister aneurysm not entirely excluded. Mild narrowing of portions of the supraclinoid segment of the  internal carotid artery bilaterally. Mild narrowing portions of the A1 segment of the anterior cerebral artery bilaterally. Middle cerebral artery and anterior cerebral artery branch vessel irregularity bilaterally.  MRA Neck 01/07/2012  No significant anterior circulation finding.  Occlusion of the left vertebral artery 2 cm beyond its origin. Reconstitution at the foramen magnum.    2D Echocardiogram EF 55-60% with no source of embolus. Possible akinesis of the basalinferior myocardium.  Carotid Doppler see MRA neck  CXR  01/05/2012 1. Support apparatus, as above. 2. Minimal left lower lobe subsegmental atelectasis and probable small left pleural effusion.  EKG  Sinus tachycardia  GENERAL EXAM: Patient is in  no distress  CARDIOVASCULAR: TACHYCARDIA. Regular rhythm, no murmurs, no carotid bruits  NEUROLOGIC: MENTAL STATUS: awake, alert, language fluent, comprehension intact, naming intact CRANIAL NERVE: pupils equal and reactive to light, visual fields full to confrontation, able to count fingers. No extinction. Extraocular muscles intact, no nystagmus, facial sensation and strength symmetric, uvula midline, shoulder shrug symmetric, tongue midline. MOTOR: normal bulk and tone, full strength in the BUE, BLE SENSORY: normal and symmetric to light touch, pinprick, temperature, vibration and proprioception COORDINATION: finger-nose-finger, fine finger movements, heel-shin normal REFLEXES: deep tendon reflexes present and symmetric GAIT/STATION: narrow based gait; able to walk on toes, heels and tandem; romberg is negative  Therapy Recommendations:  PT - SNF; OT -  ASSESSMENT/PLAN Mr. Chris Simpson is a 78 y.o. male who presented with malignant HTN, seizure and blindness. 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  MRI showing scattered infarcts throughout the right hemisphere (could be in spectrum of PRES or cardioembolic). Full stroke workup is indicated. On aspirin suppository 300 mg daily for secondary stroke prevention.   -Seizure in setting of hypertensive emergency - new dilantin on admission - last level 19.5. Corrected to 26.5 per pharmacy. On 100mg  IV tid. Change to PO and decreased to 100 mg bid this am. Dilantin, given albumin, is not the best choice. Will change to Keppra.  -Malignant hypertension. Off cardene. 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 Follow up with Dr. Leonie Man, [facility], in 2 months.   Burnetta Sabin, MSN, RN, ANVP-BC, ANP-BC, [facility] Pager: [phone] 01/08/2012 4:27 PM  Scribe for Dr. Andrey Spearman who has personally reviewed chart, pertinent data, examined the patient and developed the plan of care.  [facility] - Stroke Team Andrey Spearman, MD 01/08/2012, 10:52 PM   Please refer to [email or web address] for on-call Stroke MD

## 2012-01-08 NOTE — Clinical Social Work Psychosocial (Signed)
     Clinical Social Work Department BRIEF PSYCHOSOCIAL ASSESSMENT 01/08/2012  Patient:  Chris Simpson, Chris Simpson     Account Number:  [id number]     Admit date:  01/04/2012  Clinical Social Worker:  Ulyess Blossom  Date/Time:  01/08/2012 04:00 PM  Referred by:  Physician  Date Referred:  01/08/2012 Referred for  SNF Placement   Other Referral:   Interview type:  Patient Other interview type:    PSYCHOSOCIAL DATA Living Status:  ALONE Admitted from facility:   Level of care:   Primary support name:  Chris Simpson/[phone] Primary support relationship to patient:  SIBLING Degree of support available:   unknown at this time    CURRENT CONCERNS Current Concerns  Post-Acute Placement   Other Concerns:    SOCIAL WORK ASSESSMENT / PLAN CSW received referral for SNF placement. CSW discussed with RNCM who stated that PT changed recommendation to inpatient rehab today, but will still be beneficial to assess for SNF as a secondary option for discharge plan. CSW and RNCM met with pt at bedside to discuss. CSW and RNCM discussed rehab option including inpatient rehab and rehab at [facility]. Pt reports that he lives alone and that pt sister lives in [place]. RNCM discussed that inpatient rehab will most likely assess patient for appropriateness for inpatient rehab. CSW discussed option for SNF and pt agreeable to SNF search in [place]. CSW to complete FL2, submit pasarr, and initiate SNF search to [facility]. CSW to follow up with pt in regard to bed offers and follow up in regard to evaluation from inpatient rehab. CSW to continue to follow and facilitate pt discharge needs if appropriate.   Assessment/plan status:  Psychosocial Support/Ongoing Assessment of Needs Other assessment/ plan:   discharge planning   Information/referral to community resources:   [facility] list    PATIENTS/FAMILYS RESPONSE TO PLAN OF CARE: Pt alert and oriented x 4. Pt  recognizes need for rehab either in inpatient rehab or SNF setting and is agreeable to either venue for discharge plan. Pt appreciative of RNCM and CSW support and assistance.

## 2012-01-08 NOTE — Progress Notes (Signed)
Patient has frequent urgencies to urinate although foley is in place.  Placed patient on bedpan several times and received little to no urine in bedpan.  Removed urinary catheter, Minimal-moderate bloody drainage present via urethra.  Placed condom cath which seemed to relieve patient's urgency.  Patient has not urinated post catheter removal at this time.  Will continue to monitor.

## 2012-01-08 NOTE — Progress Notes (Signed)
Name: Chris Simpson MRN: [id number] DOB: 1931/07/06    LOS: 4 Date of admit: 01/04/2012  2:46 PM   Referring Provider:  ER  Reason for Referral:  Acute respiratory failure   PULMONARY / CRITICAL CARE MEDICINE  Brief patient description:  78 y/o noncompliant with BP regimen admitted on 8/9 with hypertensive urgency. Intubated after having seizure.   Events Since Admission: 8/9   Admitted for hypertensive urgency, intubated after seizure  Interval history:  Good BP control with PO Rx, off Cardene x 24 hours  Vital Signs: Temp:  [98.8 F (37.1 C)-99.7 F (37.6 C)] 98.9 F (37.2 C) (08/13 0700) Pulse Rate:  [69-111] 81  (08/13 1010) Resp:  [14-24] 18  (08/13 1000) BP: (106-171)/(55-100) 155/79 mmHg (08/13 1010) SpO2:  [94 %-100 %] 100 % (08/13 1000) FiO2 (%):  [2 %] 2 % (08/13 0800) Weight:  [58.9 kg (129 lb 13.6 oz)] 58.9 kg (129 lb 13.6 oz) (08/13 0500)  Physical Examination: General:  No distress Neuro: Awake, alert, somewhat confused HEENT:  No JVD Cardiovascular:  RRR, no murmurs Lungs: CTAB Abdomen:  Soft, nontender, bowel sounds present Musculoskeletal:  Moves all extremities, mild L weakness Skin:  Intact  Principal Problem:  *Seizure Active Problems:  Hypertensive emergency  Acute respiratory failure  Altered mental status  Hypokalemia  CRI (chronic renal insufficiency)  Stroke  ASSESSMENT AND PLAN  PULMONARY  Lab 01/04/12 2033 01/04/12 1702  PHART 7.337* 7.347*  PCO2ART 41.6 48.3*  PO2ART 136.0* 528.0*  HCO3 22.0 26.5*  O2SAT 98.7 100.0   Ventilator Settings: Vent Mode:  [-]  FiO2 (%):  [2 %] 2 % CXR:  8/10 >>> No significant infiltrates ETT:  8/9 >>> 8/11  A: Acute respiratory failure in setting of seizure - resolved. P:   Goal SpO2 > 92 Supplemental oxygen  CARDIOVASCULAR  Lab 01/07/12 0521 01/04/12 2013 01/04/12 1445  TROPONINI -- -- <0.30  LATICACIDVEN 2.0 4.0* --  PROBNP -- 547.8* --   ECG:  8/11 >>> Sinus tachycardia, LVH Lines:   NA  A:  Hypertensive Emergency, improved.  HTN.  Medical noncompliance. P:  Goal SBP 140-180 Continue Hydralazine / Labetalol PRN Labetalol 200 bid  RENAL  Lab 01/08/12 1010 01/07/12 0521 01/06/12 1412 01/06/12 0908 01/05/12 0533 01/04/12 2013 01/04/12 1502  NA 140 140 140 141 143 -- --  K 3.8 4.1 -- -- -- -- --  CL 106 105 106 110 107 -- --  CO2 21 20 21  18* 24 -- --  BUN 20 17 12 12 12  -- --  CREATININE 1.61* 1.49* 1.48* 1.52* 1.40* -- --  CALCIUM 9.1 9.3 9.2 8.7 8.3* -- --  MG 1.8 1.7 -- 1.6 1.6 1.7 --  PHOS -- -- -- 2.9 1.7* 2.3 2.8   Intake/Output      08/12 0701 - 08/13 0700 08/13 0701 - 08/14 0700   P.O. 660 360   I.V. (mL/kg) 450 (7.6) 60 (1)   NG/GT     IV Piggyback 50    Total Intake(mL/kg) 1160 (19.7) 420 (7.1)   Urine (mL/kg/hr) 1030 (0.7)    Total Output 1030    Net +130 +420        Urine Occurrence 3 x 1 x   Stool Occurrence 2 x     Foley:  8/9>>>  A:  Acute on chronic renal failure.  Hypomagnesemia.  P:   Trend BMP Magnesium sulfate  GASTROINTESTINAL  Lab 01/05/12 0533 01/04/12 2013 01/04/12 1502  AST -- 18  16  ALT -- 8 8  ALKPHOS -- 82 84  BILITOT -- 0.5 0.3  PROT -- 7.1 7.7  ALBUMIN 2.8* 3.2* 3.5   A: No active issues. P:   Diet  HEMATOLOGIC  Lab 01/08/12 1010 01/05/12 0533 01/04/12 2013 01/04/12 1502 01/04/12 1458  HGB 14.3 13.7 14.1 14.8 16.7  HCT 42.9 42.1 43.0 45.6 49.0  PLT 185 187 220 271 --  INR -- -- 1.14 1.11 --  APTT -- -- 30 29 --   A:  No acute issues P:  No interventions required  INFECTIOUS  Lab 01/08/12 1010 01/05/12 0533 01/04/12 2013 01/04/12 1744 01/04/12 1502  WBC 10.5 10.9* 13.6* -- 12.9*  PROCALCITON -- -- -- <0.10 --   Cultures: 8/9 Blood >>> ntd 8/9 Urine >>> ntd  Antibiotics: 8/9 Unasyn >>> 8/12  A:  Suspected aspiration pneumonia / pneumonitis but reassuring PCT and CXR  P:   CBC today  ENDOCRINE  Lab 01/08/12 1141 01/08/12 0811 01/07/12 2144 01/07/12 1704 01/04/12 1509  GLUCAP 169* 79  124* 155* 155*   A:  Hyperglycemia P:   CBG / SSI  NEUROLOGIC  Head CT:  8/9 >>> No acute intracranial abnormality. Moderate generalized atrophy. Old lacunar strokes in the thalami bilaterally and the right basal ganglia.  Brain MRI:  8/10 >>>  Several acute predominantly small non hemorrhagic infarcts span throughout the right hemisphere involving posterior right frontal lobe, right parietal lobe, right occipital lobe and posterior right temporal lobe with small acute infarct at the junction of the right thalamus and posterior limb of the right internal capsule. Largest acute infarct medial aspect of the right occipital lobe. No intracranial hemorrhage. Remote right corona radiata infarcts. Small remote left thalamic and basal ganglia infarcts. Remote small right cerebellar infarct. Moderate small vessel disease type changes. Global atrophy without hydrocephalus. 8 x 7.9 x 5.6 mm mass projects from the right pituitary gland to the undersurface of the optic nerves/optic chiasm. Etiology indeterminate. This may represent a primary pituitary microadenoma with suprasellar extension although a Rathke cleft cyst or a craniopharyngioma are not entirely excluded. Left vertebral artery is occluded. There is a collateral flow within the distal left vertebral artery and left PICA. Left PICA is narrowed and irregular. Mild narrowing right vertebral artery without high-grade stenosis. Small right PICA. No high-grade stenosis of the basilar artery. Nonvisualization right AICA and only small portion left AICA visualized. Moderate tandem stenosis of the superior cerebellar artery bilaterally. Moderate to marked narrowing of portions of the posterior cerebral arteries most notable mid to distal aspect although seen proximally as well. Broad-based bulge of the proximal cavernous segment of the right internal carotid artery may be related to the atherosclerotic type changes although blister aneurysm not entirely excluded.  Mild narrowing of portions of the supraclinoid segment of the internal carotid artery bilaterally. Mild narrowing portions of the A1 segment of the anterior cerebral artery bilaterally. Middle cerebral artery and anterior cerebral artery branch vessel irregularity bilaterally. This has been made a PRA call report utilizing dashboard call feature.  A:  Hypertensive encephalopathy. PRES? Acute R PCA area infarcts. Old infarcts. Pituitary mass.  Seizures on admission, no recurrence. P:   Follow Neurology recommendations  BEST PRACTICE / DISPOSITION Level of Care:  Downgrade to Med-Surg Primary Service:  Transfer to [facility] starting 8/14 Consultants:  Neurology Code Status:  Full Diet:  Diet DVT Px:  Heparin GI Px:  Not indicated Skin Integrity:  Intact Social / Family: Updated 8/12  K.  Deshondra Worst, M.D., F.C.C.P. Pulmonary and [place] Cell: [phone] Pager: [phone]   01/08/2012 12:06 PM

## 2012-01-08 NOTE — Progress Notes (Signed)
Physical Therapy Treatment Patient Details Name: Chris Simpson MRN: [id number] DOB: 15-Nov-1931 Today's Date: 01/08/2012 Time: [id number] PT Time Calculation (min): 8 min  PT Assessment / Plan / Recommendation Comments on Treatment Session  Improved mentation and notably less gait instability, but still unsafe without assistance    Follow Up Recommendations  Inpatient Rehab    Barriers to Discharge        Equipment Recommendations  Defer to next venue    Recommendations for Other Services Rehab consult  Frequency Min 3X/week   Plan Discharge plan needs to be updated    Precautions / Restrictions Precautions Precautions: Fall Precaution Comments: seizures Restrictions Weight Bearing Restrictions: No   Pertinent Vitals/Pain     Mobility  Bed Mobility Bed Mobility: Not assessed Transfers Transfers: Sit to Stand;Stand to Sit Sit to Stand: 4: Min assist;With upper extremity assist;From chair/3-in-1 Stand to Sit: 4: Min assist;With upper extremity assist;To chair/3-in-1 Details for Transfer Assistance: vc's for safety, hand placement; stability assist Ambulation/Gait Ambulation/Gait Assistance: 3: Mod assist Ambulation Distance (Feet): 160 Feet Assistive device: 1 person hand held assist Ambulation/Gait Assistance Details: L HP gait continues with mild ataxia, wide BOS, mildly ER L LE, tendency to wander and fall off to the R. Gait Pattern: Step-through pattern;Decreased step length - right;Decreased step length - left;Decreased stride length;Ataxic Stairs: No Modified Rankin (Stroke Patients Only) Modified Rankin: Moderately severe disability    Exercises     PT Diagnosis:    PT Problem List:   PT Treatment Interventions:     PT Goals Acute Rehab PT Goals PT Goal Formulation: With patient Time For Goal Achievement: 01/14/12 Potential to Achieve Goals: Good PT Goal: Sit to Stand - Progress: Progressing toward goal PT Transfer Goal: Bed to Chair/Chair to Bed -  Progress: Progressing toward goal PT Goal: Ambulate - Progress: Progressing toward goal  Visit Information  Last PT Received On: 01/08/12 Assistance Needed: +2    Subjective Data      Cognition  Overall Cognitive Status: Impaired Area of Impairment: Attention Arousal/Alertness: Awake/alert Orientation Level: Appears intact for tasks assessed Behavior During Session: [facility] for tasks performed Following Commands: Follows one step commands consistently    Balance  Balance Balance Assessed: Yes Static Sitting Balance Static Sitting - Balance Support: No upper extremity supported;Feet supported Static Sitting - Level of Assistance: 5: Stand by assistance Static Standing Balance Static Standing - Balance Support: Bilateral upper extremity supported;Right upper extremity supported;Left upper extremity supported;During functional activity Static Standing - Level of Assistance: 4: Min assist High Level Balance High Level Balance Activites: Backward walking;Direction changes;Sudden stops High Level Balance Comments: pt is easily thrown off balance to the Right.  L LE unable to coordinate well to maintain balance.  End of Session PT - End of Session Activity Tolerance: Patient tolerated treatment well Patient left: Other (comment) (W/C heading out to another unit) Nurse Communication: Mobility status   GP     Gabryel Talamo, Tessie Fass 01/08/2012, 11:15 AM 01/08/2012  Donnella Sham, PT [phone] (pager)

## 2012-01-08 NOTE — Progress Notes (Signed)
Pt transported to 4N01, accomp by RN and sitter.  Report given to RN.

## 2012-01-08 NOTE — Evaluation (Signed)
Occupational Therapy Evaluation Patient Details Name: Chris Simpson MRN: [id number] DOB: [birth year] Today's Date: 01/08/2012 Time: [id number] OT Time Calculation (min): 8 min  OT Assessment / Plan / Recommendation Clinical Impression  78 yo male s/p seizure like activity and MRI found to have multiple Rt infarcts. OT to follow acutely.    OT Assessment  Patient needs continued OT Services    Follow Up Recommendations  Inpatient Rehab    Barriers to Discharge      Equipment Recommendations  Defer to next venue    Recommendations for Other Services Rehab consult  Frequency  Min 2X/week    Precautions / Restrictions Precautions Precautions: Fall Precaution Comments: seizures Restrictions Weight Bearing Restrictions: No   Pertinent Vitals/Pain NO PAIN    ADL  Grooming: Simulated;Wash/dry face;Minimal assistance (proprioception deficits noted) Where Assessed - Grooming: Unsupported sitting Upper Body Bathing: Simulated;Chest;Right arm;Left arm;Abdomen;Minimal assistance Where Assessed - Upper Body Bathing: Unsupported sitting Toilet Transfer: Simulated;Minimal assistance Toilet Transfer Method: Sit to stand Toilet Transfer Equipment: Raised toilet seat with arms (or 3-in-1 over toilet) Transfers/Ambulation Related to ADLs: pt with HHA with ambulation. Pt with Lt side weakness : pt with widen base of support with walking backwards, pt with narrowed base of support and scissoring bil le with slow pace and pt falling to the rt side with increased speed ADL Comments: pt exiting room with RN staff when OT arrived.Pt completed ambulation around unit to assess balance and cognition. Pt required Mod (A) for balance. pt recognized deficits verbalized reason for admission and recognized that (A) is needed due to balance changes. Pt pleasant and cooperative. Pt's balance affects all adls    OT Diagnosis: Generalized weakness;Paresis (Lt side paresis with ataxia)  OT Problem List: Decreased  strength;Decreased range of motion;Decreased activity tolerance;Impaired balance (sitting and/or standing);Decreased coordination;Decreased safety awareness;Decreased knowledge of use of DME or AE;Decreased knowledge of precautions OT Treatment Interventions: Self-care/ADL training;Neuromuscular education;DME and/or AE instruction;Therapeutic activities;Patient/family education;Balance training   OT Goals Acute Rehab OT Goals OT Goal Formulation: With patient Time For Goal Achievement: 01/22/12 Potential to Achieve Goals: Good ADL Goals Pt Will Perform Grooming: with set-up;Standing at sink ADL Goal: Grooming - Progress: Goal set today Pt Will Perform Upper Body Bathing: with set-up;Sit to stand from chair ADL Goal: Upper Body Bathing - Progress: Goal set today Pt Will Perform Lower Body Bathing: with min assist;Sit to stand from chair ADL Goal: Lower Body Bathing - Progress: Goal set today Pt Will Perform Upper Body Dressing: with set-up;Sit to stand from chair ADL Goal: Upper Body Dressing - Progress: Goal set today Pt Will Perform Lower Body Dressing: with min assist;Sit to stand from chair ADL Goal: Lower Body Dressing - Progress: Goal set today Pt Will Transfer to Toilet: with min assist;3-in-1 ADL Goal: Toilet Transfer - Progress: Goal set today  Visit Information  Last OT Received On: 01/08/12 Assistance Needed: +2 PT/OT Co-Evaluation/Treatment: Yes    Subjective Data  Subjective: "my balance is off" pt response to questioning about awareness of deficits Patient Stated Goal: TO GO fishing   Prior Functioning  Vision/Perception  Home Living Lives With: Alone Available Help at Discharge: Other (Comment) (no one except a cousin) Type of Home: Apartment Home Access: Stairs to enter Entrance Stairs-Number of Steps: 10 Entrance Stairs-Rails: Left;Right Home Layout: One level Bathroom Shower/Tub: Multimedia programmer: Standard Bathroom Accessibility: No Home  Adaptive Equipment: Shower chair without back;Grab bars in shower;Grab bars around toilet;Walker - rolling Prior Function Level of Independence: Independent with  assistive device(s);Independent Able to Take Stairs?: Yes Driving: No Communication Communication: No difficulties Dominant Hand: Right      Cognition  Overall Cognitive Status: Impaired Area of Impairment: Attention Arousal/Alertness: Awake/alert Orientation Level: Appears intact for tasks assessed Behavior During Session: [facility] for tasks performed Following Commands: Follows one step commands consistently    Extremity/Trunk Assessment Right Upper Extremity Assessment RUE ROM/Strength/Tone: Within functional levels RUE Sensation: WFL - Light Touch RUE Coordination: WFL - gross/fine motor Left Upper Extremity Assessment LUE ROM/Strength/Tone: Deficits LUE ROM/Strength/Tone Deficits: 4 out 5 AROM LUE Sensation: Deficits LUE Sensation Deficits: deficits with motor planning and proprioception LUE Coordination: Deficits Trunk Assessment Trunk Assessment: Normal   Mobility Bed Mobility Bed Mobility: Not assessed Transfers Sit to Stand: 4: Min assist;With upper extremity assist;From chair/3-in-1 Stand to Sit: 4: Min assist;With upper extremity assist;To chair/3-in-1 Details for Transfer Assistance: vc's for safety, hand placement; stability assist   Exercise    Balance Balance Balance Assessed: Yes  End of Session OT - End of Session Activity Tolerance: Patient tolerated treatment well Patient left: in chair;with call bell/phone within reach Nurse Communication: Mobility status;Precautions  GO     Veneda Melter 01/08/2012, 11:11 AM Pager: [phone]

## 2012-01-09 DIAGNOSIS — E876 Hypokalemia: Secondary | ICD-10-CM | POA: Diagnosis not present

## 2012-01-09 DIAGNOSIS — R262 Difficulty in walking, not elsewhere classified: Secondary | ICD-10-CM | POA: Diagnosis not present

## 2012-01-09 DIAGNOSIS — I6350 Cerebral infarction due to unspecified occlusion or stenosis of unspecified cerebral artery: Secondary | ICD-10-CM | POA: Diagnosis not present

## 2012-01-09 DIAGNOSIS — I6330 Cerebral infarction due to thrombosis of unspecified cerebral artery: Secondary | ICD-10-CM | POA: Diagnosis not present

## 2012-01-09 DIAGNOSIS — I10 Essential (primary) hypertension: Secondary | ICD-10-CM | POA: Diagnosis not present

## 2012-01-09 DIAGNOSIS — N189 Chronic kidney disease, unspecified: Secondary | ICD-10-CM | POA: Diagnosis not present

## 2012-01-09 DIAGNOSIS — J690 Pneumonitis due to inhalation of food and vomit: Secondary | ICD-10-CM

## 2012-01-09 DIAGNOSIS — M6281 Muscle weakness (generalized): Secondary | ICD-10-CM | POA: Diagnosis not present

## 2012-01-09 DIAGNOSIS — R413 Other amnesia: Secondary | ICD-10-CM | POA: Diagnosis not present

## 2012-01-09 DIAGNOSIS — R569 Unspecified convulsions: Secondary | ICD-10-CM | POA: Diagnosis not present

## 2012-01-09 DIAGNOSIS — J9600 Acute respiratory failure, unspecified whether with hypoxia or hypercapnia: Secondary | ICD-10-CM | POA: Diagnosis not present

## 2012-01-09 DIAGNOSIS — I6789 Other cerebrovascular disease: Secondary | ICD-10-CM | POA: Diagnosis not present

## 2012-01-09 LAB — BASIC METABOLIC PANEL
BUN: 24 mg/dL — ABNORMAL HIGH (ref 6–23)
Chloride: 106 mEq/L (ref 96–112)
Creatinine, Ser: 1.82 mg/dL — ABNORMAL HIGH (ref 0.50–1.35)
GFR calc Af Amer: 39 mL/min — ABNORMAL LOW (ref >90)
GFR calc non Af Amer: 34 mL/min — ABNORMAL LOW (ref >90)
Glucose, Bld: 98 mg/dL (ref 70–99)
Potassium: 3.7 mEq/L (ref 3.5–5.1)

## 2012-01-09 LAB — GLUCOSE, CAPILLARY: Glucose-Capillary: 100 mg/dL — ABNORMAL HIGH (ref 70–99)

## 2012-01-09 MED ORDER — BISACODYL 10 MG RE SUPP
10.0000 mg | Freq: Once | RECTAL | Status: AC
  Administered 2012-01-09: 10 mg via RECTAL
  Filled 2012-01-09: qty 1

## 2012-01-09 MED ORDER — DSS 100 MG PO CAPS: 100.0000 mg | ORAL_CAPSULE | Freq: Two times a day (BID) | ORAL | Status: AC

## 2012-01-09 MED ORDER — ASPIRIN 325 MG PO TABS: 325.0000 mg | ORAL_TABLET | Freq: Every day | ORAL | Status: DC

## 2012-01-09 MED ORDER — LEVETIRACETAM 500 MG PO TABS: 500.0000 mg | ORAL_TABLET | Freq: Two times a day (BID) | ORAL | Status: DC

## 2012-01-09 MED ORDER — LABETALOL HCL 200 MG PO TABS: 200.0000 mg | ORAL_TABLET | Freq: Two times a day (BID) | ORAL | Status: AC

## 2012-01-09 MED ORDER — ENSURE COMPLETE PO LIQD: 237.0000 mL | Freq: Two times a day (BID) | ORAL | Status: DC

## 2012-01-09 MED ORDER — DOCUSATE SODIUM 100 MG PO CAPS
100.0000 mg | ORAL_CAPSULE | Freq: Two times a day (BID) | ORAL | Status: DC
  Administered 2012-01-09: 100 mg via ORAL

## 2012-01-09 NOTE — Progress Notes (Signed)
[facility] PROGRESS NOTE  Chris Simpson [id number] DOB: 05-12-1932 DOA: 01/04/2012 PCP: No primary provider on file.  Assessment/Plan: Principal Problem:  *Seizure Active Problems:  Hypertensive emergency  Acute respiratory failure  Altered mental status  Hypokalemia  CRI (chronic renal insufficiency)  Stroke  Aspiration pneumonitis   1. Acute CVA: Patient presented with malignant HTN, seizure and blindness. The clinical syndrome appeared initially consistent with posterior reversible encephalopathy syndrome (PRES) due to malignant hypertension (initial BP 210/110). Subsequent MRI showed scattered infarcts throughout the right hemisphere (could be in spectrum of PRES or cardioembolic). Initial neurology consultation was provided by Dr Leonel Ramsay, and patient was subsequently seen by Dr Andrey Spearman. Full stroke workup was done and patient was place on Aspirin 325 mg daily for secondary stroke prevention. 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 Echocardiogram showed estimated ejection fraction was in the range of 55% to 60%. Possible akinesis of the basal inferior myocardium and no cardiac source of emboli was indentified. 2. Seizure Disorder: This occurred on the setting of hypertensive emergency and acute CVAs. Patient was initially convulsing in status, ad even required intubation, for air-ways protection. He was initially managed with iv Dilantin, then subsequently switched to oral Keppra. He has had no further recurrences.  3. Malignant hypertension: This was evident on presentation, with BP of 210/110. It was addressed with iv infusion of Cardene, and woith adequate control, successfully weaned off. He has been managed with Labetolol 200 mg since 01/07/12, with satisfactory control.   4. Tobacco abuse; Patient is a cigarette smoker. He has been counseled appropriately.  5. Aspiration pneumonitis: This was suspected, based on clinical and CXR findings. Patient  was empirically treated with Unasyn, he remained afebrile, wcc normalized, and follow up CXR, showed no evidence of progression. Evaluation by SLP, showed no overt symptoms of aspiration. Antibiotics ere discontinued on 01/08/12.    Code Status: Full Code. Family Communication:  Disposition Plan: Stable for discharge to SNF.   Brief narrative: 78 y.o. male with history of HTN. Patient was with his nephew eating at a restaurant at 13:50 on 01/04/2012 when patient noted a flashing light in his vision. While he was in the restaurant he collapsed and had a seizure. Patient was emergently brought to the ED as a code stroke. Initial CT head was negative for stroke. Initial BP was 210/110 via EMS. While in ED his BP dropped and HR ranged between 30-40. After patient was brought back to [facility], patient began to show focal left sided seizure which became generalized. Patient was emergently intubated and given total of 4 mg Ativan. Dilantin was ordered, and patient was admitted to the ICU, under combined PCCM and neurologist care. BP was initially managed with ivi Nicardipine and patient was extubated on 01/08/12. He was transferred to [facility], effective 01/09/12.    Consultants:  Doree Fudge, PCCM  Dr Roland Rack, neurologist.  Procedures:  EEG  Head CT/Brain MRI  Neck MRA  2D Echocardiogram.   Antibiotics:  Unasyn 01/04/12-01/08/12.    HPI/Subjective: No new issues complains of constipation.   Objective: Vital signs in last 24 hours: Temp:  [97.4 F (36.3 C)-98.4 F (36.9 C)] 97.4 F (36.3 C) (08/14 0550) Pulse Rate:  [74-92] 76  (08/14 0550) Resp:  [18] 18  (08/14 0550) BP: (128-155)/(56-103) 128/56 mmHg (08/14 0550) SpO2:  [95 %-100 %] 100 % (08/14 0550) Weight change:  Last BM Date: 01/08/12  Intake/Output from previous day: 08/13 0701 - 08/14 0700  In: [id number] [P.O.:1080; I.V.:60] Out: -  Total I/O In: 240 [P.O.:240] Out: -    Physical  Exam: General: Comfortable, alert, communicative, fully oriented, not short of breath at rest.  HEENT:  No clinical pallor, no jaundice, no conjunctival injection or discharge. NECK:  Supple, JVP not seen, no carotid bruits, no palpable lymphadenopathy, no palpable goiter. Hydration status appears fair.  CHEST:  Clinically clear to auscultation, no wheezes, no crackles. HEART:  Sounds 1 and 2 heard, normal, regular, no murmurs. ABDOMEN:  Full, soft, no scars, non-tender, no palpable organomegaly, no palpable masses, normal bowel sounds. GENITALIA:  Not examined. LOWER EXTREMITIES:  No pitting edema, palpable peripheral pulses. MUSCULOSKELETAL SYSTEM:  Generalized osteoarthritic changes, otherwise, normal. CENTRAL NERVOUS SYSTEM:  No focal neurologic deficit on gross examination.  Lab Results:  Basename 01/08/12 1010  WBC 10.5  HGB 14.3  HCT 42.9  PLT 185    Basename 01/09/12 0705 01/08/12 1010  NA 141 140  K 3.7 3.8  CL 106 106  CO2 23 21  GLUCOSE 98 168*  BUN 24* 20  CREATININE 1.82* 1.61*  CALCIUM 8.9 9.1   Recent Results (from the past 240 hour(s))  URINE CULTURE     Status: Normal   Collection Time   01/04/12  3:44 PM      Component Value Range Status Comment   Specimen Description URINE, CATHETERIZED   Final    Special Requests ADDED [id number]   Final    Culture  Setup Time 01/05/2012 01:57   Final    Colony Count NO GROWTH   Final    Culture NO GROWTH   Final    Report Status 01/06/2012 FINAL   Final   CULTURE, BLOOD (ROUTINE X 2)     Status: Normal (Preliminary result)   Collection Time   01/04/12  6:00 PM      Component Value Range Status Comment   Specimen Description BLOOD HAND LEFT   Final    Special Requests BOTTLES DRAWN AEROBIC ONLY 5CC   Final    Culture  Setup Time 01/05/2012 02:13   Final    Culture     Final    Value:        BLOOD CULTURE RECEIVED NO GROWTH TO DATE CULTURE WILL BE HELD FOR 5 DAYS BEFORE ISSUING A FINAL NEGATIVE REPORT   Report Status  PENDING   Incomplete   CULTURE, BLOOD (ROUTINE X 2)     Status: Normal (Preliminary result)   Collection Time   01/04/12  6:10 PM      Component Value Range Status Comment   Specimen Description BLOOD HAND LEFT   Final    Special Requests BOTTLES DRAWN AEROBIC ONLY 3CC   Final    Culture  Setup Time 01/05/2012 02:13   Final    Culture     Final    Value:        BLOOD CULTURE RECEIVED NO GROWTH TO DATE CULTURE WILL BE HELD FOR 5 DAYS BEFORE ISSUING A FINAL NEGATIVE REPORT   Report Status PENDING   Incomplete   MRSA PCR SCREENING     Status: Normal   Collection Time   01/04/12  8:28 PM      Component Value Range Status Comment   MRSA by PCR NEGATIVE  NEGATIVE Final      Studies/Results: Mr Angiogram Neck W Wo Contrast  01/07/2012  *RADIOLOGY REPORT*  Clinical Data:  Multiple embolic infarctions.  Acute ill-defined vascular disease.  MRA  NECK WITH CONTRAST  Technique:  Angiographic images of the neck were obtained using MRA technique with intravenous contrast.  Carotid stenosis measurements (when applicable) are obtained utilizing NASCET criteria, using the distal internal carotid diameter as the denominator.  Contrast: 76mL MULTIHANCE GADOBENATE DIMEGLUMINE 529 MG/ML IV SOLN  Comparison:  MRI 08/08 02/2012  Findings:  Branching pattern of the brachiocephalic vessels from the arch is normal.  No origin stenoses.  The right common carotid artery is widely patent to the bifurcation.  The right carotid bifurcation appears normal without stenosis or irregularity.  Right internal carotid artery appears normal.  Left common carotid arteries widely patent to the bifurcation. There is smooth atherosclerotic plaque along the lateral wall of the distal internal carotid artery bulb.  Minimal diameter is 4 mm. Compared to a more distal cervical ICA diameter of 5 mm, this indicates a 20% stenosis.  More distal cervical ICA is normal.  Right vertebral artery shows mild stenosis affecting its proximal 1 cm.  Beyond  that, the vessel is widely patent to the basilar.  The left vertebral artery is occluded 2 cm beyond its origin. Reconstituted flow was seen at the foramen magnum.  IMPRESSION: No significant anterior circulation finding.  Occlusion of the left vertebral artery 2 cm beyond its origin. Reconstitution at the foramen magnum.  Original Report Authenticated By: Jules Schick, M.D.   Dg Swallowing Func-no Report  01/07/2012  CLINICAL DATA: dysphagia   FLUOROSCOPY FOR SWALLOWING FUNCTION STUDY:  Fluoroscopy was provided for swallowing function study, which was  administered by a speech pathologist.  Final results and recommendations  from this study are contained within the speech pathology report.      Medications: Scheduled Meds:    . aspirin  325 mg Oral Daily  . feeding supplement  237 mL Oral BID BM  . heparin subcutaneous  5,000 Units Subcutaneous Q8H  . insulin aspart  0-15 Units Subcutaneous TID WC  . insulin aspart  0-5 Units Subcutaneous QHS  . labetalol  200 mg Oral BID  . levETIRAcetam  500 mg Oral BID  . magnesium sulfate 1 - 4 g bolus IVPB  2 g Intravenous Once  . DISCONTD: antiseptic oral rinse  15 mL Mouth Rinse BID  . DISCONTD: phenytoin  100 mg Oral BID  . DISCONTD: phenytoin (DILANTIN) IV  100 mg Intravenous Q8H   Continuous Infusions:    . sodium chloride 20 mL/hr at 01/08/12 1000   PRN Meds:.acetaminophen (TYLENOL) oral liquid 160 mg/5 mL, acetaminophen, albuterol-ipratropium, hydrALAZINE, labetalol    LOS: 5 days   Chris Simpson,Chris Simpson  [facility] Pager [phone]. If 8PM-8AM, please contact night-coverage at [email or web address], password [facility] 01/09/2012, 9:33 AM  LOS: 5 days

## 2012-01-09 NOTE — Progress Notes (Signed)
Pt stable. Discharge teaching/education complete. Report called to [facility]. Will continue to monitor.

## 2012-01-09 NOTE — Care Management Note (Unsigned)
    Page 1 of 1   01/09/2012     4:27:18 PM   CARE MANAGEMENT NOTE 01/09/2012  Patient:  Chris Simpson, Chris Simpson   Account Number:  [id number]  Date Initiated:  01/08/2012  Documentation initiated by:  [facility]  Subjective/Objective Assessment:   Admitted with status epilepticus,resp failure, intubated.     Action/Plan:   PT eval-recommending inpatient rehab  OT eval-recommending inpatient rehab   Anticipated DC Date:  01/11/2012   Anticipated DC Plan:  [facility]  In-house referral  Clinical Social Worker         Choice offered to / List presented to:             Status of service:  In process, will continue to follow Medicare Important Message given?   (If response is "NO", the following Medicare IM given date fields will be blank) Date Medicare IM given:   Date Additional Medicare IM given:    Discharge Disposition:    Per UR Regulation:  Reviewed for med. necessity/level of care/duration of stay  If discussed at [place] of Stay Meetings, dates discussed:    Comments:  01/09/12 Patient to discharge to SNF today. Fuller Plan RN, BSN, CCM

## 2012-01-09 NOTE — Progress Notes (Signed)
RN was notified of abnormal BP

## 2012-01-09 NOTE — Clinical Social Work Note (Signed)
Pt is ready for discharge to [facility]. Facility has received discharge summary and is ready to admit pt. Pt and family are agreeable to discharge plan. [facility] will provide transportation to facility. CSW is signing off as no further needs identified.   Darden Dates, MSW, [place]

## 2012-01-09 NOTE — Discharge Summary (Addendum)
Physician Discharge Summary  HARSHAAN Simpson [id number] DOB: [birth year] DOA: 01/04/2012  PCP: Rosita Fire, MD  Admit date: 01/04/2012 Discharge date: 01/09/2012  Recommendations for Outpatient Follow-up:  1. Follow up with SNF MD and with Dr Antony Contras, stroke MD.  2. Follow up with PMD, Dr Rosita Fire.    Discharge Diagnoses:  Principal Problem:  *Seizure Active Problems:  Hypertensive emergency  Acute respiratory failure  Altered mental status  Hypokalemia  CRI (chronic renal insufficiency)  Stroke  Aspiration pneumonitis   Discharge Condition: Satisfactory.  Diet recommendation: Heart-healthy.  Filed Weights   01/05/12 0500 01/07/12 0500 01/08/12 0500  Weight: 73.6 kg (162 lb 4.1 oz) 61 kg (134 lb 7.7 oz) 58.9 kg (129 lb 13.6 oz)    History of present illness:  78 y.o. male with history of HTN. Patient was with his nephew eating at a restaurant at 13:50 on 01/04/2012 when patient noted a flashing light in his vision. While he was in the restaurant he collapsed and had a seizure. Patient was emergently brought to the ED as a code stroke. Initial CT head was negative for stroke. Initial BP was 210/110 via EMS. While in ED his BP dropped and HR ranged between 30-40. After patient was brought back to Trauma C, patient began to show focal left sided seizure which became generalized. Patient was emergently intubated and given total of 4 mg Ativan. Dilantin was ordered, and patient was admitted to the ICU, under combined PCCM and neurologist care. BP was initially managed with ivi Nicardipine and patient was extubated on 01/08/12. He was transferred to [facility], effective 01/09/12.    Hospital Course:  1. Acute CVA: Patient presented with malignant HTN, seizure and blindness. The clinical syndrome appeared initially consistent with posterior reversible encephalopathy syndrome (PRES) due to malignant hypertension (initial BP 210/110). Subsequent MRI showed scattered  infarcts throughout the right hemisphere (could be in spectrum of PRES or cardioembolic). Initial neurology consultation was provided by Dr Leonel Ramsay, and patient was subsequently seen by Dr Andrey Spearman. Full stroke workup was done and patient was place on Aspirin 325 mg daily for secondary stroke prevention. 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 Echocardiogram showed estimated ejection fraction was in the range of 55% to 60%. Possible akinesis of the basal inferior myocardium and no cardiac source of emboli was indentified.  2. Seizure Disorder: This occurred on the setting of hypertensive emergency and acute CVAs. Patient was initially convulsing in status, ad even required intubation, for air-ways protection. He was initially managed with iv Dilantin, then subsequently switched to oral Keppra. He has had no further recurrences.  3. Malignant hypertension: This was evident on presentation, with BP of 210/110. It was addressed with iv infusion of Cardene, and woith adequate control, successfully weaned off. He has been managed with Labetolol 200 mg since 01/07/12, with satisfactory control.  4. Tobacco abuse; Patient is a cigarette smoker. He has been counseled appropriately.  5. Aspiration pneumonitis: This was suspected, based on clinical and CXR findings. Patient was empirically treated with Unasyn, he remained afebrile, wcc normalized, and follow up CXR, showed no evidence of progression. Evaluation by SLP, showed no overt symptoms of aspiration. Antibiotics ere discontinued on 01/08/12.      Procedures:  See below.  Consultations: Doree Fudge, PCCM  Dr Roland Rack, neurologist.  Discharge Exam: Filed Vitals:   01/09/12 0550  BP: 128/56  Pulse: 76  Temp: 97.4 F (36.3 C)  Resp: 18   Filed Vitals:  01/08/12 2126 01/08/12 2130 01/09/12 0121 01/09/12 0550  BP: 155/80 155/80 135/71 128/56  Pulse: 84 84 79 76  Temp:  98.4 F (36.9 C) 98.4  F (36.9 C) 97.4 F (36.3 C)  TempSrc:  Oral Oral Oral  Resp:  18 18 18   Height:      Weight:      SpO2:  95% 100% 100%    General: Comfortable, alert, communicative, fully oriented, not short of breath at rest.  HEENT: No clinical pallor, no jaundice, no conjunctival injection or discharge.  NECK: Supple, JVP not seen, no carotid bruits, no palpable lymphadenopathy, no palpable goiter. Hydration status appears fair.  CHEST: Clinically clear to auscultation, no wheezes, no crackles.  HEART: Sounds 1 and 2 heard, normal, regular, no murmurs.  ABDOMEN: Full, soft, no scars, non-tender, no palpable organomegaly, no palpable masses, normal bowel sounds.  GENITALIA: Not examined.  LOWER EXTREMITIES: No pitting edema, palpable peripheral pulses.  MUSCULOSKELETAL SYSTEM: Generalized osteoarthritic changes, otherwise, normal.  CENTRAL NERVOUS SYSTEM: No focal neurologic deficit on gross examination.  Discharge Instructions  Discharge Orders    Future Orders Please Complete By Expires   Diet - low sodium heart healthy      Increase activity slowly        Medication List  As of 01/09/2012 10:06 AM   TAKE these medications         aspirin 325 MG tablet   Take 1 tablet (325 mg total) by mouth daily.      DSS 100 MG Caps   Take 100 mg by mouth 2 (two) times daily.      feeding supplement Liqd   Take 237 mLs by mouth 2 (two) times daily between meals.      labetalol 200 MG tablet   Commonly known as: NORMODYNE   Take 1 tablet (200 mg total) by mouth 2 (two) times daily.      levETIRAcetam 500 MG tablet   Commonly known as: KEPPRA   Take 1 tablet (500 mg total) by mouth 2 (two) times daily.           Follow-up Information    Follow up with [facility], MD. (As needed)    Contact information:   [address] [place]       Follow up with Forbes Cellar, MD in 1 month.   Contact information:   [address], [facility] [place] [phone]       Please follow up. (Follow up with SNF MD. )           The results of significant diagnostics from this hospitalization (including imaging, microbiology, ancillary and laboratory) are listed below for reference.    Significant Diagnostic Studies: Ct Head Wo Contrast  01/04/2012  *RADIOLOGY REPORT*  Clinical Data: New onset seizures.  Aphasia.  Code stroke.  CT HEAD WITHOUT CONTRAST  Technique:  Contiguous axial images were obtained from the base of the skull through the vertex without contrast.  Comparison: None.  Findings: Moderate cortical and mild deep atrophy.  Moderate cerebellar atrophy.  Old lacunar strokes in the thalami bilaterally and dating right basal ganglia.  No mass lesion.  No midline shift. No acute hemorrhage or hematoma.  No extra-axial fluid collections. No evidence of acute infarction.  No skull fracture or other focal osseous abnormality involving the skull.  Visualized paranasal sinuses, bilateral mastoid air cells, and bilateral middle ear cavities well-aerated.  Moderate  bilateral carotid siphon atherosclerosis.  IMPRESSION:  1.  No acute intracranial abnormality. 2.  Moderate generalized atrophy. 3.  Old lacunar strokes in the thalami bilaterally and the right basal ganglia.  These results were called by telephone on 01/04/2012 at 1500 hours to Dr. Jeanell Sparrow of the emergency department, who verbally acknowledged these results.  Original Report Authenticated By: Deniece Portela, M.D.   Mr [facility] Wo Contrast  01/05/2012  *RADIOLOGY REPORT*  Clinical Data:  Hypertensive patient is presenting with hypertensive urgency.  Seizure-like activity.  MRI BRAIN WITHOUT CONTRAST MRA HEAD WITHOUT CONTRAST  Technique: Multiplanar, multiecho pulse sequences of the brain and surrounding structures were obtained according to standard protocol without intravenous contrast.  Angiographic images of the head  were obtained using MRA technique without contrast.  Comparison: 01/04/2012 head CT.  No comparison brain MR.  MRI HEAD  Findings:  Several acute predominantly small non hemorrhagic infarcts span throughout the right hemisphere involving posterior right frontal lobe, right parietal lobe, right occipital lobe and posterior right temporal lobe with small acute infarct at the junction of the right thalamus and posterior limb of the right internal capsule. Largest acute infarct medial aspect of the right occipital lobe.  No intracranial hemorrhage.  Remote right corona radiata infarcts.  Small remote left thalamic and basal ganglia infarcts.  Remote small right cerebellar infarct.  Moderate small vessel disease type changes.  Global atrophy without hydrocephalus.  8 x 7.9 x 5.6 mm mass projects from the right pituitary gland to the undersurface of the optic nerves/optic chiasm.  Etiology indeterminate.  This may represent a primary pituitary microadenoma with suprasellar extension although a Rathke cleft cyst or a craniopharyngioma are not entirely excluded.  Paranasal sinus mucosal thickening / opacification with air fluid level/polypoid opacification right maxillary sinus.  Cervical spondylotic changes with spinal stenosis and mild cord flattening C3-4 and C4-5.  Prominence of the superior ophthalmic veins slightly more notable on the left.  No obvious abnormality of the cavernous sinus detected as contributing to this finding.  IMPRESSION:  Several acute predominantly small non hemorrhagic infarcts span throughout the right hemisphere involving posterior right frontal lobe, right parietal lobe, right occipital lobe and posterior right temporal lobe with small acute infarct at the junction of the right thalamus and posterior limb of the right internal capsule. Largest acute infarct medial aspect of the right occipital lobe.  No intracranial hemorrhage.  Remote right corona radiata infarcts.  Small remote left thalamic  and basal ganglia infarcts.  Remote small right cerebellar infarct.  Moderate small vessel disease type changes.  Global atrophy without hydrocephalus.  8 x 7.9 x 5.6 mm mass projects from the right pituitary gland to the undersurface of the optic nerves/optic chiasm.  Etiology indeterminate.  This may represent a primary pituitary microadenoma with suprasellar extension although a Rathke cleft cyst or a craniopharyngioma are not entirely excluded.  Paranasal sinus mucosal thickening / opacification with air fluid level/polypoid opacification right maxillary sinus.  Cervical spondylotic changes with spinal stenosis and mild cord flattening C3-4 and C4-5.  MRA HEAD  Findings: Left vertebral artery is occluded.  There is a collateral flow within the distal left vertebral artery and left PICA. Left PICA is narrowed and irregular.  Mild narrowing right vertebral artery without high-grade stenosis. Small right PICA.  No high-grade stenosis of the basilar artery.  Nonvisualization right AICA and only small portion left AICA visualized.  Moderate tandem stenosis of the superior cerebellar artery bilaterally.  Moderate to marked  narrowing of portions of the posterior cerebral arteries most notable mid to distal aspect although seen proximally as well.  Broad-based bulge of the proximal cavernous segment of the right internal carotid artery may be related to the atherosclerotic type changes although blister aneurysm not entirely excluded.  Mild narrowing of portions of the supraclinoid segment of the internal carotid artery bilaterally.  Mild narrowing portions of the A1 segment of the anterior cerebral artery bilaterally.  Middle cerebral artery and anterior cerebral artery branch vessel irregularity bilaterally.  No obvious aneurysm or vascular malformation.  IMPRESSION:  Left vertebral artery is occluded.  There is a collateral flow within the distal left vertebral artery and left PICA. Left PICA is narrowed and  irregular.  Mild narrowing right vertebral artery without high-grade stenosis. Small right PICA.  No high-grade stenosis of the basilar artery.  Nonvisualization right AICA and only small portion left AICA visualized.  Moderate tandem stenosis of the superior cerebellar artery bilaterally.  Moderate to marked narrowing of portions of the posterior cerebral arteries most notable mid to distal aspect although seen proximally as well.  Broad-based bulge of the proximal cavernous segment of the right internal carotid artery may be related to the atherosclerotic type changes although blister aneurysm not entirely excluded.  Mild narrowing of portions of the supraclinoid segment of the internal carotid artery bilaterally.  Mild narrowing portions of the A1 segment of the anterior cerebral artery bilaterally.  Middle cerebral artery and anterior cerebral artery branch vessel irregularity bilaterally.  This has been made a PRA call report utilizing dashboard call feature.  Original Report Authenticated By: Doug Sou, M.D.   Mr Angiogram Neck W Wo Contrast  01/07/2012  *RADIOLOGY REPORT*  Clinical Data:  Multiple embolic infarctions.  Acute ill-defined vascular disease.  MRA NECK WITH CONTRAST  Technique:  Angiographic images of the neck were obtained using MRA technique with intravenous contrast.  Carotid stenosis measurements (when applicable) are obtained utilizing NASCET criteria, using the distal internal carotid diameter as the denominator.  Contrast: 34mL MULTIHANCE GADOBENATE DIMEGLUMINE 529 MG/ML IV SOLN  Comparison:  MRI 08/08 02/2012  Findings:  Branching pattern of the brachiocephalic vessels from the arch is normal.  No origin stenoses.  The right common carotid artery is widely patent to the bifurcation.  The right carotid bifurcation appears normal without stenosis or irregularity.  Right internal carotid artery appears normal.  Left common carotid arteries widely patent to the bifurcation. There is  smooth atherosclerotic plaque along the lateral wall of the distal internal carotid artery bulb.  Minimal diameter is 4 mm. Compared to a more distal cervical ICA diameter of 5 mm, this indicates a 20% stenosis.  More distal cervical ICA is normal.  Right vertebral artery shows mild stenosis affecting its proximal 1 cm.  Beyond that, the vessel is widely patent to the basilar.  The left vertebral artery is occluded 2 cm beyond its origin. Reconstituted flow was seen at the foramen magnum.  IMPRESSION: No significant anterior circulation finding.  Occlusion of the left vertebral artery 2 cm beyond its origin. Reconstitution at the foramen magnum.  Original Report Authenticated By: Jules Schick, M.D.   Mr Brain Wo Contrast  01/05/2012  *RADIOLOGY REPORT*  Clinical Data:  Hypertensive patient is presenting with hypertensive urgency.  Seizure-like activity.  MRI BRAIN WITHOUT CONTRAST MRA HEAD WITHOUT CONTRAST  Technique: Multiplanar, multiecho pulse sequences of the brain and surrounding structures were obtained according to standard protocol without intravenous contrast.  Angiographic images  of the head were obtained using MRA technique without contrast.  Comparison: 01/04/2012 head CT.  No comparison brain MR.  MRI HEAD  Findings:  Several acute predominantly small non hemorrhagic infarcts span throughout the right hemisphere involving posterior right frontal lobe, right parietal lobe, right occipital lobe and posterior right temporal lobe with small acute infarct at the junction of the right thalamus and posterior limb of the right internal capsule. Largest acute infarct medial aspect of the right occipital lobe.  No intracranial hemorrhage.  Remote right corona radiata infarcts.  Small remote left thalamic and basal ganglia infarcts.  Remote small right cerebellar infarct.  Moderate small vessel disease type changes.  Global atrophy without hydrocephalus.  8 x 7.9 x 5.6 mm mass projects from the right  pituitary gland to the undersurface of the optic nerves/optic chiasm.  Etiology indeterminate.  This may represent a primary pituitary microadenoma with suprasellar extension although a Rathke cleft cyst or a craniopharyngioma are not entirely excluded.  Paranasal sinus mucosal thickening / opacification with air fluid level/polypoid opacification right maxillary sinus.  Cervical spondylotic changes with spinal stenosis and mild cord flattening C3-4 and C4-5.  Prominence of the superior ophthalmic veins slightly more notable on the left.  No obvious abnormality of the cavernous sinus detected as contributing to this finding.  IMPRESSION:  Several acute predominantly small non hemorrhagic infarcts span throughout the right hemisphere involving posterior right frontal lobe, right parietal lobe, right occipital lobe and posterior right temporal lobe with small acute infarct at the junction of the right thalamus and posterior limb of the right internal capsule. Largest acute infarct medial aspect of the right occipital lobe.  No intracranial hemorrhage.  Remote right corona radiata infarcts.  Small remote left thalamic and basal ganglia infarcts.  Remote small right cerebellar infarct.  Moderate small vessel disease type changes.  Global atrophy without hydrocephalus.  8 x 7.9 x 5.6 mm mass projects from the right pituitary gland to the undersurface of the optic nerves/optic chiasm.  Etiology indeterminate.  This may represent a primary pituitary microadenoma with suprasellar extension although a Rathke cleft cyst or a craniopharyngioma are not entirely excluded.  Paranasal sinus mucosal thickening / opacification with air fluid level/polypoid opacification right maxillary sinus.  Cervical spondylotic changes with spinal stenosis and mild cord flattening C3-4 and C4-5.  MRA HEAD  Findings: Left vertebral artery is occluded.  There is a collateral flow within the distal left vertebral artery and left PICA. Left PICA is  narrowed and irregular.  Mild narrowing right vertebral artery without high-grade stenosis. Small right PICA.  No high-grade stenosis of the basilar artery.  Nonvisualization right AICA and only small portion left AICA visualized.  Moderate tandem stenosis of the superior cerebellar artery bilaterally.  Moderate to marked narrowing of portions of the posterior cerebral arteries most notable mid to distal aspect although seen proximally as well.  Broad-based bulge of the proximal cavernous segment of the right internal carotid artery may be related to the atherosclerotic type changes although blister aneurysm not entirely excluded.  Mild narrowing of portions of the supraclinoid segment of the internal carotid artery bilaterally.  Mild narrowing portions of the A1 segment of the anterior cerebral artery bilaterally.  Middle cerebral artery and anterior cerebral artery branch vessel irregularity bilaterally.  No obvious aneurysm or vascular malformation.  IMPRESSION:  Left vertebral artery is occluded.  There is a collateral flow within the distal left vertebral artery and left PICA. Left PICA is narrowed and irregular.  Mild narrowing right vertebral artery without high-grade stenosis. Small right PICA.  No high-grade stenosis of the basilar artery.  Nonvisualization right AICA and only small portion left AICA visualized.  Moderate tandem stenosis of the superior cerebellar artery bilaterally.  Moderate to marked narrowing of portions of the posterior cerebral arteries most notable mid to distal aspect although seen proximally as well.  Broad-based bulge of the proximal cavernous segment of the right internal carotid artery may be related to the atherosclerotic type changes although blister aneurysm not entirely excluded.  Mild narrowing of portions of the supraclinoid segment of the internal carotid artery bilaterally.  Mild narrowing portions of the A1 segment of the anterior cerebral artery bilaterally.  Middle  cerebral artery and anterior cerebral artery branch vessel irregularity bilaterally.  This has been made a PRA call report utilizing dashboard call feature.  Original Report Authenticated By: Doug Sou, M.D.   Dg Chest Port 1 View  01/05/2012  *RADIOLOGY REPORT*  Clinical Data: Endotracheal tube placement.  PORTABLE CHEST - 1 VIEW  Comparison: Chest x-ray 01/04/2012.  Findings: An endotracheal tube is in place with tip 5.1 cm above the carina. Nasogastric tube extends into the proximal stomach. Lung volumes are lower limits of normal.  Minimal left lower lobe subsegmental atelectasis.  Possible trace left pleural effusion. Pulmonary vasculature is normal.  Cardiomediastinal silhouette is within normal limits.  IMPRESSION: 1.  Support apparatus, as above. 2.  Minimal left lower lobe subsegmental atelectasis and probable small left pleural effusion.  Original Report Authenticated By: Etheleen Mayhew, M.D.   Dg Chest Port 1 View  01/04/2012  *RADIOLOGY REPORT*  Clinical Data: Post procedure  PORTABLE CHEST - 1 VIEW  Comparison: 01/04/2012  Findings: Endotracheal tube tip unchanged in position, abutting the right margin of the trachea however located proximal to the carina. An NG tube descends into the abdomen, tip not visualized. Cardiomediastinal contours are unchanged.  Mild retrocardiac /lung base opacities. The lung apices are poorly visualized due to overlying structures.  No pneumothorax or pleural effusion.  No interval osseous change.  IMPRESSION: Endotracheal tube is unchanged in position.  Mild left greater than right lung base opacities, likely atelectasis.  Original Report Authenticated By: Suanne Marker, M.D.   Dg Chest Portable 1 View  01/04/2012  *RADIOLOGY REPORT*  Clinical Data: Endotracheal tube placement.  PORTABLE CHEST - 1 VIEW  Comparison: 03/20/2006.  Findings: Endotracheal tube is present with the tip 2.4 cm from the carina.  Lung volumes slightly low with basilar  atelectasis.  No airspace disease.  No effusion. Monitoring leads are projected over the chest. Poor cardiopericardial silhouette within normal limits.  IMPRESSION: Endotracheal tube tip 24 mm from the carina.  Original Report Authenticated By: Dereck Ligas, M.D.   Dg Swallowing Func-no Report  01/07/2012  CLINICAL DATA: dysphagia   FLUOROSCOPY FOR SWALLOWING FUNCTION STUDY:  Fluoroscopy was provided for swallowing function study, which was  administered by a speech pathologist.  Final results and recommendations  from this study are contained within the speech pathology report.      Microbiology: Recent Results (from the past 240 hour(s))  URINE CULTURE     Status: Normal   Collection Time   01/04/12  3:44 PM      Component Value Range Status Comment   Specimen Description URINE, CATHETERIZED   Final    Special Requests ADDED [id number]   Final    Culture  Setup Time 01/05/2012 01:57   Final  Colony Count NO GROWTH   Final    Culture NO GROWTH   Final    Report Status 01/06/2012 FINAL   Final   CULTURE, BLOOD (ROUTINE X 2)     Status: Normal (Preliminary result)   Collection Time   01/04/12  6:00 PM      Component Value Range Status Comment   Specimen Description BLOOD HAND LEFT   Final    Special Requests BOTTLES DRAWN AEROBIC ONLY 5CC   Final    Culture  Setup Time 01/05/2012 02:13   Final    Culture     Final    Value:        BLOOD CULTURE RECEIVED NO GROWTH TO DATE CULTURE WILL BE HELD FOR 5 DAYS BEFORE ISSUING A FINAL NEGATIVE REPORT   Report Status PENDING   Incomplete   CULTURE, BLOOD (ROUTINE X 2)     Status: Normal (Preliminary result)   Collection Time   01/04/12  6:10 PM      Component Value Range Status Comment   Specimen Description BLOOD HAND LEFT   Final    Special Requests BOTTLES DRAWN AEROBIC ONLY 3CC   Final    Culture  Setup Time 01/05/2012 02:13   Final    Culture     Final    Value:        BLOOD CULTURE RECEIVED NO GROWTH TO DATE CULTURE WILL BE HELD FOR 5  DAYS BEFORE ISSUING A FINAL NEGATIVE REPORT   Report Status PENDING   Incomplete   MRSA PCR SCREENING     Status: Normal   Collection Time   01/04/12  8:28 PM      Component Value Range Status Comment   MRSA by PCR NEGATIVE  NEGATIVE Final      Labs: Basic Metabolic Panel:  Lab [id number] 01/08/12 1010 01/07/12 0521 01/06/12 1412 01/06/12 0908 01/05/12 0533 01/04/12 2013 01/04/12 1502  NA 141 140 140 140 141 -- -- --  K 3.7 3.8 4.1 4.2 4.1 -- -- --  CL 106 106 105 106 110 -- -- --  CO2 23 21 20 21  18* -- -- --  GLUCOSE 98 168* 150* 124* 103* -- -- --  BUN 24* 20 17 12 12  -- -- --  CREATININE 1.82* 1.61* 1.49* 1.48* 1.52* -- -- --  CALCIUM 8.9 9.1 9.3 9.2 8.7 -- -- --  MG -- 1.8 1.7 -- 1.6 1.6 1.7 --  PHOS -- -- -- -- 2.9 1.7* 2.3 2.8   Liver Function Tests:  Lab 01/05/12 0533 01/04/12 2013 01/04/12 1502  AST -- 18 16  ALT -- 8 8  ALKPHOS -- 82 84  BILITOT -- 0.5 0.3  PROT -- 7.1 7.7  ALBUMIN 2.8* 3.2* 3.5   No results found for this basename: LIPASE:5,AMYLASE:5 in the last 168 hours No results found for this basename: AMMONIA:5 in the last 168 hours CBC:  Lab 01/08/12 1010 01/05/12 0533 01/04/12 2013 01/04/12 1502 01/04/12 1458  WBC 10.5 10.9* 13.6* 12.9* --  NEUTROABS -- -- 10.7* 8.3* --  HGB 14.3 13.7 14.1 14.8 16.7  HCT 42.9 42.1 43.0 45.6 49.0  MCV 94.7 96.1 94.7 95.6 --  PLT 185 187 220 271 --   Cardiac Enzymes:  Lab 01/04/12 1445  CKTOTAL 99  CKMB 1.6  CKMBINDEX --  TROPONINI <0.30   BNP: BNP (last 3 results)  Basename 01/04/12 2013  PROBNP 547.8*   CBG:  Lab 01/09/12 0654 01/08/12 2135 01/08/12 1630 01/08/12  1141 01/08/12 0811  GLUCAP 100* 120* 113* 169* 79    Time coordinating discharge: 45 minutes  Signed:  Dnya Hickle,CHRISTOPHER  [facility] 01/09/2012, 10:06 AM

## 2012-01-09 NOTE — Clinical Social Work Note (Signed)
CSW met with pt to provide bed offers. Pt shared that he will think about the facility as he had a few bed offers. CSW will follow up with pt. CSW will continue to follow to facilitate discharge to facility of his choice.   Darden Dates, MSW, [place]

## 2012-01-09 NOTE — Progress Notes (Signed)
Speech Language Pathology Dysphagia Treatment Patient Details Name: Chris Simpson MRN: [id number] DOB: 1932/02/05 Today's Date: 01/09/2012 Time: [id number] SLP Time Calculation (min): 15 min  Assessment / Plan / Recommendation Clinical Impression  Rn informed SLP yesterady that she was concerned about pt coughing, decreased breath sounds. SLP returned to check pts [place] with thin and solid POs. Required min verbal cues to follow small sips strategy though he verbalized with question cues. Also min verbal cues for lingual sweep to buccal cavity. Pt appears functional with diet when provided with supervision. Continue current diet. SLP to follow at next level of care.     Diet Recommendation  Continue with Current Diet: Thin liquid;Regular    SLP Plan Continue with current plan of care   Pertinent Vitals/Pain NA   Swallowing Goals  SLP Swallowing Goals Patient will consume recommended diet without observed clinical signs of aspiration with: Supervision/safety;Modified independent assistance Swallow Study Goal #1 - Progress: Progressing toward goal Patient will utilize recommended strategies during swallow to increase swallowing safety with: Minimal assistance Swallow Study Goal #2 - Progress: Progressing toward goal  General Temperature Spikes Noted: No Respiratory Status: Room air Behavior/Cognition: Cooperative;Alert Oral Cavity - Dentition: Adequate natural dentition;Missing dentition;Poor condition Patient Positioning: Upright in bed  Oral Cavity - Oral Hygiene Brush patient's teeth BID with toothbrush (using toothpaste with fluoride): Yes   Dysphagia Treatment Treatment focused on: Skilled observation of diet tolerance;Patient/family/caregiver education;Utilization of compensatory strategies Treatment Methods/Modalities: Skilled observation Patient observed directly with PO's: Yes Type of PO's observed: Regular;Thin liquids Feeding: Able to feed self Liquids provided via:  Cup;Straw Oral Phase Signs & Symptoms: Prolonged bolus formation;Prolonged oral phase Type of cueing: Verbal Amount of cueing: Minimal   GO     Chris Simpson, Chris Simpson 01/09/2012, 10:53 AM

## 2012-01-11 LAB — CULTURE, BLOOD (ROUTINE X 2)

## 2012-01-16 DIAGNOSIS — I6789 Other cerebrovascular disease: Secondary | ICD-10-CM | POA: Diagnosis not present

## 2012-01-16 DIAGNOSIS — I10 Essential (primary) hypertension: Secondary | ICD-10-CM | POA: Diagnosis not present

## 2012-01-16 DIAGNOSIS — R569 Unspecified convulsions: Secondary | ICD-10-CM | POA: Diagnosis not present

## 2012-01-16 DIAGNOSIS — R413 Other amnesia: Secondary | ICD-10-CM | POA: Diagnosis not present

## 2012-02-01 DIAGNOSIS — IMO0001 Reserved for inherently not codable concepts without codable children: Secondary | ICD-10-CM | POA: Diagnosis not present

## 2012-02-01 DIAGNOSIS — R262 Difficulty in walking, not elsewhere classified: Secondary | ICD-10-CM | POA: Diagnosis not present

## 2012-02-01 DIAGNOSIS — R569 Unspecified convulsions: Secondary | ICD-10-CM | POA: Diagnosis not present

## 2012-02-01 DIAGNOSIS — H548 Legal blindness, as defined in USA: Secondary | ICD-10-CM | POA: Diagnosis not present

## 2012-02-01 DIAGNOSIS — N189 Chronic kidney disease, unspecified: Secondary | ICD-10-CM | POA: Diagnosis not present

## 2012-02-01 DIAGNOSIS — I129 Hypertensive chronic kidney disease with stage 1 through stage 4 chronic kidney disease, or unspecified chronic kidney disease: Secondary | ICD-10-CM | POA: Diagnosis not present

## 2012-02-01 DIAGNOSIS — I69998 Other sequelae following unspecified cerebrovascular disease: Secondary | ICD-10-CM | POA: Diagnosis not present

## 2012-02-04 DIAGNOSIS — R262 Difficulty in walking, not elsewhere classified: Secondary | ICD-10-CM | POA: Diagnosis not present

## 2012-02-04 DIAGNOSIS — I129 Hypertensive chronic kidney disease with stage 1 through stage 4 chronic kidney disease, or unspecified chronic kidney disease: Secondary | ICD-10-CM | POA: Diagnosis not present

## 2012-02-04 DIAGNOSIS — I69998 Other sequelae following unspecified cerebrovascular disease: Secondary | ICD-10-CM | POA: Diagnosis not present

## 2012-02-04 DIAGNOSIS — IMO0001 Reserved for inherently not codable concepts without codable children: Secondary | ICD-10-CM | POA: Diagnosis not present

## 2012-02-04 DIAGNOSIS — H548 Legal blindness, as defined in USA: Secondary | ICD-10-CM | POA: Diagnosis not present

## 2012-02-04 DIAGNOSIS — R569 Unspecified convulsions: Secondary | ICD-10-CM | POA: Diagnosis not present

## 2012-02-11 DIAGNOSIS — I10 Essential (primary) hypertension: Secondary | ICD-10-CM | POA: Diagnosis not present

## 2012-02-11 DIAGNOSIS — R5383 Other fatigue: Secondary | ICD-10-CM | POA: Diagnosis not present

## 2012-02-11 DIAGNOSIS — R269 Unspecified abnormalities of gait and mobility: Secondary | ICD-10-CM | POA: Diagnosis not present

## 2012-02-11 DIAGNOSIS — R569 Unspecified convulsions: Secondary | ICD-10-CM | POA: Diagnosis not present

## 2012-02-11 DIAGNOSIS — R5381 Other malaise: Secondary | ICD-10-CM | POA: Diagnosis not present

## 2012-02-11 DIAGNOSIS — R634 Abnormal weight loss: Secondary | ICD-10-CM | POA: Diagnosis not present

## 2012-02-13 DIAGNOSIS — I129 Hypertensive chronic kidney disease with stage 1 through stage 4 chronic kidney disease, or unspecified chronic kidney disease: Secondary | ICD-10-CM | POA: Diagnosis not present

## 2012-02-13 DIAGNOSIS — R569 Unspecified convulsions: Secondary | ICD-10-CM | POA: Diagnosis not present

## 2012-02-13 DIAGNOSIS — R262 Difficulty in walking, not elsewhere classified: Secondary | ICD-10-CM | POA: Diagnosis not present

## 2012-02-13 DIAGNOSIS — I69998 Other sequelae following unspecified cerebrovascular disease: Secondary | ICD-10-CM | POA: Diagnosis not present

## 2012-02-13 DIAGNOSIS — IMO0001 Reserved for inherently not codable concepts without codable children: Secondary | ICD-10-CM | POA: Diagnosis not present

## 2012-02-13 DIAGNOSIS — H548 Legal blindness, as defined in USA: Secondary | ICD-10-CM | POA: Diagnosis not present

## 2012-02-15 DIAGNOSIS — I69998 Other sequelae following unspecified cerebrovascular disease: Secondary | ICD-10-CM | POA: Diagnosis not present

## 2012-02-15 DIAGNOSIS — H548 Legal blindness, as defined in USA: Secondary | ICD-10-CM | POA: Diagnosis not present

## 2012-02-15 DIAGNOSIS — R262 Difficulty in walking, not elsewhere classified: Secondary | ICD-10-CM | POA: Diagnosis not present

## 2012-02-15 DIAGNOSIS — IMO0001 Reserved for inherently not codable concepts without codable children: Secondary | ICD-10-CM | POA: Diagnosis not present

## 2012-02-15 DIAGNOSIS — R569 Unspecified convulsions: Secondary | ICD-10-CM | POA: Diagnosis not present

## 2012-02-15 DIAGNOSIS — I129 Hypertensive chronic kidney disease with stage 1 through stage 4 chronic kidney disease, or unspecified chronic kidney disease: Secondary | ICD-10-CM | POA: Diagnosis not present

## 2012-02-20 ENCOUNTER — Encounter (HOSPITAL_COMMUNITY): Payer: Self-pay | Admitting: *Deleted

## 2012-02-20 ENCOUNTER — Inpatient Hospital Stay (HOSPITAL_COMMUNITY)
Admission: EM | Admit: 2012-02-20 | Discharge: 2012-02-22 | DRG: 684 | Disposition: A | Discharge status: Home / Self Care | Payer: Medicare Other | Attending: Internal Medicine | Admitting: Internal Medicine

## 2012-02-20 DIAGNOSIS — Z79899 Other long term (current) drug therapy: Secondary | ICD-10-CM

## 2012-02-20 DIAGNOSIS — M47812 Spondylosis without myelopathy or radiculopathy, cervical region: Secondary | ICD-10-CM | POA: Diagnosis present

## 2012-02-20 DIAGNOSIS — I69998 Other sequelae following unspecified cerebrovascular disease: Secondary | ICD-10-CM | POA: Diagnosis not present

## 2012-02-20 DIAGNOSIS — I129 Hypertensive chronic kidney disease with stage 1 through stage 4 chronic kidney disease, or unspecified chronic kidney disease: Secondary | ICD-10-CM | POA: Diagnosis present

## 2012-02-20 DIAGNOSIS — N189 Chronic kidney disease, unspecified: Secondary | ICD-10-CM | POA: Diagnosis not present

## 2012-02-20 DIAGNOSIS — Z8701 Personal history of pneumonia (recurrent): Secondary | ICD-10-CM

## 2012-02-20 DIAGNOSIS — IMO0001 Reserved for inherently not codable concepts without codable children: Secondary | ICD-10-CM | POA: Diagnosis not present

## 2012-02-20 DIAGNOSIS — I161 Hypertensive emergency: Secondary | ICD-10-CM

## 2012-02-20 DIAGNOSIS — R220 Localized swelling, mass and lump, head: Secondary | ICD-10-CM | POA: Diagnosis present

## 2012-02-20 DIAGNOSIS — R262 Difficulty in walking, not elsewhere classified: Secondary | ICD-10-CM | POA: Diagnosis not present

## 2012-02-20 DIAGNOSIS — H548 Legal blindness, as defined in USA: Secondary | ICD-10-CM | POA: Diagnosis not present

## 2012-02-20 DIAGNOSIS — Z8673 Personal history of transient ischemic attack (TIA), and cerebral infarction without residual deficits: Secondary | ICD-10-CM

## 2012-02-20 DIAGNOSIS — I672 Cerebral atherosclerosis: Secondary | ICD-10-CM | POA: Diagnosis present

## 2012-02-20 DIAGNOSIS — G40909 Epilepsy, unspecified, not intractable, without status epilepticus: Secondary | ICD-10-CM | POA: Diagnosis not present

## 2012-02-20 DIAGNOSIS — I10 Essential (primary) hypertension: Secondary | ICD-10-CM | POA: Diagnosis not present

## 2012-02-20 DIAGNOSIS — R569 Unspecified convulsions: Secondary | ICD-10-CM | POA: Diagnosis not present

## 2012-02-20 DIAGNOSIS — Z7982 Long term (current) use of aspirin: Secondary | ICD-10-CM | POA: Diagnosis not present

## 2012-02-20 DIAGNOSIS — I6789 Other cerebrovascular disease: Secondary | ICD-10-CM | POA: Diagnosis not present

## 2012-02-20 DIAGNOSIS — I6350 Cerebral infarction due to unspecified occlusion or stenosis of unspecified cerebral artery: Secondary | ICD-10-CM | POA: Diagnosis not present

## 2012-02-20 HISTORY — DX: Cerebral infarction, unspecified: I63.9

## 2012-02-20 HISTORY — DX: Cardiac arrest, cause unspecified: I46.9

## 2012-02-20 LAB — URINALYSIS, ROUTINE W REFLEX MICROSCOPIC
Glucose, UA: NEGATIVE mg/dL
Ketones, ur: NEGATIVE mg/dL
Leukocytes, UA: NEGATIVE
Nitrite: NEGATIVE
Protein, ur: 30 mg/dL — AB

## 2012-02-20 LAB — CBC WITH DIFFERENTIAL/PLATELET
Basophils Relative: 0 % (ref 0–1)
Eosinophils Absolute: 0.4 10*3/uL (ref 0.0–0.7)
Eosinophils Relative: 5 % (ref 0–5)
HCT: 44.9 % (ref 39.0–52.0)
Hemoglobin: 14.8 g/dL (ref 13.0–17.0)
MCH: 31.8 pg (ref 26.0–34.0)
MCHC: 33 g/dL (ref 30.0–36.0)
Monocytes Absolute: 0.6 10*3/uL (ref 0.1–1.0)
Monocytes Relative: 7 % (ref 3–12)

## 2012-02-20 MED ORDER — CLONIDINE HCL 0.1 MG PO TABS
0.1000 mg | ORAL_TABLET | Freq: Once | ORAL | Status: AC
  Filled 2012-02-20: qty 1

## 2012-02-20 MED ORDER — LEVETIRACETAM 500 MG PO TABS
500.0000 mg | ORAL_TABLET | Freq: Two times a day (BID) | ORAL | Status: DC
  Administered 2012-02-20 – 2012-02-22 (×4): 500 mg via ORAL
  Filled 2012-02-20 (×4): qty 1

## 2012-02-20 MED ORDER — LABETALOL HCL 200 MG PO TABS
200.0000 mg | ORAL_TABLET | Freq: Two times a day (BID) | ORAL | Status: DC
  Administered 2012-02-21 – 2012-02-22 (×3): 200 mg via ORAL
  Filled 2012-02-20 (×3): qty 1

## 2012-02-20 MED ORDER — NICARDIPINE HCL IN NACL 20-0.86 MG/200ML-% IV SOLN
5.0000 mg/h | Freq: Once | INTRAVENOUS | Status: AC
  Administered 2012-02-20: 5 mg/h via INTRAVENOUS
  Filled 2012-02-20: qty 200

## 2012-02-20 MED ORDER — SODIUM CHLORIDE 0.9 % IV SOLN
INTRAVENOUS | Status: DC
  Administered 2012-02-20: 19:00:00 via INTRAVENOUS

## 2012-02-20 MED ORDER — ACETAMINOPHEN 500 MG PO TABS: 500.0000 mg | ORAL_TABLET | ORAL | Status: DC | PRN

## 2012-02-20 MED ORDER — CLONIDINE HCL 0.1 MG PO TABS: 0.1000 mg | ORAL_TABLET | ORAL | Status: DC | PRN

## 2012-02-20 MED ORDER — LABETALOL HCL 200 MG PO TABS
200.0000 mg | ORAL_TABLET | Freq: Once | ORAL | Status: AC
  Administered 2012-02-20: 200 mg via ORAL
  Filled 2012-02-20: qty 1

## 2012-02-20 MED ORDER — ENOXAPARIN SODIUM 30 MG/0.3ML ~~LOC~~ SOLN
30.0000 mg | SUBCUTANEOUS | Status: DC
  Administered 2012-02-20: 30 mg via SUBCUTANEOUS
  Filled 2012-02-20: qty 0.3

## 2012-02-20 NOTE — ED Provider Notes (Cosign Needed)
History    This chart was scribed for Chris Norrie, MD, MD by Chris Simpson. The patient was seen in room APA01 and the patient's care was started at 3:11PM.   CSN: [id number]  Arrival date & time 02/20/12  1401      Chief Complaint  Patient presents with  . Hypertension    (Consider location/radiation/quality/duration/timing/severity/associated sxs/prior treatment) The history is provided by the patient and a relative. No language interpreter was used.   Chris Simpson is a 78 y.o. male who presents to the Emergency Department due to HTN today. Pt has PT at home today and the PT checked his BP and called PCP (Dr. Legrand Simpson) due to his BP being elevated (186/82). Patient reports the last time he had his blood pressure checked was a couple of days ago and his blood pressure has been fine since he has been in rehabilitation and he's been home. PCP told pt to come to hospital. Pt was discharged from rehab 1 week ago due to CVA. Family reports that on 01/05/12 pt started having visual disturbances, having seizures, confusion and his BP 220/130. Pt was taken to [facility] and was on ventilator. Pt had PT at facility for 21 days. Pt denies headache, SOB, numbness, chest pain, weakness and any other pain currently. Pt took HTN medication today. Denies drinking alcohol. Denies eating anything he shouldn't ear.  Pt uses cane at home but denies any trouble with gait. Patient states he feels good, he denies headache, chest pain, shortness of breath, or any symptoms.  PCP Dr Chris Simpson  Past Medical History  Diagnosis Date  . Hypertension   . CVA (cerebral infarction)   . Cardiac arrest     History reviewed. No pertinent past surgical history.  History reviewed. No pertinent family history.  History  Substance Use Topics  . Smoking status: Former Smoker -- 0.3 packs/day    Types: Cigarettes    Quit date: 11/26/2011  . Smokeless tobacco: Never Used  . Alcohol Use: No   Lives alone Uses a  cane   Review of Systems  All other systems reviewed and are negative.  10 Systems reviewed and all are negative for acute change except as noted in the HPI.    Allergies  Review of patient's allergies indicates no known allergies.  Home Medications   Current Outpatient Rx  Name Route Sig Dispense Refill  . ASPIRIN 325 MG PO TABS Oral Take 1 tablet (325 mg total) by mouth daily. 100 tablet 0  . ENSURE COMPLETE PO LIQD Oral Take 237 mLs by mouth 2 (two) times daily between meals. 60 Bottle 0  . LABETALOL HCL 200 MG PO TABS Oral Take 1 tablet (200 mg total) by mouth 2 (two) times daily. 60 tablet 0  . LEVETIRACETAM 500 MG PO TABS Oral Take 1 tablet (500 mg total) by mouth 2 (two) times daily. 60 tablet 0    BP 219/94  Pulse 56  Temp 97.9 F (36.6 C) (Oral)  Resp 18  Ht 5\' 8"  (1.727 m)  Wt 138 lb (62.596 kg)  BMI 20.98 kg/m2  SpO2 98%  Vital signs normal except hypertension, bradycardia   Physical Exam  Nursing note and vitals reviewed. Constitutional: He is oriented to person, place, and time. He appears well-developed and well-nourished.  Non-toxic appearance. He does not appear ill. No distress.  HENT:  Head: Normocephalic and atraumatic.  Right Ear: External ear normal.  Left Ear: External ear normal.  Nose: Nose normal.  No mucosal edema or rhinorrhea.  Mouth/Throat: Oropharynx is clear and moist and mucous membranes are normal. No dental abscesses or uvula swelling.       Poor dentition  Eyes: Conjunctivae normal and EOM are normal. Pupils are equal, round, and reactive to light.  Neck: Normal range of motion and full passive range of motion without pain. Neck supple.  Cardiovascular: Normal rate, regular rhythm and normal heart sounds.  Exam reveals no gallop and no friction rub.   No murmur heard. Pulmonary/Chest: Effort normal and breath sounds normal. No respiratory distress. He has no wheezes. He has no rhonchi. He has no rales. He exhibits no tenderness and  no crepitus.  Abdominal: Soft. Normal appearance and bowel sounds are normal. He exhibits no distension. There is no tenderness. There is no rebound and no guarding.       Very active bowel sounds  Musculoskeletal: Normal range of motion. He exhibits no edema and no tenderness.       Moves all extremities well.   Neurological: He is alert and oriented to person, place, and time. He has normal strength. No cranial nerve deficit. Coordination normal.  Skin: Skin is warm, dry and intact. No rash noted. No erythema. No pallor.  Psychiatric: He has a normal mood and affect. His speech is normal and behavior is normal. His mood appears not anxious.    ED Course  Procedures (including critical care time) DIAGNOSTIC STUDIES: Oxygen Saturation is 98% on room air, normal by my interpretation.    COORDINATION OF CARE: 3:20 PM Discussed ED treatment with pt  Medications  0.9 %  sodium chloride infusion (not administered)  niCARdipine (CARDENE-IV) infusion (0.1 mg/ml) (not administered)  cloNIDine (CATAPRES) tablet 0.1 mg (not administered)  labetalol (NORMODYNE) tablet 200 mg (200 mg Oral Given 02/20/12 1607)    5:40PM Recheck pt. Pt states he is feeling fine. His BP is 202/96. HR is 60.  Review of patient's discharge summary from August. He was admitted August tab 4 seizure followed by respiratory failure and he was on a vent. He had a stroke felt to be from uncontrolled hypertension. He was initially started on Cardene and then switched to labetalol despite being noted to have bradycardia on arrival prior to getting any medications. They document he had good control of his blood pressure while in the hospital on the labetalol 200 mg twice a day.  PT remains hypertensive after 1 hr of getting the labetolol po. Will start IV and get labs and start on cardene drip.  Discussed admission to control BP since that is what caused his stroke in August. Pt now relates he ate salt last night.   6:24PM  Consult Dr. Karie Simpson. Discussed pt with Dr. Karie Simpson. Dr. Karie Simpson recommends giving pt Clonidine and he will be in to see pt.   19:00 Dr Chris Simpson has decided to admit patient.   Results for orders placed during the hospital encounter of 02/20/12  URINALYSIS, ROUTINE W REFLEX MICROSCOPIC      Component Value Range   Color, Urine YELLOW  YELLOW   APPearance CLEAR  CLEAR   Specific Gravity, Urine 1.020  1.005 - 1.030   pH 6.5  5.0 - 8.0   Glucose, UA NEGATIVE  NEGATIVE mg/dL   Hgb urine dipstick NEGATIVE  NEGATIVE   Bilirubin Urine NEGATIVE  NEGATIVE   Ketones, ur NEGATIVE  NEGATIVE mg/dL   Protein, ur 30 (*) NEGATIVE mg/dL   Urobilinogen, UA 0.2  0.0 - 1.0 mg/dL  Nitrite NEGATIVE  NEGATIVE   Leukocytes, UA NEGATIVE  NEGATIVE  CBC WITH DIFFERENTIAL      Component Value Range   WBC 7.9  4.0 - 10.5 K/uL   RBC 4.66  4.22 - 5.81 MIL/uL   Hemoglobin 14.8  13.0 - 17.0 g/dL   HCT 44.9  39.0 - 52.0 %   MCV 96.4  78.0 - 100.0 fL   MCH 31.8  26.0 - 34.0 pg   MCHC 33.0  30.0 - 36.0 g/dL   RDW 15.0  11.5 - 15.5 %   Platelets 269  150 - 400 K/uL   Neutrophils Relative 62  43 - 77 %   Neutro Abs 4.9  1.7 - 7.7 K/uL   Lymphocytes Relative 26  12 - 46 %   Lymphs Abs 2.0  0.7 - 4.0 K/uL   Monocytes Relative 7  3 - 12 %   Monocytes Absolute 0.6  0.1 - 1.0 K/uL   Eosinophils Relative 5  0 - 5 %   Eosinophils Absolute 0.4  0.0 - 0.7 K/uL   Basophils Relative 0  0 - 1 %   Basophils Absolute 0.0  0.0 - 0.1 K/uL  URINE MICROSCOPIC-ADD ON      Component Value Range   Squamous Epithelial / LPF RARE  RARE   WBC, UA 0-2  <3 WBC/hpf   RBC / HPF 0-2  <3 RBC/hpf   Bacteria, UA RARE  RARE   Laboratory interpretation all normal except istat 8 never resulted, bmet ordered at 00:50.      Date: 02/20/2012  Rate: 56  Rhythm: sinus bradycardia  QRS Axis: normal  Intervals: normal  ST/T Wave abnormalities: normal  Conduction Disutrbances:none  Narrative Interpretation:   Old EKG Reviewed: changes  noted from 8/11/ 2013 HR was 116     1. Hypertensive emergency     Plan admission  MDM   I personally performed the services described in this documentation, which was scribed in my presence. The recorded information has been reviewed and considered.  Rolland Porter, MD, FACEP    Chris Norrie, MD 02/21/12 [phone]

## 2012-02-20 NOTE — ED Notes (Addendum)
Pharmacy notified that scheduled labetalol is not in ED Pyxis. Pharmacy stated they would bring it up.  MD aware. Pt aware and updated on care plan.

## 2012-02-20 NOTE — ED Notes (Signed)
Was evaluated by his PT today; states therapist took BP 182/102. Denies pain, denies dizziness, denies HA.

## 2012-02-20 NOTE — ED Notes (Signed)
Dr. Karie Kirks made aware of BP of 136/77.  States to stop Cardene drip and keep in ICU.

## 2012-02-20 NOTE — ED Notes (Signed)
Attempting IV, EDP in room. Given VO to give po meds and to hold off on IV accesS.

## 2012-02-21 DIAGNOSIS — I6789 Other cerebrovascular disease: Secondary | ICD-10-CM | POA: Diagnosis not present

## 2012-02-21 DIAGNOSIS — R569 Unspecified convulsions: Secondary | ICD-10-CM | POA: Diagnosis not present

## 2012-02-21 DIAGNOSIS — I10 Essential (primary) hypertension: Secondary | ICD-10-CM | POA: Diagnosis not present

## 2012-02-21 LAB — BASIC METABOLIC PANEL
CO2: 28 mEq/L (ref 19–32)
GFR calc non Af Amer: 49 mL/min — ABNORMAL LOW (ref >90)
Glucose, Bld: 85 mg/dL (ref 70–99)
Potassium: 4.2 mEq/L (ref 3.5–5.1)
Sodium: 140 mEq/L (ref 135–145)

## 2012-02-21 MED ORDER — ENOXAPARIN SODIUM 40 MG/0.4ML ~~LOC~~ SOLN
40.0000 mg | SUBCUTANEOUS | Status: DC
  Administered 2012-02-21: 40 mg via SUBCUTANEOUS
  Filled 2012-02-21: qty 0.4

## 2012-02-21 MED ORDER — AMLODIPINE BESYLATE 5 MG PO TABS
5.0000 mg | ORAL_TABLET | Freq: Every day | ORAL | Status: DC
  Administered 2012-02-21 – 2012-02-22 (×2): 5 mg via ORAL
  Filled 2012-02-21 (×2): qty 1

## 2012-02-21 NOTE — Progress Notes (Signed)
Subjective: Patient was admitted last night due hypertensive emergency. He is feeling better. No headache, chest pain, nausea or vomiting.  Objective: Vital signs in last 24 hours: Temp:  [97.7 F (36.5 C)-98 F (36.7 C)] 97.7 F (36.5 C) (09/26 0400) Pulse Rate:  [51-79] 59  (09/26 0600) Resp:  [14-23] 16  (09/26 0600) BP: (136-219)/(62-139) 160/71 mmHg (09/26 0600) SpO2:  [95 %-100 %] 96 % (09/26 0600) Weight:  [61 kg (134 lb 7.7 oz)-62.596 kg (138 lb)] 61 kg (134 lb 7.7 oz) (09/26 0500) Weight change:  Last BM Date: 02/20/12  Intake/Output from previous day: 09/25 0701 - 09/26 0700 In: 545 [I.V.:545] Out: 700 [Urine:700]  PHYSICAL EXAM General appearance: alert and no distress Resp: clear to auscultation bilaterally Cardio: S1, S2 normal GI: soft, non-tender; bowel sounds normal; no masses,  no organomegaly Extremities: extremities normal, atraumatic, no cyanosis or edema  Lab Results:    @labtest @ ABGS No results found for this basename: PHART,PCO2,PO2ART,TCO2,HCO3 in the last 72 hours CULTURES Recent Results (from the past 240 hour(s))  MRSA PCR SCREENING     Status: Normal   Collection Time   02/20/12  9:00 PM      Component Value Range Status Comment   MRSA by PCR NEGATIVE  NEGATIVE Final    Studies/Results: No results found.  Medications: I have reviewed the patient's current medications.  Assesment: 1. Hypertensive emergency - improved 2.H/O CVA 3. H/O seizure disorder  Active Problems:  * No active hospital problems. *     Plan: Medications reviewed We will adjust his medications Continue anticonvulsive medications.    LOS: 1 day   Chris Simpson 02/21/2012, 7:36 AM

## 2012-02-21 NOTE — Care Management Note (Signed)
    Page 1 of 1   02/22/2012     10:43:27 AM   CARE MANAGEMENT NOTE 02/22/2012  Patient:  JIGAR, LUPOLD   Account Number:  [id number]  Date Initiated:  02/21/2012  Documentation initiated by:  Theophilus Kinds  Subjective/Objective Assessment:   Pt admitted from home with HTN crisis. Pt lives alone and will return home at discharge. Pt stated that he has a sister, nephew, and ex wife that checks on him frequently. Pt is independent with ADL's. Pt does have a cane for prn use.     Action/Plan:   No CM or HH needs noted at this time. Will continue to follow.   Anticipated DC Date:  02/23/2012   Anticipated DC Plan:  [place]  CM consult      Choice offered to / List presented to:             Status of service:  Completed, signed off Medicare Important Message given?  NA - LOS <3 / Initial given by admissions (If response is "NO", the following Medicare IM given date fields will be blank) Date Medicare IM given:   Date Additional Medicare IM given:    Discharge Disposition:  HOME/SELF CARE  Per UR Regulation:    If discussed at Long Length of Stay Meetings, dates discussed:    Comments:  02/22/12 [place], RN BSN CM Pt discharged home today. No CM or HH needs noted.  02/21/12 [place], RN BSN CM

## 2012-02-21 NOTE — H&P (Signed)
NAMERIYAN, FARON NO.:  [id number]  MEDICAL RECORD NO.:  [id number]  LOCATION:  A201                          FACILITY:  [facility]  PHYSICIAN:  Estill Bamberg. Karie Kirks, M.D.DATE OF BIRTH:  08/04/1931  DATE OF ADMISSION:  02/20/2012 DATE OF DISCHARGE:  LH                             HISTORY & PHYSICAL   This 78 year old was found to have a blood pressure of 180/100 several times at home today so he was brought up to the [facility] emergency room, where his blood pressure was in the [id number] range systolic.  He was admitted to [facility] August 9th with a stroke that apparently was secondary to a hypertensive emergency.  He went to rehab and since then, according to his nephew, has progressed very nicely and has been at home with physical therapy checking him at home.  His blood pressure apparently was doing quite well yesterday and the days before but today was up.  The physical therapist notified the nephew, who then rechecked the blood pressure.  There were several elevated readings before he was brought to the emergency room.  Generally, he says his diet has been doing well but he did have some seasoned salt last night.  He lives by himself.  CURRENT MEDICATIONS: 1. Aspirin 325 mg daily. 2. Labetalol 200 mg b.i.d. 3. Keppra 500 mg b.i.d. for the seizures that started at the time of     his admission at [facility]. In addition to his diagnoses of diagnosis of hypertension and recent acute respiratory failure, he also apparently has chronic renal insufficiency and developed aspiration pneumonitis while in the hospital last month.  Review of the records showed a BUN of 24 with a creatinine of 1.82 August 14th.  The CBC was essentially normal at that time.  MRI of the head done last month showed several acute, predominantly small, nonhemorrhagic infarcts to the right hemisphere involving the right posterior frontal lobe, the right  parietal lobe, the right occipital lobe, and the right posterior temporal lobe, with a small acute infarct at the junction of the right thalamus and posterior limb of the right internal capsule.  He had also small remote infarcts as well.  He had moderate small-vessel disease changes and global atrophy. He had cervical spondylitic changes with spinal stenosis and mild cord flattening at C3-4 and C4-5.  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 The etiology of this was indeterminate.  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  Thus, there is mild narrowing of the right vertebral artery, and nonvisualization of the right AICA and only a small portion of the left AICA was noted.  He had moderate to marked narrowing of portions of the posterior cerebral arteries and broad-based bulge to the proximal cavernous section of the right internal carotid artery, which was felt to be related to atherosclerotic changes most likely.  His echocardiogram showed no cardiac source of emboli.  ADMISSION PHYSICAL EXAMINATION HERE:  GENERAL:  A pleasant, elderly man. His nephew was with him in the room.  His speech appeared to be normal. He was able to give me a history and he seemed to be alert. VITAL SIGNS:  Temperature is 97.9, pulse 72, respiratory rate 16, blood pressure 202/96 at the time my exam. HEART:  Regular rhythm, rate of about 80. LUNGS:  Clear throughout but decreased breath sounds. ABDOMEN:  Soft, without organomegaly, mass or tenderness. EXTREMITIES:  He had no edema of the ankles.  He had __________ received labetalol 200 mg 2 tablets by mouth and was being started on nicardipine infusion at 5 mg/hour.  ADMISSION DIAGNOSES: 1. Uncontrolled  hypertension. 2. Status post cerebrovascular accident secondary to a hypertensive     emergency. 3. Status post stroke-like symptoms with signs of acute hemorrhagic     strokes throughout the brain based on a hypertensive emergency. 4. Old lacunar infarcts in the thalami bilaterally and the right basal     ganglia. 5. Renal insufficiency. He will be admitted to the service of his primary care physician, Dr. Legrand Rams.  He will be treated in hospital with IV antihypertensives if necessary. I discussed all this with the patient and his nephew.     Estill Bamberg. Karie Kirks, M.D.     SDK/MEDQ  D:  02/20/2012  T:  02/21/2012  Job:  [id number]

## 2012-02-21 NOTE — Progress Notes (Signed)
Dr Legrand Rams paged. Order received to transfer pt out of ICU to tele.

## 2012-02-21 NOTE — Progress Notes (Signed)
Pt transferred to room 201 per MD order. Report given to RN. Pt transferred via wheelchair with personal belongings. Tele placed on pt. Pt sitting up in chair

## 2012-02-21 NOTE — Progress Notes (Signed)
Nutrition Education Note  RD consulted for nutrition education regarding a Heart Healthy diet.   Lipid Panel     Component Value Date/Time   CHOL 156 01/07/2012 0521   TRIG 104 01/07/2012 0521   HDL 45 01/07/2012 0521   CHOLHDL 3.5 01/07/2012 0521   VLDL 21 01/07/2012 0521   LDLCALC 90 01/07/2012 0521    RD provided "Heart Healthy Nutrition Therapy" handout from the Academy of Nutrition and Dietetics. Reviewed patient's dietary recall. Provided examples on ways to decrease sodium and fat intake in diet. Discouraged intake of processed foods and use of salt shaker. Encouraged fresh fruits and vegetables as well as whole grain sources of carbohydrates to maximize fiber intake.  Expect good compliance.  Body mass index is 20.45 kg/(m^2). Pt meets criteria for normal based on current BMI.  Current diet order is Heart Healthy, patient is consuming approximately 100% of meals at this time. Labs and medications reviewed. No further nutrition interventions warranted at this time. RD contact information provided. If additional nutrition issues arise, please re-consult RD.  Dietitian: [phone]

## 2012-02-22 DIAGNOSIS — I10 Essential (primary) hypertension: Secondary | ICD-10-CM | POA: Diagnosis not present

## 2012-02-22 DIAGNOSIS — R569 Unspecified convulsions: Secondary | ICD-10-CM | POA: Diagnosis not present

## 2012-02-22 DIAGNOSIS — I6789 Other cerebrovascular disease: Secondary | ICD-10-CM | POA: Diagnosis not present

## 2012-02-22 MED ORDER — AMLODIPINE BESYLATE 5 MG PO TABS: 5.0000 mg | ORAL_TABLET | Freq: Every day | ORAL | Status: DC

## 2012-02-22 NOTE — Discharge Summary (Signed)
Physician Discharge Summary  Patient ID: Chris Simpson MRN: [id number] DOB/AGE: 08-23-1931 33 y.o. Primary Care Physician:Rielly Corlett, MD Admit date: 02/20/2012 Discharge date: 02/22/2012    Discharge Diagnoses:  1.Uncontrolled hypertension.  2. Status post cerebrovascular accident secondary to a hypertensive  emergency.  3. Status post stroke-like symptoms with signs of acute hemorrhagic  strokes throughout the brain based on a hypertensive emergency.  4. Old lacunar infarcts in the thalami bilaterally and the right basal  ganglia.  5. Renal insufficiency  Active Problems:  * No active hospital problems. *      Medication List     As of 02/22/2012  7:41 AM    TAKE these medications         amLODipine 5 MG tablet   Commonly known as: NORVASC   Take 1 tablet (5 mg total) by mouth daily.      aspirin 325 MG tablet   Take 325 mg by mouth every morning.      labetalol 200 MG tablet   Commonly known as: NORMODYNE   Take 1 tablet (200 mg total) by mouth 2 (two) times daily.      levETIRAcetam 500 MG tablet   Commonly known as: KEPPRA   Take 500 mg by mouth 2 (two) times daily. For seizures        Discharged Condition:*stable   Consults:*none Significant Diagnostic Studies: No results found.  Lab Results: Basic Metabolic Panel:  Basename 02/21/12 0051  NA 140  K 4.2  CL 103  CO2 28  GLUCOSE 85  BUN 19  CREATININE 1.35  CALCIUM 10.2  MG --  PHOS --   Liver Function Tests: No results found for this basename: AST:2,ALT:2,ALKPHOS:2,BILITOT:2,PROT:2,ALBUMIN:2 in the last 72 hours   CBC:  Basename 02/20/12 1943  WBC 7.9  NEUTROABS 4.9  HGB 14.8  HCT 44.9  MCV 96.4  PLT 269    Recent Results (from the past 240 hour(s))  MRSA PCR SCREENING     Status: Normal   Collection Time   02/20/12  9:00 PM      Component Value Range Status Comment   MRSA by PCR NEGATIVE  NEGATIVE Final      Hospital Course: * This is a 78 years old male patient with  history of multiple medical illness was admitted due to hypertensive emergency. Patient was admitted to ICU. His B/P was controlled with IV antihypertensives. He is B/P is currently controlled Norvasc is added to his B/P medication. Patient will be discharge home with oral medication and will be followed in out patient. Discharge Exam: Blood pressure 132/81, pulse 62, temperature 98.2 F (36.8 C), temperature source Oral, resp. rate 18, height 5\' 8"  (1.727 m), weight 61 kg (134 lb 7.7 oz), SpO2 95.00%. *  Disposition: *home       Follow-up Information    Follow up with [name], MD. In 1 week.   Contact information:   [place] [address] [phone]          Signed: Rosita Fire   02/22/2012, 7:41 AM

## 2012-02-22 NOTE — Progress Notes (Signed)
Pt discharged home today per Dr. Legrand Rams. Pt's IV site D/C'd and WNL. Pt's VS stable at this time. Pt provided with home medication list and discharge instructions. Pt made aware of which pharmacy he could obtain his prescriptions called in by Dr. Legrand Rams. Pt verbalized understanding of all of the above mentioned. Pt left floor via WC in stable condition accompanied by NT.

## 2012-02-22 NOTE — Progress Notes (Signed)
Discharge instruction given to patient and POA Patient and POA verbalized understanding. Patient's IV removed. Site WNL. Patient alert, oriented and in stable condition at the time of discharge. Patient being discharged home with POA.

## 2012-02-28 DIAGNOSIS — I6789 Other cerebrovascular disease: Secondary | ICD-10-CM | POA: Diagnosis not present

## 2012-02-28 DIAGNOSIS — R569 Unspecified convulsions: Secondary | ICD-10-CM | POA: Diagnosis not present

## 2012-02-28 DIAGNOSIS — I10 Essential (primary) hypertension: Secondary | ICD-10-CM | POA: Diagnosis not present

## 2012-02-29 DIAGNOSIS — I69998 Other sequelae following unspecified cerebrovascular disease: Secondary | ICD-10-CM | POA: Diagnosis not present

## 2012-02-29 DIAGNOSIS — R569 Unspecified convulsions: Secondary | ICD-10-CM | POA: Diagnosis not present

## 2012-02-29 DIAGNOSIS — H548 Legal blindness, as defined in USA: Secondary | ICD-10-CM | POA: Diagnosis not present

## 2012-02-29 DIAGNOSIS — R262 Difficulty in walking, not elsewhere classified: Secondary | ICD-10-CM | POA: Diagnosis not present

## 2012-02-29 DIAGNOSIS — I129 Hypertensive chronic kidney disease with stage 1 through stage 4 chronic kidney disease, or unspecified chronic kidney disease: Secondary | ICD-10-CM | POA: Diagnosis not present

## 2012-02-29 DIAGNOSIS — IMO0001 Reserved for inherently not codable concepts without codable children: Secondary | ICD-10-CM | POA: Diagnosis not present

## 2012-03-05 DIAGNOSIS — IMO0001 Reserved for inherently not codable concepts without codable children: Secondary | ICD-10-CM | POA: Diagnosis not present

## 2012-03-05 DIAGNOSIS — R569 Unspecified convulsions: Secondary | ICD-10-CM | POA: Diagnosis not present

## 2012-03-05 DIAGNOSIS — I69998 Other sequelae following unspecified cerebrovascular disease: Secondary | ICD-10-CM | POA: Diagnosis not present

## 2012-03-05 DIAGNOSIS — H548 Legal blindness, as defined in USA: Secondary | ICD-10-CM | POA: Diagnosis not present

## 2012-03-05 DIAGNOSIS — I129 Hypertensive chronic kidney disease with stage 1 through stage 4 chronic kidney disease, or unspecified chronic kidney disease: Secondary | ICD-10-CM | POA: Diagnosis not present

## 2012-03-05 DIAGNOSIS — R262 Difficulty in walking, not elsewhere classified: Secondary | ICD-10-CM | POA: Diagnosis not present

## 2012-03-07 DIAGNOSIS — R569 Unspecified convulsions: Secondary | ICD-10-CM | POA: Diagnosis not present

## 2012-03-07 DIAGNOSIS — R262 Difficulty in walking, not elsewhere classified: Secondary | ICD-10-CM | POA: Diagnosis not present

## 2012-03-07 DIAGNOSIS — I69998 Other sequelae following unspecified cerebrovascular disease: Secondary | ICD-10-CM | POA: Diagnosis not present

## 2012-03-07 DIAGNOSIS — H548 Legal blindness, as defined in USA: Secondary | ICD-10-CM | POA: Diagnosis not present

## 2012-03-07 DIAGNOSIS — IMO0001 Reserved for inherently not codable concepts without codable children: Secondary | ICD-10-CM | POA: Diagnosis not present

## 2012-03-07 DIAGNOSIS — I129 Hypertensive chronic kidney disease with stage 1 through stage 4 chronic kidney disease, or unspecified chronic kidney disease: Secondary | ICD-10-CM | POA: Diagnosis not present

## 2012-03-12 DIAGNOSIS — I129 Hypertensive chronic kidney disease with stage 1 through stage 4 chronic kidney disease, or unspecified chronic kidney disease: Secondary | ICD-10-CM | POA: Diagnosis not present

## 2012-03-12 DIAGNOSIS — R569 Unspecified convulsions: Secondary | ICD-10-CM | POA: Diagnosis not present

## 2012-03-12 DIAGNOSIS — IMO0001 Reserved for inherently not codable concepts without codable children: Secondary | ICD-10-CM | POA: Diagnosis not present

## 2012-03-12 DIAGNOSIS — I69998 Other sequelae following unspecified cerebrovascular disease: Secondary | ICD-10-CM | POA: Diagnosis not present

## 2012-03-12 DIAGNOSIS — H548 Legal blindness, as defined in USA: Secondary | ICD-10-CM | POA: Diagnosis not present

## 2012-03-12 DIAGNOSIS — R262 Difficulty in walking, not elsewhere classified: Secondary | ICD-10-CM | POA: Diagnosis not present

## 2012-03-19 DIAGNOSIS — R262 Difficulty in walking, not elsewhere classified: Secondary | ICD-10-CM | POA: Diagnosis not present

## 2012-03-19 DIAGNOSIS — I129 Hypertensive chronic kidney disease with stage 1 through stage 4 chronic kidney disease, or unspecified chronic kidney disease: Secondary | ICD-10-CM | POA: Diagnosis not present

## 2012-03-19 DIAGNOSIS — IMO0001 Reserved for inherently not codable concepts without codable children: Secondary | ICD-10-CM | POA: Diagnosis not present

## 2012-03-19 DIAGNOSIS — I69998 Other sequelae following unspecified cerebrovascular disease: Secondary | ICD-10-CM | POA: Diagnosis not present

## 2012-03-19 DIAGNOSIS — R569 Unspecified convulsions: Secondary | ICD-10-CM | POA: Diagnosis not present

## 2012-03-19 DIAGNOSIS — H548 Legal blindness, as defined in USA: Secondary | ICD-10-CM | POA: Diagnosis not present

## 2012-03-25 DIAGNOSIS — R262 Difficulty in walking, not elsewhere classified: Secondary | ICD-10-CM | POA: Diagnosis not present

## 2012-03-25 DIAGNOSIS — I129 Hypertensive chronic kidney disease with stage 1 through stage 4 chronic kidney disease, or unspecified chronic kidney disease: Secondary | ICD-10-CM | POA: Diagnosis not present

## 2012-03-25 DIAGNOSIS — R569 Unspecified convulsions: Secondary | ICD-10-CM | POA: Diagnosis not present

## 2012-03-25 DIAGNOSIS — IMO0001 Reserved for inherently not codable concepts without codable children: Secondary | ICD-10-CM | POA: Diagnosis not present

## 2012-03-25 DIAGNOSIS — I69998 Other sequelae following unspecified cerebrovascular disease: Secondary | ICD-10-CM | POA: Diagnosis not present

## 2012-03-25 DIAGNOSIS — H548 Legal blindness, as defined in USA: Secondary | ICD-10-CM | POA: Diagnosis not present

## 2012-04-17 DIAGNOSIS — I6350 Cerebral infarction due to unspecified occlusion or stenosis of unspecified cerebral artery: Secondary | ICD-10-CM | POA: Diagnosis not present

## 2012-04-30 DIAGNOSIS — G459 Transient cerebral ischemic attack, unspecified: Secondary | ICD-10-CM | POA: Diagnosis not present

## 2012-05-23 ENCOUNTER — Other Ambulatory Visit: Payer: Self-pay | Admitting: *Deleted

## 2012-05-23 ENCOUNTER — Other Ambulatory Visit: Payer: Self-pay | Admitting: Neurology

## 2012-05-23 DIAGNOSIS — I639 Cerebral infarction, unspecified: Secondary | ICD-10-CM

## 2012-05-30 DIAGNOSIS — R569 Unspecified convulsions: Secondary | ICD-10-CM | POA: Diagnosis not present

## 2012-05-30 DIAGNOSIS — I6789 Other cerebrovascular disease: Secondary | ICD-10-CM | POA: Diagnosis not present

## 2012-05-30 DIAGNOSIS — I10 Essential (primary) hypertension: Secondary | ICD-10-CM | POA: Diagnosis not present

## 2012-07-16 DIAGNOSIS — R569 Unspecified convulsions: Secondary | ICD-10-CM | POA: Diagnosis not present

## 2012-07-16 DIAGNOSIS — I6350 Cerebral infarction due to unspecified occlusion or stenosis of unspecified cerebral artery: Secondary | ICD-10-CM | POA: Diagnosis not present

## 2012-08-07 ENCOUNTER — Other Ambulatory Visit: Payer: Self-pay | Admitting: Neurology

## 2012-08-29 DIAGNOSIS — I10 Essential (primary) hypertension: Secondary | ICD-10-CM | POA: Diagnosis not present

## 2012-08-29 DIAGNOSIS — R569 Unspecified convulsions: Secondary | ICD-10-CM | POA: Diagnosis not present

## 2012-08-29 DIAGNOSIS — I6789 Other cerebrovascular disease: Secondary | ICD-10-CM | POA: Diagnosis not present

## 2012-12-16 DIAGNOSIS — R5381 Other malaise: Secondary | ICD-10-CM | POA: Diagnosis not present

## 2012-12-16 DIAGNOSIS — I10 Essential (primary) hypertension: Secondary | ICD-10-CM | POA: Diagnosis not present

## 2012-12-16 DIAGNOSIS — I6789 Other cerebrovascular disease: Secondary | ICD-10-CM | POA: Diagnosis not present

## 2012-12-16 DIAGNOSIS — R634 Abnormal weight loss: Secondary | ICD-10-CM | POA: Diagnosis not present

## 2012-12-16 DIAGNOSIS — R569 Unspecified convulsions: Secondary | ICD-10-CM | POA: Diagnosis not present

## 2013-01-14 ENCOUNTER — Encounter: Payer: Self-pay | Admitting: Neurology

## 2013-01-15 ENCOUNTER — Telehealth: Payer: Self-pay | Admitting: Neurology

## 2013-01-19 ENCOUNTER — Ambulatory Visit: Payer: Self-pay | Admitting: Neurology

## 2013-03-17 DIAGNOSIS — Z23 Encounter for immunization: Secondary | ICD-10-CM | POA: Diagnosis not present

## 2013-03-17 DIAGNOSIS — E7800 Pure hypercholesterolemia, unspecified: Secondary | ICD-10-CM | POA: Diagnosis not present

## 2013-03-17 DIAGNOSIS — I10 Essential (primary) hypertension: Secondary | ICD-10-CM | POA: Diagnosis not present

## 2013-06-30 DIAGNOSIS — I6789 Other cerebrovascular disease: Secondary | ICD-10-CM | POA: Diagnosis not present

## 2013-06-30 DIAGNOSIS — I10 Essential (primary) hypertension: Secondary | ICD-10-CM | POA: Diagnosis not present

## 2013-06-30 DIAGNOSIS — R569 Unspecified convulsions: Secondary | ICD-10-CM | POA: Diagnosis not present

## 2013-09-15 DIAGNOSIS — R5381 Other malaise: Secondary | ICD-10-CM | POA: Diagnosis not present

## 2013-09-15 DIAGNOSIS — I10 Essential (primary) hypertension: Secondary | ICD-10-CM | POA: Diagnosis not present

## 2013-09-15 DIAGNOSIS — R5383 Other fatigue: Secondary | ICD-10-CM | POA: Diagnosis not present

## 2013-09-15 DIAGNOSIS — R569 Unspecified convulsions: Secondary | ICD-10-CM | POA: Diagnosis not present

## 2013-09-15 DIAGNOSIS — R634 Abnormal weight loss: Secondary | ICD-10-CM | POA: Diagnosis not present

## 2013-09-15 DIAGNOSIS — I6789 Other cerebrovascular disease: Secondary | ICD-10-CM | POA: Diagnosis not present

## 2013-09-16 DIAGNOSIS — I6350 Cerebral infarction due to unspecified occlusion or stenosis of unspecified cerebral artery: Secondary | ICD-10-CM | POA: Diagnosis not present

## 2013-09-16 DIAGNOSIS — R799 Abnormal finding of blood chemistry, unspecified: Secondary | ICD-10-CM | POA: Diagnosis not present

## 2013-09-16 DIAGNOSIS — R7301 Impaired fasting glucose: Secondary | ICD-10-CM | POA: Diagnosis not present

## 2013-09-16 DIAGNOSIS — I10 Essential (primary) hypertension: Secondary | ICD-10-CM | POA: Diagnosis not present

## 2013-09-16 DIAGNOSIS — R569 Unspecified convulsions: Secondary | ICD-10-CM | POA: Diagnosis not present

## 2013-10-01 NOTE — Telephone Encounter (Signed)
Closing encounter

## 2013-12-15 DIAGNOSIS — Z23 Encounter for immunization: Secondary | ICD-10-CM | POA: Diagnosis not present

## 2013-12-15 DIAGNOSIS — R569 Unspecified convulsions: Secondary | ICD-10-CM | POA: Diagnosis not present

## 2013-12-15 DIAGNOSIS — I10 Essential (primary) hypertension: Secondary | ICD-10-CM | POA: Diagnosis not present

## 2013-12-15 DIAGNOSIS — R7309 Other abnormal glucose: Secondary | ICD-10-CM | POA: Diagnosis not present

## 2013-12-15 DIAGNOSIS — I69949 Monoplegia of lower limb following unspecified cerebrovascular disease affecting unspecified side: Secondary | ICD-10-CM | POA: Diagnosis not present

## 2014-04-13 DIAGNOSIS — R569 Unspecified convulsions: Secondary | ICD-10-CM | POA: Diagnosis not present

## 2014-04-13 DIAGNOSIS — R7302 Impaired glucose tolerance (oral): Secondary | ICD-10-CM | POA: Diagnosis not present

## 2014-04-13 DIAGNOSIS — I6350 Cerebral infarction due to unspecified occlusion or stenosis of unspecified cerebral artery: Secondary | ICD-10-CM | POA: Diagnosis not present

## 2014-04-13 DIAGNOSIS — Z23 Encounter for immunization: Secondary | ICD-10-CM | POA: Diagnosis not present

## 2014-04-13 DIAGNOSIS — I10 Essential (primary) hypertension: Secondary | ICD-10-CM | POA: Diagnosis not present

## 2014-06-04 DIAGNOSIS — R7309 Other abnormal glucose: Secondary | ICD-10-CM | POA: Diagnosis not present

## 2014-06-04 DIAGNOSIS — Z0000 Encounter for general adult medical examination without abnormal findings: Secondary | ICD-10-CM | POA: Diagnosis not present

## 2014-09-06 DIAGNOSIS — D731 Hypersplenism: Secondary | ICD-10-CM | POA: Diagnosis not present

## 2014-09-06 DIAGNOSIS — I10 Essential (primary) hypertension: Secondary | ICD-10-CM | POA: Diagnosis not present

## 2014-09-06 DIAGNOSIS — I6350 Cerebral infarction due to unspecified occlusion or stenosis of unspecified cerebral artery: Secondary | ICD-10-CM | POA: Diagnosis not present

## 2014-10-25 DIAGNOSIS — I10 Essential (primary) hypertension: Secondary | ICD-10-CM | POA: Diagnosis not present

## 2014-10-25 DIAGNOSIS — D731 Hypersplenism: Secondary | ICD-10-CM | POA: Diagnosis not present

## 2014-10-25 DIAGNOSIS — I69342 Monoplegia of lower limb following cerebral infarction affecting left dominant side: Secondary | ICD-10-CM | POA: Diagnosis not present

## 2014-12-01 DIAGNOSIS — F17200 Nicotine dependence, unspecified, uncomplicated: Secondary | ICD-10-CM | POA: Diagnosis not present

## 2014-12-01 DIAGNOSIS — R569 Unspecified convulsions: Secondary | ICD-10-CM | POA: Diagnosis not present

## 2014-12-01 DIAGNOSIS — G8190 Hemiplegia, unspecified affecting unspecified side: Secondary | ICD-10-CM | POA: Diagnosis not present

## 2014-12-01 DIAGNOSIS — I10 Essential (primary) hypertension: Secondary | ICD-10-CM | POA: Diagnosis not present

## 2015-01-26 DIAGNOSIS — H4011X3 Primary open-angle glaucoma, severe stage: Secondary | ICD-10-CM | POA: Diagnosis not present

## 2015-03-21 DIAGNOSIS — H5203 Hypermetropia, bilateral: Secondary | ICD-10-CM | POA: Diagnosis not present

## 2015-03-21 DIAGNOSIS — H401123 Primary open-angle glaucoma, left eye, severe stage: Secondary | ICD-10-CM | POA: Diagnosis not present

## 2015-03-21 DIAGNOSIS — H25813 Combined forms of age-related cataract, bilateral: Secondary | ICD-10-CM | POA: Diagnosis not present

## 2015-03-21 DIAGNOSIS — H52223 Regular astigmatism, bilateral: Secondary | ICD-10-CM | POA: Diagnosis not present

## 2015-04-19 DIAGNOSIS — I10 Essential (primary) hypertension: Secondary | ICD-10-CM | POA: Diagnosis not present

## 2015-04-19 DIAGNOSIS — D730 Hyposplenism: Secondary | ICD-10-CM | POA: Diagnosis not present

## 2015-04-19 DIAGNOSIS — I6350 Cerebral infarction due to unspecified occlusion or stenosis of unspecified cerebral artery: Secondary | ICD-10-CM | POA: Diagnosis not present

## 2015-05-19 DIAGNOSIS — I10 Essential (primary) hypertension: Secondary | ICD-10-CM | POA: Diagnosis not present

## 2015-05-19 DIAGNOSIS — G8190 Hemiplegia, unspecified affecting unspecified side: Secondary | ICD-10-CM | POA: Diagnosis not present

## 2015-06-01 DIAGNOSIS — R7302 Impaired glucose tolerance (oral): Secondary | ICD-10-CM | POA: Diagnosis not present

## 2015-06-01 DIAGNOSIS — G8190 Hemiplegia, unspecified affecting unspecified side: Secondary | ICD-10-CM | POA: Diagnosis not present

## 2015-06-01 DIAGNOSIS — I10 Essential (primary) hypertension: Secondary | ICD-10-CM | POA: Diagnosis not present

## 2015-06-01 DIAGNOSIS — R569 Unspecified convulsions: Secondary | ICD-10-CM | POA: Diagnosis not present

## 2015-06-01 DIAGNOSIS — F17200 Nicotine dependence, unspecified, uncomplicated: Secondary | ICD-10-CM | POA: Diagnosis not present

## 2015-07-02 DIAGNOSIS — I10 Essential (primary) hypertension: Secondary | ICD-10-CM | POA: Diagnosis not present

## 2015-07-02 DIAGNOSIS — I6350 Cerebral infarction due to unspecified occlusion or stenosis of unspecified cerebral artery: Secondary | ICD-10-CM | POA: Diagnosis not present

## 2015-07-30 DIAGNOSIS — F17200 Nicotine dependence, unspecified, uncomplicated: Secondary | ICD-10-CM | POA: Diagnosis not present

## 2015-07-30 DIAGNOSIS — I10 Essential (primary) hypertension: Secondary | ICD-10-CM | POA: Diagnosis not present

## 2015-08-30 DIAGNOSIS — I6350 Cerebral infarction due to unspecified occlusion or stenosis of unspecified cerebral artery: Secondary | ICD-10-CM | POA: Diagnosis not present

## 2015-08-30 DIAGNOSIS — I10 Essential (primary) hypertension: Secondary | ICD-10-CM | POA: Diagnosis not present

## 2015-09-29 DIAGNOSIS — I10 Essential (primary) hypertension: Secondary | ICD-10-CM | POA: Diagnosis not present

## 2015-09-29 DIAGNOSIS — I6350 Cerebral infarction due to unspecified occlusion or stenosis of unspecified cerebral artery: Secondary | ICD-10-CM | POA: Diagnosis not present

## 2015-10-30 DIAGNOSIS — I6350 Cerebral infarction due to unspecified occlusion or stenosis of unspecified cerebral artery: Secondary | ICD-10-CM | POA: Diagnosis not present

## 2015-10-30 DIAGNOSIS — I10 Essential (primary) hypertension: Secondary | ICD-10-CM | POA: Diagnosis not present

## 2015-11-25 DIAGNOSIS — R7302 Impaired glucose tolerance (oral): Secondary | ICD-10-CM | POA: Diagnosis not present

## 2015-11-25 DIAGNOSIS — G8190 Hemiplegia, unspecified affecting unspecified side: Secondary | ICD-10-CM | POA: Diagnosis not present

## 2015-11-25 DIAGNOSIS — D731 Hypersplenism: Secondary | ICD-10-CM | POA: Diagnosis not present

## 2015-11-25 DIAGNOSIS — F17200 Nicotine dependence, unspecified, uncomplicated: Secondary | ICD-10-CM | POA: Diagnosis not present

## 2015-11-25 DIAGNOSIS — I10 Essential (primary) hypertension: Secondary | ICD-10-CM | POA: Diagnosis not present

## 2015-11-30 ENCOUNTER — Other Ambulatory Visit (HOSPITAL_COMMUNITY): Payer: Self-pay | Admitting: Internal Medicine

## 2015-11-30 DIAGNOSIS — I10 Essential (primary) hypertension: Secondary | ICD-10-CM | POA: Diagnosis not present

## 2015-11-30 DIAGNOSIS — G8190 Hemiplegia, unspecified affecting unspecified side: Secondary | ICD-10-CM | POA: Diagnosis not present

## 2015-11-30 DIAGNOSIS — F17200 Nicotine dependence, unspecified, uncomplicated: Secondary | ICD-10-CM | POA: Diagnosis not present

## 2015-11-30 DIAGNOSIS — N184 Chronic kidney disease, stage 4 (severe): Secondary | ICD-10-CM | POA: Diagnosis not present

## 2015-11-30 DIAGNOSIS — N189 Chronic kidney disease, unspecified: Secondary | ICD-10-CM

## 2015-12-02 DIAGNOSIS — R809 Proteinuria, unspecified: Secondary | ICD-10-CM | POA: Diagnosis not present

## 2015-12-05 ENCOUNTER — Ambulatory Visit (HOSPITAL_COMMUNITY)
Admission: RE | Admit: 2015-12-05 | Discharge: 2015-12-05 | Disposition: A | Discharge status: Home / Self Care | Payer: Medicare Other | Source: Ambulatory Visit | Attending: Internal Medicine | Admitting: Internal Medicine

## 2015-12-05 DIAGNOSIS — N2889 Other specified disorders of kidney and ureter: Secondary | ICD-10-CM | POA: Diagnosis not present

## 2015-12-05 DIAGNOSIS — N189 Chronic kidney disease, unspecified: Secondary | ICD-10-CM | POA: Insufficient documentation

## 2015-12-31 DIAGNOSIS — I6350 Cerebral infarction due to unspecified occlusion or stenosis of unspecified cerebral artery: Secondary | ICD-10-CM | POA: Diagnosis not present

## 2015-12-31 DIAGNOSIS — I10 Essential (primary) hypertension: Secondary | ICD-10-CM | POA: Diagnosis not present

## 2016-01-23 DIAGNOSIS — E871 Hypo-osmolality and hyponatremia: Secondary | ICD-10-CM | POA: Diagnosis not present

## 2016-01-23 DIAGNOSIS — I10 Essential (primary) hypertension: Secondary | ICD-10-CM | POA: Diagnosis not present

## 2016-01-23 DIAGNOSIS — R809 Proteinuria, unspecified: Secondary | ICD-10-CM | POA: Diagnosis not present

## 2016-01-23 DIAGNOSIS — N189 Chronic kidney disease, unspecified: Secondary | ICD-10-CM | POA: Diagnosis not present

## 2016-01-31 DIAGNOSIS — I6350 Cerebral infarction due to unspecified occlusion or stenosis of unspecified cerebral artery: Secondary | ICD-10-CM | POA: Diagnosis not present

## 2016-01-31 DIAGNOSIS — I10 Essential (primary) hypertension: Secondary | ICD-10-CM | POA: Diagnosis not present

## 2016-02-10 ENCOUNTER — Other Ambulatory Visit (HOSPITAL_COMMUNITY): Payer: Self-pay | Admitting: Nephrology

## 2016-02-10 ENCOUNTER — Ambulatory Visit (HOSPITAL_COMMUNITY)
Admission: RE | Admit: 2016-02-10 | Discharge: 2016-02-10 | Disposition: A | Discharge status: Home / Self Care | Payer: Medicare Other | Source: Ambulatory Visit | Attending: Vascular Surgery | Admitting: Vascular Surgery

## 2016-02-10 DIAGNOSIS — N183 Chronic kidney disease, stage 3 unspecified: Secondary | ICD-10-CM

## 2016-02-10 DIAGNOSIS — I129 Hypertensive chronic kidney disease with stage 1 through stage 4 chronic kidney disease, or unspecified chronic kidney disease: Secondary | ICD-10-CM | POA: Insufficient documentation

## 2016-02-10 DIAGNOSIS — I701 Atherosclerosis of renal artery: Secondary | ICD-10-CM | POA: Diagnosis not present

## 2016-03-01 DIAGNOSIS — G8190 Hemiplegia, unspecified affecting unspecified side: Secondary | ICD-10-CM | POA: Diagnosis not present

## 2016-03-01 DIAGNOSIS — I10 Essential (primary) hypertension: Secondary | ICD-10-CM | POA: Diagnosis not present

## 2016-04-02 DIAGNOSIS — G8190 Hemiplegia, unspecified affecting unspecified side: Secondary | ICD-10-CM | POA: Diagnosis not present

## 2016-04-02 DIAGNOSIS — I10 Essential (primary) hypertension: Secondary | ICD-10-CM | POA: Diagnosis not present

## 2016-04-22 ENCOUNTER — Inpatient Hospital Stay (HOSPITAL_COMMUNITY)
Admission: EM | Admit: 2016-04-22 | Discharge: 2016-04-26 | DRG: 190 | Disposition: A | Discharge status: Home / Self Care | Payer: Medicare Other | Attending: Internal Medicine | Admitting: Internal Medicine

## 2016-04-22 ENCOUNTER — Emergency Department (HOSPITAL_COMMUNITY): Payer: Medicare Other

## 2016-04-22 ENCOUNTER — Encounter (HOSPITAL_COMMUNITY): Payer: Self-pay | Admitting: *Deleted

## 2016-04-22 DIAGNOSIS — Z8673 Personal history of transient ischemic attack (TIA), and cerebral infarction without residual deficits: Secondary | ICD-10-CM | POA: Diagnosis not present

## 2016-04-22 DIAGNOSIS — R0602 Shortness of breath: Secondary | ICD-10-CM | POA: Diagnosis not present

## 2016-04-22 DIAGNOSIS — J9600 Acute respiratory failure, unspecified whether with hypoxia or hypercapnia: Secondary | ICD-10-CM | POA: Diagnosis present

## 2016-04-22 DIAGNOSIS — J441 Chronic obstructive pulmonary disease with (acute) exacerbation: Secondary | ICD-10-CM | POA: Diagnosis not present

## 2016-04-22 DIAGNOSIS — Z8674 Personal history of sudden cardiac arrest: Secondary | ICD-10-CM | POA: Diagnosis not present

## 2016-04-22 DIAGNOSIS — N179 Acute kidney failure, unspecified: Secondary | ICD-10-CM | POA: Diagnosis present

## 2016-04-22 DIAGNOSIS — R569 Unspecified convulsions: Secondary | ICD-10-CM | POA: Diagnosis present

## 2016-04-22 DIAGNOSIS — J449 Chronic obstructive pulmonary disease, unspecified: Secondary | ICD-10-CM | POA: Insufficient documentation

## 2016-04-22 DIAGNOSIS — N189 Chronic kidney disease, unspecified: Secondary | ICD-10-CM | POA: Diagnosis present

## 2016-04-22 DIAGNOSIS — I129 Hypertensive chronic kidney disease with stage 1 through stage 4 chronic kidney disease, or unspecified chronic kidney disease: Secondary | ICD-10-CM | POA: Diagnosis present

## 2016-04-22 DIAGNOSIS — Z87891 Personal history of nicotine dependence: Secondary | ICD-10-CM | POA: Diagnosis not present

## 2016-04-22 DIAGNOSIS — F17200 Nicotine dependence, unspecified, uncomplicated: Secondary | ICD-10-CM | POA: Diagnosis present

## 2016-04-22 DIAGNOSIS — Z7982 Long term (current) use of aspirin: Secondary | ICD-10-CM

## 2016-04-22 DIAGNOSIS — Z79899 Other long term (current) drug therapy: Secondary | ICD-10-CM | POA: Diagnosis not present

## 2016-04-22 DIAGNOSIS — I10 Essential (primary) hypertension: Secondary | ICD-10-CM | POA: Diagnosis not present

## 2016-04-22 DIAGNOSIS — N178 Other acute kidney failure: Secondary | ICD-10-CM | POA: Diagnosis not present

## 2016-04-22 DIAGNOSIS — R069 Unspecified abnormalities of breathing: Secondary | ICD-10-CM | POA: Diagnosis not present

## 2016-04-22 LAB — CBC WITH DIFFERENTIAL/PLATELET
BASOS ABS: 0 10*3/uL (ref 0.0–0.1)
Basophils Relative: 0 %
Eosinophils Absolute: 0.2 10*3/uL (ref 0.0–0.7)
Eosinophils Relative: 1 %
HEMATOCRIT: 42.8 % (ref 39.0–52.0)
Hemoglobin: 14 g/dL (ref 13.0–17.0)
LYMPHS ABS: 1.2 10*3/uL (ref 0.7–4.0)
LYMPHS PCT: 8 %
MCH: 32.6 pg (ref 26.0–34.0)
MCHC: 32.7 g/dL (ref 30.0–36.0)
MCV: 99.5 fL (ref 78.0–100.0)
MONO ABS: 1 10*3/uL (ref 0.1–1.0)
MONOS PCT: 7 %
NEUTROS ABS: 12 10*3/uL — AB (ref 1.7–7.7)
Neutrophils Relative %: 84 %
Platelets: 264 10*3/uL (ref 150–400)
RBC: 4.3 MIL/uL (ref 4.22–5.81)
RDW: 15.5 % (ref 11.5–15.5)
WBC: 14.4 10*3/uL — ABNORMAL HIGH (ref 4.0–10.5)

## 2016-04-22 LAB — TROPONIN I: Troponin I: <0.03 ng/mL (ref <0.03)

## 2016-04-22 LAB — BASIC METABOLIC PANEL
Anion gap: 9 (ref 5–15)
BUN: 33 mg/dL — ABNORMAL HIGH (ref 6–20)
CO2: 19 mmol/L — ABNORMAL LOW (ref 22–32)
Calcium: 9.2 mg/dL (ref 8.9–10.3)
Chloride: 109 mmol/L (ref 101–111)
Creatinine, Ser: 1.96 mg/dL — ABNORMAL HIGH (ref 0.61–1.24)
GFR calc Af Amer: 35 mL/min — ABNORMAL LOW (ref >60)
GFR calc non Af Amer: 30 mL/min — ABNORMAL LOW (ref >60)
Glucose, Bld: 142 mg/dL — ABNORMAL HIGH (ref 65–99)
Potassium: 4.7 mmol/L (ref 3.5–5.1)
Sodium: 137 mmol/L (ref 135–145)

## 2016-04-22 LAB — BRAIN NATRIURETIC PEPTIDE: B Natriuretic Peptide: 43 pg/mL (ref 0.0–100.0)

## 2016-04-22 MED ORDER — SODIUM CHLORIDE 0.9 % IV SOLN
INTRAVENOUS | Status: DC
  Administered 2016-04-22 – 2016-04-26 (×5): via INTRAVENOUS

## 2016-04-22 MED ORDER — AMLODIPINE BESYLATE 5 MG PO TABS
5.0000 mg | ORAL_TABLET | Freq: Every day | ORAL | Status: DC
  Administered 2016-04-22 – 2016-04-26 (×5): 5 mg via ORAL
  Filled 2016-04-22 (×5): qty 1

## 2016-04-22 MED ORDER — ALBUTEROL SULFATE (2.5 MG/3ML) 0.083% IN NEBU: 2.5000 mg | INHALATION_SOLUTION | RESPIRATORY_TRACT | Status: DC | PRN

## 2016-04-22 MED ORDER — IPRATROPIUM-ALBUTEROL 0.5-2.5 (3) MG/3ML IN SOLN: 3.0000 mL | Freq: Once | RESPIRATORY_TRACT | Status: DC

## 2016-04-22 MED ORDER — LEVETIRACETAM 500 MG PO TABS
500.0000 mg | ORAL_TABLET | Freq: Two times a day (BID) | ORAL | Status: DC
  Administered 2016-04-22 – 2016-04-26 (×9): 500 mg via ORAL
  Filled 2016-04-22 (×9): qty 1

## 2016-04-22 MED ORDER — ASPIRIN 325 MG PO TABS
325.0000 mg | ORAL_TABLET | Freq: Every day | ORAL | Status: DC
  Administered 2016-04-22 – 2016-04-26 (×5): 325 mg via ORAL
  Filled 2016-04-22 (×5): qty 1

## 2016-04-22 MED ORDER — LABETALOL HCL 200 MG PO TABS
200.0000 mg | ORAL_TABLET | Freq: Two times a day (BID) | ORAL | Status: DC
  Administered 2016-04-22 – 2016-04-26 (×9): 200 mg via ORAL
  Filled 2016-04-22 (×9): qty 1

## 2016-04-22 MED ORDER — ALBUTEROL (5 MG/ML) CONTINUOUS INHALATION SOLN
10.0000 mg/h | INHALATION_SOLUTION | Freq: Once | RESPIRATORY_TRACT | Status: AC
  Administered 2016-04-22: 10 mg/h via RESPIRATORY_TRACT
  Filled 2016-04-22: qty 20

## 2016-04-22 MED ORDER — IPRATROPIUM-ALBUTEROL 0.5-2.5 (3) MG/3ML IN SOLN
3.0000 mL | Freq: Four times a day (QID) | RESPIRATORY_TRACT | Status: DC
  Administered 2016-04-22 – 2016-04-26 (×17): 3 mL via RESPIRATORY_TRACT
  Filled 2016-04-22 (×17): qty 3

## 2016-04-22 MED ORDER — METHYLPREDNISOLONE SODIUM SUCC 40 MG IJ SOLR
40.0000 mg | Freq: Two times a day (BID) | INTRAMUSCULAR | Status: DC
  Administered 2016-04-22 – 2016-04-23 (×2): 40 mg via INTRAVENOUS
  Filled 2016-04-22 (×2): qty 1

## 2016-04-22 NOTE — H&P (Addendum)
History and Physical  Chris Simpson:[id number] DOB: [birth year] DOA: 04/22/2016  PCP: Rosita Fire, MD  Patient coming from: Home  Chief Complaint: Shortness of breath  HPI:  82 year-old man with a hx of HTN presented to the ED via EMS with complains of shortness of breath since this morning around 1am. Admitted for COPD exacerbation.  Woke up this morning with acute onset of shortness of breath, had some associated dizziness and even double vision briefly. No specific aggravating or alleviating factors except for treatment in the emergency department to improve things. Shortness of breath was severe. He said no previous difficulty breathing, does not use oxygen or bronchodilators and has not been formally diagnosed with COPD. However he has a long history of smoking. Currently his breathing has substantially improved.  ED Course: Afebrile, hypertensive, and tachycardic. No hypoxia. Pertinent labs: BUN 19, creatinine 1.96, and WBC 14.4. Troponin negative. BNP within normal limits. Imaging: Chest x-ray independently reviewed. No acute disease noted. Agree with radiology interpretation.  ROS: Negative for fever, visual changes, sore throat, rash, new muscle aches, chest pain,  dysuria, bleeding, n/v/abdominal pain.  Past Medical History:  Diagnosis Date  . Acute respiratory failure ([place])   . Altered mental status   . Cardiac arrest ([place])   . CVA (cerebral infarction)   . Hypertension   . Hypokalemia   . Seizures ([place])     History reviewed. No pertinent surgical history. No previous surgery reported.   reports that he quit smoking about 4 years ago. His smoking use included Cigarettes. He smoked 0.30 packs per day. He has never used smokeless tobacco. He reports that he does not drink alcohol or use drugs. Ambulatory  No Known Allergies  No family history on file. He is unaware of any particular medical problems in his family  Prior to Admission medications   Medication Sig  Start Date End Date Taking? Authorizing Provider  amLODipine (NORVASC) 5 MG tablet Take 1 tablet (5 mg total) by mouth daily. 02/22/12  Yes Rosita Fire, MD  aspirin 325 MG tablet Take 325 mg by mouth daily.   Yes Historical Provider, MD  labetalol (NORMODYNE) 200 MG tablet Take 1 tablet (200 mg total) by mouth 2 (two) times daily. 01/09/12 04/22/16 Yes Monika Salk, MD  levETIRAcetam (KEPPRA) 500 MG tablet Take 500 mg by mouth 2 (two) times daily. For seizures 01/09/12 04/22/16 Yes Monika Salk, MD    Physical Exam: Vitals:   04/22/16 0618 04/22/16 0630 04/22/16 0700 04/22/16 0715  BP:  162/90 161/80   Pulse:   88 88  Resp:  21 23 20   Temp:      TempSrc:      SpO2: 100%  100% 100%  Weight:      Height:        Constitutional:  . Appears calm and comfortable Eyes:  . PERRL and irises appear normal . Normal conjunctivae and lids ENMT:  . grossly normal hearing . Lips appear normal Neck:  . neck appears normal, no masses, normal ROM, supple . no thyromegaly Respiratory:  . Diiffuse wheezes for all lung fields. . Mild increased respiratory effort.  Cardiovascular:   Distant but otherwise normal . RRR, no m/r/g . No LE extremity edema   Abdomen:  . Abdomen appears normal; no tenderness or masses . No hernias noted Musculoskeletal:   Moves all extremities with grossly normal tone and strength Skin:   No lesions noted, no rash  Nontender palpation, no nodules noted Neurologic:  Grossly normal Psychiatric:  . Mental status Judgment and insight appeared grossly normal o Mood and affect appear normal  Wt Readings from Last 3 Encounters:  04/22/16 74.8 kg (165 lb)  07/16/12 69.4 kg (153 lb)  02/21/12 61 kg (134 lb 7.7 oz)    I have personally reviewed following labs and imaging studies  Labs on Admission:  CBC:  Recent Labs Lab 04/22/16 0615  WBC 14.4*  NEUTROABS 12.0*  HGB 14.0  HCT 42.8  MCV 99.5  PLT 295   Basic Metabolic Panel:  Recent Labs Lab  04/22/16 0615  NA 137  K 4.7  CL 109  CO2 19*  GLUCOSE 142*  BUN 33*  CREATININE 1.96*  CALCIUM 9.2   Cardiac Enzymes:  Recent Labs Lab 04/22/16 0615  TROPONINI <0.03    Radiological Exams on Admission: Dg Chest Portable 1 View  Result Date: 04/22/2016 CLINICAL DATA:  82 y/o  M; shortness of breath. EXAM: PORTABLE CHEST 1 VIEW COMPARISON:  01/05/2012 chest radiograph. FINDINGS: Stable cardiac silhouette. Clear lungs. Blunted right costal diaphragmatic angle, possible small right effusion. No pneumothorax. Bones are unremarkable. IMPRESSION: Clear lungs.  Fossa small right effusion. Electronically Signed   By: Kristine Garbe M.D.   On: 04/22/2016 06:21    EKG: Independently reviewed. Sinus tachycardia, no acute changes  Principal Problem:   COPD exacerbation (HCC) Active Problems:   COPD (chronic obstructive pulmonary disease) (HCC)   Assessment/Plan 1. Acute COPD exacerbation. Diffuse wheezing on examination but no hypoxia. No evidence of systemic infection. Chest x-ray negative. 2. AKI versus progression of chronic kidney disease.  3. HTN, stable 4. Hx of seizures, continue Keppra. 5. Hx of stroke, on ASA. 6. Tobacco use disorder, cigarettes, quit 3 months ago. Smoked since age 23.   Plan observation  Bronchodilators, IV steroids, no indication for antibiotics at the present moment with lack of productive cough  IVF, check BMP in AM  Continue chronic medications  DVT prophylaxis: Lovenox Code Status: Full Family Communication: No family at bedside Disposition Plan: Discharge home once improved   Consults called: None   Admission status: Admit to inpatient    Time spent: 55 minutes  It is my clinical opinion that referral for OBSERVATION is reasonable and necessary in this patient  . presenting with symptoms of shortness of breath, concerning for COPD exacerbation . in the context of PMH including 60+ years of smoking . with pertinent physical  exam findings of diffuse wheezes all lung fields  The aforementioned taken together are felt to place the patient at high risk for further clinical deterioration. However it is anticipated that the patient may be medically stable for discharge from the hospital within 24 to 48 hours.   Murray Hodgkins, MD  [facility] Direct contact: [phone] --Via amion app OR  --[email or web address]; password [email or web address]  7PM-7AM contact night coverage as above  04/22/2016, 7:54 AM  By signing my name below, I, Hilbert Odor, attest that this documentation has been prepared under the direction and in the presence of Wilena Tyndall P. Sarajane Jews, MD. Electronically signed: Hilbert Odor, Scribe.  04/22/16, 9:30 AM  I personally performed the services described in this documentation. All medical record entries made by the scribe were at my direction. I have reviewed the chart and agree that the record reflects my personal performance and is accurate and complete. Murray Hodgkins, MD

## 2016-04-22 NOTE — ED Notes (Signed)
Attempted report x1. 

## 2016-04-22 NOTE — ED Triage Notes (Signed)
Pt arrived by EMS from home. Pt says started having trouble breathing around 0100 this morning and has a dry non productive cough. EMS gave albuterol, a dou neb & 125 of solumedrol. Pt present w/ audible wheezing.

## 2016-04-22 NOTE — ED Provider Notes (Signed)
[facility] DEPT Provider Note   CSN: [id number] Arrival date & time: 04/22/16  0540     History   Chief Complaint Chief Complaint  Patient presents with  . Shortness of Breath    HPI Chris Simpson is a 82 y.o. male.  The history is provided by the patient.  Shortness of Breath  This is a new problem. The average episode lasts 4 hours. The current episode started 3 to 5 hours ago. The problem has been rapidly worsening. Associated symptoms include cough and wheezing. Pertinent negatives include no fever, no chest pain, no vomiting, no abdominal pain and no leg swelling.  Patient with h/o CVA/seizures presents with acute shortness of breath He reports waking up at 1am feeling shortness of breath and dizziness EMS was called and he was noted to be short of breath/wheezing and required nebulized treatments He has nonproductive cough without hemoptysis He is a smoker but claims he quit 3 months ago (he smoked since he was 82 yrs old) He had otherwise been well until tonight He is not on home oxygen.  He is usually ambulatory  Patient has already received albuterol/atrovent and solumedrol prior to arrival with EMS Past Medical History:  Diagnosis Date  . Acute respiratory failure ([place])   . Altered mental status   . Cardiac arrest ([place])   . CVA (cerebral infarction)   . Hypertension   . Hypokalemia   . Seizures [facility])     Patient Active Problem List   Diagnosis Date Noted  . Aspiration pneumonitis ([place]) 01/09/2012  . Stroke ([place]) 01/07/2012  . Hypertensive emergency 01/04/2012  . Acute respiratory failure ([place]) 01/04/2012  . Seizure ([place]) 01/04/2012  . Altered mental status 01/04/2012  . Hypokalemia 01/04/2012  . CRI (chronic renal insufficiency) 01/04/2012  . Hypertension     History reviewed. No pertinent surgical history.     Home Medications    Prior to Admission medications   Medication Sig Start Date End Date Taking? Authorizing Provider  amLODipine  (NORVASC) 5 MG tablet Take 1 tablet (5 mg total) by mouth daily. 02/22/12  Yes Rosita Fire, MD  aspirin 325 MG tablet Take 325 mg by mouth daily.   Yes Historical Provider, MD  labetalol (NORMODYNE) 200 MG tablet Take 1 tablet (200 mg total) by mouth 2 (two) times daily. 01/09/12 04/22/16 Yes Monika Salk, MD  levETIRAcetam (KEPPRA) 500 MG tablet Take 500 mg by mouth 2 (two) times daily. For seizures 01/09/12 04/22/16 Yes Monika Salk, MD    Family History No family history on file.  Social History Social History  Substance Use Topics  . Smoking status: Former Smoker    Packs/day: 0.30    Types: Cigarettes    Quit date: 11/26/2011  . Smokeless tobacco: Never Used  . Alcohol use No     Allergies   Patient has no known allergies.   Review of Systems Review of Systems  Constitutional: Negative for fever.  Respiratory: Positive for cough, shortness of breath and wheezing.   Cardiovascular: Negative for chest pain and leg swelling.  Gastrointestinal: Negative for abdominal pain and vomiting.  All other systems reviewed and are negative.    Physical Exam Updated Vital Signs BP 172/91   Pulse 100   Temp 97.8 F (36.6 C) (Oral)   Resp 24   Ht 5\' 8"  (1.727 m)   Wt 74.8 kg   SpO2 100%   BMI 25.09 kg/m   Physical Exam CONSTITUTIONAL: Elderly, frail HEAD: Normocephalic/atraumatic EYES:  EOMI (blind in OS per patient) ENMT: Mucous membranes moist, poor dentition NECK: supple no meningeal signs SPINE/BACK:entire spine nontender CV: tachycardic, heart sounds obscured by wheezing LUNGS:tachypnea and wheezing noted bilaterally ABDOMEN: soft, nontender GU:no cva tenderness NEURO: Pt is awake/alert/appropriate, moves all extremitiesx4.  No facial droop.  No arm/leg drift EXTREMITIES: pulses normal/equal, full ROM, no LE edema SKIN: warm, color normal PSYCH: no abnormalities of mood noted, alert and oriented to situation   ED Treatments / Results  Labs (all labs ordered are  listed, but only abnormal results are displayed) Labs Reviewed  CBC WITH DIFFERENTIAL/PLATELET - Abnormal; Notable for the following:       Result Value   WBC 14.4 (*)    Neutro Abs 12.0 (*)    All other components within normal limits  BASIC METABOLIC PANEL  TROPONIN I  BRAIN NATRIURETIC PEPTIDE    EKG  EKG Interpretation  Date/Time:  Sunday April 22 2016 05:54:00 EST Ventricular Rate:  103 PR Interval:    QRS Duration: 90 QT Interval:  336 QTC Calculation: 440 R Axis:   25 Text Interpretation:  Sinus tachycardia Prolonged PR interval Interpretation limited secondary to artifact No significant change since last tracing Confirmed by Christy Gentles  MD, [place] ([id number]) on 04/22/2016 6:33:22 AM       Radiology Dg Chest Portable 1 View  Result Date: 04/22/2016 CLINICAL DATA:  82 y/o  M; shortness of breath. EXAM: PORTABLE CHEST 1 VIEW COMPARISON:  01/05/2012 chest radiograph. FINDINGS: Stable cardiac silhouette. Clear lungs. Blunted right costal diaphragmatic angle, possible small right effusion. No pneumothorax. Bones are unremarkable. IMPRESSION: Clear lungs.  Fossa small right effusion. Electronically Signed   By: Kristine Garbe M.D.   On: 04/22/2016 06:21    Procedures Procedures  CRITICAL CARE Performed by: Sharyon Cable Total critical care time: 35 minutes Critical care time was exclusive of separately billable procedures and treating other patients. Critical care was necessary to treat or prevent imminent or life-threatening deterioration. Critical care was time spent personally by me on the following activities: development of treatment plan with patient and/or surrogate as well as nursing, discussions with consultants, evaluation of patient's response to treatment, examination of patient, obtaining history from patient or surrogate, ordering and performing treatments and interventions, ordering and review of laboratory studies, ordering and review of  radiographic studies, pulse oximetry and re-evaluation of patient's condition. PATIENT WITH WHEEZING/SHORTNESS OF BREATHING REQUIRING NEBULIZED TREATMENTS INCLUDING HOUR LONG ALBUTEROL AND WILL REQUIRE ADMISSION  Medications Ordered in ED Medications  albuterol (PROVENTIL,VENTOLIN) solution continuous neb (10 mg/hr Nebulization Given 04/22/16 4562)     Initial Impression / Assessment and Plan / ED Course  I have reviewed the triage vital signs and the nursing notes.  Pertinent labs & imaging results that were available during my care of the patient were reviewed by me and considered in my medical decision making (see chart for details).  Clinical Course    6:49 AM PT WITH ACUTE SHORTNESS OF BREATH HE WAS INITIALLY TACHYPNEIC DESPITE NEB TREATMENTS GIVEN AGE/HISTORY/PRESENTATION HE WILL REQUIRE ADMISSION 7:21 AM Pt with some improvement but still tachypneic/wheezing Will admit D/w dr Sarajane Jews for admission No signs of CHF, and I doubt PE given history/exam at this time BP 161/80   Pulse 88   Temp 97.8 F (36.6 C) (Oral)   Resp 23   Ht 5\' 8"  (1.727 m)   Wt 74.8 kg   SpO2 100%   BMI 25.09 kg/m    Final Clinical Impressions(s) / ED Diagnoses  Final diagnoses:  Acute respiratory failure, unspecified whether with hypoxia or hypercapnia (HCC)  COPD exacerbation ([place])    New Prescriptions New Prescriptions   No medications on file     Ripley Fraise, MD 04/22/16 [phone]

## 2016-04-22 NOTE — ED Notes (Signed)
Pt placed on 2l O2 via nasal canula.

## 2016-04-23 DIAGNOSIS — N178 Other acute kidney failure: Secondary | ICD-10-CM | POA: Diagnosis not present

## 2016-04-23 DIAGNOSIS — I10 Essential (primary) hypertension: Secondary | ICD-10-CM | POA: Diagnosis not present

## 2016-04-23 DIAGNOSIS — J449 Chronic obstructive pulmonary disease, unspecified: Secondary | ICD-10-CM | POA: Diagnosis not present

## 2016-04-23 LAB — BASIC METABOLIC PANEL
ANION GAP: 5 (ref 5–15)
BUN: 38 mg/dL — ABNORMAL HIGH (ref 6–20)
CALCIUM: 8.6 mg/dL — AB (ref 8.9–10.3)
CHLORIDE: 115 mmol/L — AB (ref 101–111)
CO2: 19 mmol/L — AB (ref 22–32)
Creatinine, Ser: 2.1 mg/dL — ABNORMAL HIGH (ref 0.61–1.24)
GFR calc non Af Amer: 28 mL/min — ABNORMAL LOW (ref >60)
GFR, EST AFRICAN AMERICAN: 32 mL/min — AB (ref >60)
Glucose, Bld: 135 mg/dL — ABNORMAL HIGH (ref 65–99)
POTASSIUM: 4.8 mmol/L (ref 3.5–5.1)
Sodium: 139 mmol/L (ref 135–145)

## 2016-04-23 MED ORDER — AZITHROMYCIN 250 MG PO TABS
500.0000 mg | ORAL_TABLET | Freq: Every day | ORAL | Status: AC
  Administered 2016-04-23: 500 mg via ORAL
  Filled 2016-04-23: qty 2

## 2016-04-23 MED ORDER — AZITHROMYCIN 250 MG PO TABS
250.0000 mg | ORAL_TABLET | Freq: Every day | ORAL | Status: DC
  Administered 2016-04-24 – 2016-04-26 (×3): 250 mg via ORAL
  Filled 2016-04-23 (×4): qty 1

## 2016-04-23 MED ORDER — METHYLPREDNISOLONE SODIUM SUCC 125 MG IJ SOLR
80.0000 mg | Freq: Four times a day (QID) | INTRAMUSCULAR | Status: DC
  Administered 2016-04-23 – 2016-04-24 (×3): 80 mg via INTRAVENOUS
  Filled 2016-04-23 (×3): qty 2

## 2016-04-23 NOTE — Progress Notes (Signed)
Subjective: This is an 82 years old male with history of multiple medical illnesses who admitted due to COPD exacerbation. Patient continue to have cough, congestions and wheezing. No fever or chills. No chest pain.  Objective: Vital signs in last 24 hours: Temp:  [98 F (36.7 C)-98.8 F (37.1 C)] 98 F (36.7 C) (11/27 0500) Pulse Rate:  [78-96] 79 (11/27 0500) Resp:  [18-22] 18 (11/27 0500) BP: (129-163)/(65-83) 129/65 (11/27 0500) SpO2:  [91 %-100 %] 99 % (11/27 0733) Weight change:  Last BM Date: 04/21/16  Intake/Output from previous day: 11/26 0701 - 11/27 0700 In: 2167.5 [P.O.:770; I.V.:1397.5] Out: 250 [Urine:250]  PHYSICAL EXAM General appearance: alert and no distress Resp: diminished breath sounds bilaterally and wheezes bilaterally Cardio: S1, S2 normal GI: soft, non-tender; bowel sounds normal; no masses,  no organomegaly Extremities: extremities normal, atraumatic, no cyanosis or edema  Lab Results:  Results for orders placed or performed during the hospital encounter of 04/22/16 (from the past 48 hour(s))  Brain natriuretic peptide     Status: None   Collection Time: 04/22/16  5:55 AM  Result Value Ref Range   B Natriuretic Peptide 43.0 0.0 - 100.0 pg/mL  Basic metabolic panel     Status: Abnormal   Collection Time: 04/22/16  6:15 AM  Result Value Ref Range   Sodium 137 135 - 145 mmol/L   Potassium 4.7 3.5 - 5.1 mmol/L   Chloride 109 101 - 111 mmol/L   CO2 19 (L) 22 - 32 mmol/L   Glucose, Bld 142 (H) 65 - 99 mg/dL   BUN 33 (H) 6 - 20 mg/dL   Creatinine, Ser 5.29 (H) 0.61 - 1.24 mg/dL   Calcium 9.2 8.9 - 09.5 mg/dL   GFR calc non Af Amer 30 (L) >60 mL/min   GFR calc Af Amer 35 (L) >60 mL/min    Comment: (NOTE) The eGFR has been calculated using the CKD EPI equation. This calculation has not been validated in all clinical situations. eGFR's persistently <60 mL/min signify possible Chronic Kidney Disease.    Anion gap 9 5 - 15  CBC with  Differential/Platelet     Status: Abnormal   Collection Time: 04/22/16  6:15 AM  Result Value Ref Range   WBC 14.4 (H) 4.0 - 10.5 K/uL   RBC 4.30 4.22 - 5.81 MIL/uL   Hemoglobin 14.0 13.0 - 17.0 g/dL   HCT 04.7 39.4 - 26.1 %   MCV 99.5 78.0 - 100.0 fL   MCH 32.6 26.0 - 34.0 pg   MCHC 32.7 30.0 - 36.0 g/dL   RDW 03.1 06.5 - 83.9 %   Platelets 264 150 - 400 K/uL   Neutrophils Relative % 84 %   Neutro Abs 12.0 (H) 1.7 - 7.7 K/uL   Lymphocytes Relative 8 %   Lymphs Abs 1.2 0.7 - 4.0 K/uL   Monocytes Relative 7 %   Monocytes Absolute 1.0 0.1 - 1.0 K/uL   Eosinophils Relative 1 %   Eosinophils Absolute 0.2 0.0 - 0.7 K/uL   Basophils Relative 0 %   Basophils Absolute 0.0 0.0 - 0.1 K/uL  Troponin I     Status: None   Collection Time: 04/22/16  6:15 AM  Result Value Ref Range   Troponin I <0.03 <0.03 ng/mL  Basic metabolic panel     Status: Abnormal   Collection Time: 04/23/16  5:10 AM  Result Value Ref Range   Sodium 139 135 - 145 mmol/L   Potassium 4.8 3.5 -  5.1 mmol/L   Chloride 115 (H) 101 - 111 mmol/L   CO2 19 (L) 22 - 32 mmol/L   Glucose, Bld 135 (H) 65 - 99 mg/dL   BUN 38 (H) 6 - 20 mg/dL   Creatinine, Ser 2.10 (H) 0.61 - 1.24 mg/dL   Calcium 8.6 (L) 8.9 - 10.3 mg/dL   GFR calc non Af Amer 28 (L) >60 mL/min   GFR calc Af Amer 32 (L) >60 mL/min    Comment: (NOTE) The eGFR has been calculated using the CKD EPI equation. This calculation has not been validated in all clinical situations. eGFR's persistently <60 mL/min signify possible Chronic Kidney Disease.    Anion gap 5 5 - 15    ABGS No results for input(s): PHART, PO2ART, TCO2, HCO3 in the last 72 hours.  Invalid input(s): PCO2 CULTURES No results found for this or any previous visit (from the past 240 hour(s)). Studies/Results: Dg Chest Portable 1 View  Result Date: 04/22/2016 CLINICAL DATA:  82 y/o  M; shortness of breath. EXAM: PORTABLE CHEST 1 VIEW COMPARISON:  01/05/2012 chest radiograph. FINDINGS:  Stable cardiac silhouette. Clear lungs. Blunted right costal diaphragmatic angle, possible small right effusion. No pneumothorax. Bones are unremarkable. IMPRESSION: Clear lungs.  Fossa small right effusion. Electronically Signed   By: Kristine Garbe M.D.   On: 04/22/2016 06:21    Medications: I have reviewed the patient's current medications.  Assesment:  Principal Problem:   COPD exacerbation ([place]) Active Problems:   COPD (chronic obstructive pulmonary disease) (HCC) Acute on chronic renal failure hypertension   Plan: Medications reviewed Will increase IV solumedrol to 80 mg iv q 6 hours Continue IV fluid Z-pack Will monitor BMp    LOS: 1 day   Brodi Kari 04/23/2016, 8:24 AM

## 2016-04-24 DIAGNOSIS — J449 Chronic obstructive pulmonary disease, unspecified: Secondary | ICD-10-CM | POA: Diagnosis not present

## 2016-04-24 DIAGNOSIS — N178 Other acute kidney failure: Secondary | ICD-10-CM | POA: Diagnosis not present

## 2016-04-24 DIAGNOSIS — I10 Essential (primary) hypertension: Secondary | ICD-10-CM | POA: Diagnosis not present

## 2016-04-24 LAB — BASIC METABOLIC PANEL
Anion gap: 6 (ref 5–15)
BUN: 45 mg/dL — AB (ref 6–20)
CHLORIDE: 115 mmol/L — AB (ref 101–111)
CO2: 19 mmol/L — ABNORMAL LOW (ref 22–32)
CREATININE: 1.92 mg/dL — AB (ref 0.61–1.24)
Calcium: 8.9 mg/dL (ref 8.9–10.3)
GFR calc Af Amer: 36 mL/min — ABNORMAL LOW (ref >60)
GFR calc non Af Amer: 31 mL/min — ABNORMAL LOW (ref >60)
GLUCOSE: 119 mg/dL — AB (ref 65–99)
Potassium: 4.7 mmol/L (ref 3.5–5.1)
SODIUM: 140 mmol/L (ref 135–145)

## 2016-04-24 MED ORDER — METHYLPREDNISOLONE SODIUM SUCC 125 MG IJ SOLR
80.0000 mg | Freq: Two times a day (BID) | INTRAMUSCULAR | Status: DC
  Administered 2016-04-24 – 2016-04-25 (×2): 80 mg via INTRAVENOUS
  Filled 2016-04-24 (×2): qty 2

## 2016-04-24 NOTE — Progress Notes (Signed)
Subjective: Patient feels slightly better. His breathing and renal function is improving. No fever or chills.  Objective: Vital signs in last 24 hours: Temp:  [97.5 F (36.4 C)-98.3 F (36.8 C)] 97.5 F (36.4 C) (11/28 0500) Pulse Rate:  [61-79] 61 (11/28 0500) Resp:  [20] 20 (11/28 0500) BP: (132-164)/(60-79) 132/60 (11/28 0500) SpO2:  [91 %-96 %] 94 % (11/28 0737) Weight change:  Last BM Date: 04/23/16  Intake/Output from previous day: 11/27 0701 - 11/28 0700 In: 240 [P.O.:240] Out: 675 [Urine:675]  PHYSICAL EXAM General appearance: alert and no distress Resp: diminished breath sounds bilaterally and wheezes bilaterally Cardio: S1, S2 normal GI: soft, non-tender; bowel sounds normal; no masses,  no organomegaly Extremities: extremities normal, atraumatic, no cyanosis or edema  Lab Results:  Results for orders placed or performed during the hospital encounter of 04/22/16 (from the past 48 hour(s))  Basic metabolic panel     Status: Abnormal   Collection Time: 04/23/16  5:10 AM  Result Value Ref Range   Sodium 139 135 - 145 mmol/L   Potassium 4.8 3.5 - 5.1 mmol/L   Chloride 115 (H) 101 - 111 mmol/L   CO2 19 (L) 22 - 32 mmol/L   Glucose, Bld 135 (H) 65 - 99 mg/dL   BUN 38 (H) 6 - 20 mg/dL   Creatinine, Ser 2.10 (H) 0.61 - 1.24 mg/dL   Calcium 8.6 (L) 8.9 - 10.3 mg/dL   GFR calc non Af Amer 28 (L) >60 mL/min   GFR calc Af Amer 32 (L) >60 mL/min    Comment: (NOTE) The eGFR has been calculated using the CKD EPI equation. This calculation has not been validated in all clinical situations. eGFR's persistently <60 mL/min signify possible Chronic Kidney Disease.    Anion gap 5 5 - 15  Basic metabolic panel     Status: Abnormal   Collection Time: 04/24/16  4:58 AM  Result Value Ref Range   Sodium 140 135 - 145 mmol/L   Potassium 4.7 3.5 - 5.1 mmol/L   Chloride 115 (H) 101 - 111 mmol/L   CO2 19 (L) 22 - 32 mmol/L   Glucose, Bld 119 (H) 65 - 99 mg/dL   BUN 45 (H) 6 -  20 mg/dL   Creatinine, Ser 1.92 (H) 0.61 - 1.24 mg/dL   Calcium 8.9 8.9 - 10.3 mg/dL   GFR calc non Af Amer 31 (L) >60 mL/min   GFR calc Af Amer 36 (L) >60 mL/min    Comment: (NOTE) The eGFR has been calculated using the CKD EPI equation. This calculation has not been validated in all clinical situations. eGFR's persistently <60 mL/min signify possible Chronic Kidney Disease.    Anion gap 6 5 - 15    ABGS No results for input(s): PHART, PO2ART, TCO2, HCO3 in the last 72 hours.  Invalid input(s): PCO2 CULTURES No results found for this or any previous visit (from the past 240 hour(s)). Studies/Results: No results found.  Medications: I have reviewed the patient's current medications.  Assesment:  Principal Problem:   COPD exacerbation ([place]) Active Problems:   COPD (chronic obstructive pulmonary disease) ([place]) Acute on chronic renal failure hypertension   Plan: Medications reviewed Will adjust IV solumedrol Continue IV fluid Will monitor BMp    LOS: 2 days   Brinae Woods 04/24/2016, 8:00 AM

## 2016-04-24 NOTE — Care Management Note (Signed)
Case Management Note  Patient Details  Name: Chris Simpson MRN: [id number] Date of Birth: Mar 17, 1932  Subjective/Objective:    Patient adm with COPD exacerbation. He lives alone, has aide M-F for 3 hours a day. Walks with a cane, has a PCP, transportation and insurance with drug coverage.  Currently on room air.    Action/Plan:  Anticipate DC home with self care.  Expected Discharge Date:  04/25/16               Expected Discharge Plan:  Home/Self Care  In-House Referral:  NA  Discharge planning Services  CM Consult  Post Acute Care Choice:  NA Choice offered to:  NA  DME Arranged:    DME Agency:     HH Arranged:    HH Agency:     Status of Service:  In process, will continue to follow  If discussed at Long Length of Stay Meetings, dates discussed:    Additional Comments:  Thomes Burak, Chauncey Reading, RN 04/24/2016, 3:14 PM

## 2016-04-25 DIAGNOSIS — I10 Essential (primary) hypertension: Secondary | ICD-10-CM | POA: Diagnosis not present

## 2016-04-25 DIAGNOSIS — N178 Other acute kidney failure: Secondary | ICD-10-CM | POA: Diagnosis not present

## 2016-04-25 DIAGNOSIS — J449 Chronic obstructive pulmonary disease, unspecified: Secondary | ICD-10-CM | POA: Diagnosis not present

## 2016-04-25 LAB — CBC
HCT: 38 % — ABNORMAL LOW (ref 39.0–52.0)
Hemoglobin: 12.5 g/dL — ABNORMAL LOW (ref 13.0–17.0)
MCH: 32.4 pg (ref 26.0–34.0)
MCHC: 32.9 g/dL (ref 30.0–36.0)
MCV: 98.4 fL (ref 78.0–100.0)
PLATELETS: 265 10*3/uL (ref 150–400)
RBC: 3.86 MIL/uL — ABNORMAL LOW (ref 4.22–5.81)
RDW: 15.9 % — AB (ref 11.5–15.5)
WBC: 14.4 10*3/uL — AB (ref 4.0–10.5)

## 2016-04-25 LAB — BASIC METABOLIC PANEL
Anion gap: 7 (ref 5–15)
BUN: 47 mg/dL — AB (ref 6–20)
CHLORIDE: 115 mmol/L — AB (ref 101–111)
CO2: 19 mmol/L — ABNORMAL LOW (ref 22–32)
CREATININE: 1.78 mg/dL — AB (ref 0.61–1.24)
Calcium: 8.5 mg/dL — ABNORMAL LOW (ref 8.9–10.3)
GFR calc Af Amer: 39 mL/min — ABNORMAL LOW (ref >60)
GFR, EST NON AFRICAN AMERICAN: 34 mL/min — AB (ref >60)
Glucose, Bld: 137 mg/dL — ABNORMAL HIGH (ref 65–99)
Potassium: 4.2 mmol/L (ref 3.5–5.1)
SODIUM: 141 mmol/L (ref 135–145)

## 2016-04-25 MED ORDER — METHYLPREDNISOLONE SODIUM SUCC 40 MG IJ SOLR
40.0000 mg | Freq: Two times a day (BID) | INTRAMUSCULAR | Status: DC
  Administered 2016-04-25 – 2016-04-26 (×2): 40 mg via INTRAVENOUS
  Filled 2016-04-25 (×2): qty 1

## 2016-04-25 NOTE — Progress Notes (Signed)
Subjective: Patient continue to wheezes and his chest still tight, but better than when he was admitted. His renal function improving gradually..  Objective: Vital signs in last 24 hours: Temp:  [96.6 F (35.9 C)-98 F (36.7 C)] 98 F (36.7 C) (11/29 0500) Pulse Rate:  [70-83] 83 (11/29 0500) Resp:  [20] 20 (11/29 0500) BP: (155-176)/(69-94) 176/87 (11/29 0500) SpO2:  [93 %-99 %] 94 % (11/29 0719) Weight change:  Last BM Date: 04/23/16  Intake/Output from previous day: 11/28 0701 - 11/29 0700 In: 240 [P.O.:240] Out: 1400 [Urine:1400]  PHYSICAL EXAM General appearance: alert and no distress Resp: diminished breath sounds bilaterally and wheezes bilaterally Cardio: S1, S2 normal GI: soft, non-tender; bowel sounds normal; no masses,  no organomegaly Extremities: extremities normal, atraumatic, no cyanosis or edema  Lab Results:  Results for orders placed or performed during the hospital encounter of 04/22/16 (from the past 48 hour(s))  Basic metabolic panel     Status: Abnormal   Collection Time: 04/24/16  4:58 AM  Result Value Ref Range   Sodium 140 135 - 145 mmol/L   Potassium 4.7 3.5 - 5.1 mmol/L   Chloride 115 (H) 101 - 111 mmol/L   CO2 19 (L) 22 - 32 mmol/L   Glucose, Bld 119 (H) 65 - 99 mg/dL   BUN 45 (H) 6 - 20 mg/dL   Creatinine, Ser 8.95 (H) 0.61 - 1.24 mg/dL   Calcium 8.9 8.9 - 87.1 mg/dL   GFR calc non Af Amer 31 (L) >60 mL/min   GFR calc Af Amer 36 (L) >60 mL/min    Comment: (NOTE) The eGFR has been calculated using the CKD EPI equation. This calculation has not been validated in all clinical situations. eGFR's persistently <60 mL/min signify possible Chronic Kidney Disease.    Anion gap 6 5 - 15  Basic metabolic panel     Status: Abnormal   Collection Time: 04/25/16  4:45 AM  Result Value Ref Range   Sodium 141 135 - 145 mmol/L   Potassium 4.2 3.5 - 5.1 mmol/L   Chloride 115 (H) 101 - 111 mmol/L   CO2 19 (L) 22 - 32 mmol/L   Glucose, Bld 137 (H) 65  - 99 mg/dL   BUN 47 (H) 6 - 20 mg/dL   Creatinine, Ser 6.28 (H) 0.61 - 1.24 mg/dL   Calcium 8.5 (L) 8.9 - 10.3 mg/dL   GFR calc non Af Amer 34 (L) >60 mL/min   GFR calc Af Amer 39 (L) >60 mL/min    Comment: (NOTE) The eGFR has been calculated using the CKD EPI equation. This calculation has not been validated in all clinical situations. eGFR's persistently <60 mL/min signify possible Chronic Kidney Disease.    Anion gap 7 5 - 15  CBC     Status: Abnormal   Collection Time: 04/25/16  4:45 AM  Result Value Ref Range   WBC 14.4 (H) 4.0 - 10.5 K/uL   RBC 3.86 (L) 4.22 - 5.81 MIL/uL   Hemoglobin 12.5 (L) 13.0 - 17.0 g/dL   HCT 44.5 (L) 57.6 - 93.8 %   MCV 98.4 78.0 - 100.0 fL   MCH 32.4 26.0 - 34.0 pg   MCHC 32.9 30.0 - 36.0 g/dL   RDW 20.9 (H) 74.1 - 00.5 %   Platelets 265 150 - 400 K/uL    ABGS No results for input(s): PHART, PO2ART, TCO2, HCO3 in the last 72 hours.  Invalid input(s): PCO2 CULTURES No results found for this or  any previous visit (from the past 240 hour(s)). Studies/Results: No results found.  Medications: I have reviewed the patient's current medications.  Assesment:  Principal Problem:   COPD exacerbation ([place]) Active Problems:   COPD (chronic obstructive pulmonary disease) (HCC) Acute on chronic renal failure hypertension   Plan: Medications reviewed Will decrease solumedrol to 40 mg IV q 12 hours Continue IV fluid Will monitor BMp    LOS: 3 days   Collie Kittel 04/25/2016, 8:10 AM

## 2016-04-25 NOTE — Care Management Important Message (Signed)
Important Message  Patient Details  Name: Chris Simpson MRN: [id number] Date of Birth: Nov 26, 1931   Medicare Important Message Given:  Yes    Kalissa Grays, Chauncey Reading, RN 04/25/2016, 2:00 PM

## 2016-04-26 DIAGNOSIS — N178 Other acute kidney failure: Secondary | ICD-10-CM | POA: Diagnosis not present

## 2016-04-26 DIAGNOSIS — I10 Essential (primary) hypertension: Secondary | ICD-10-CM | POA: Diagnosis not present

## 2016-04-26 DIAGNOSIS — J449 Chronic obstructive pulmonary disease, unspecified: Secondary | ICD-10-CM | POA: Diagnosis not present

## 2016-04-26 LAB — BASIC METABOLIC PANEL
ANION GAP: 6 (ref 5–15)
BUN: 46 mg/dL — ABNORMAL HIGH (ref 6–20)
CALCIUM: 8.1 mg/dL — AB (ref 8.9–10.3)
CO2: 21 mmol/L — AB (ref 22–32)
CREATININE: 1.59 mg/dL — AB (ref 0.61–1.24)
Chloride: 114 mmol/L — ABNORMAL HIGH (ref 101–111)
GFR, EST AFRICAN AMERICAN: 45 mL/min — AB (ref >60)
GFR, EST NON AFRICAN AMERICAN: 39 mL/min — AB (ref >60)
Glucose, Bld: 114 mg/dL — ABNORMAL HIGH (ref 65–99)
Potassium: 4.2 mmol/L (ref 3.5–5.1)
SODIUM: 141 mmol/L (ref 135–145)

## 2016-04-26 MED ORDER — ALBUTEROL SULFATE HFA 108 (90 BASE) MCG/ACT IN AERS: 2.0000 | INHALATION_SPRAY | Freq: Four times a day (QID) | RESPIRATORY_TRACT | 2 refills | Status: AC | PRN

## 2016-04-26 MED ORDER — TIOTROPIUM BROMIDE MONOHYDRATE 18 MCG IN CAPS: 18.0000 ug | ORAL_CAPSULE | Freq: Every morning | RESPIRATORY_TRACT | 12 refills | Status: DC

## 2016-04-26 MED ORDER — PREDNISONE 10 MG (21) PO TBPK: ORAL_TABLET | ORAL | 0 refills | Status: DC

## 2016-04-26 NOTE — Discharge Summary (Addendum)
Physician Discharge Summary  Patient ID: Chris Simpson MRN: [id number] DOB/AGE: [birth year] 82 y.o. Primary Care Physician:Monna Crean, MD Admit date: 04/22/2016 Discharge date: 04/26/2016    Discharge Diagnoses:   Principal Problem:   COPD exacerbation ([employer]) Active Problems:   COPD (chronic obstructive pulmonary disease) ([place]) Acute on chronic renal failure Hypertension    Medication List    TAKE these medications   albuterol 108 (90 Base) MCG/ACT inhaler Commonly known as:  PROVENTIL HFA;VENTOLIN HFA Inhale 2 puffs into the lungs every 6 (six) hours as needed for wheezing or shortness of breath.   amLODipine 5 MG tablet Commonly known as:  NORVASC Take 1 tablet (5 mg total) by mouth daily.   aspirin 325 MG tablet Take 325 mg by mouth daily.   labetalol 200 MG tablet Commonly known as:  NORMODYNE Take 1 tablet (200 mg total) by mouth 2 (two) times daily.   levETIRAcetam 500 MG tablet Commonly known as:  KEPPRA Take 500 mg by mouth 2 (two) times daily. For seizures   predniSONE 10 MG (21) Tbpk tablet Commonly known as:  STERAPRED UNI-PAK 21 TAB 40 mg po daily for 3 days, 30 mg po daily for 3 days, 2 tab po daily for 3 days and 1 tab po daily for 3 days   tiotropium 18 MCG inhalation capsule Commonly known as:  SPIRIVA HANDIHALER Place 1 capsule (18 mcg total) into inhaler and inhale every morning.       Discharged Condition: improved    Consults: none  Significant Diagnostic Studies: Dg Chest Portable 1 View  Result Date: 04/22/2016 CLINICAL DATA:  82 y/o  M; shortness of breath. EXAM: PORTABLE CHEST 1 VIEW COMPARISON:  01/05/2012 chest radiograph. FINDINGS: Stable cardiac silhouette. Clear lungs. Blunted right costal diaphragmatic angle, possible small right effusion. No pneumothorax. Bones are unremarkable. IMPRESSION: Clear lungs.  Fossa small right effusion. Electronically Signed   By: Kristine Garbe M.D.   On: 04/22/2016 06:21    Lab  Results: Basic Metabolic Panel:  Recent Labs  04/25/16 0445 04/26/16 0452  NA 141 141  K 4.2 4.2  CL 115* 114*  CO2 19* 21*  GLUCOSE 137* 114*  BUN 47* 46*  CREATININE 1.78* 1.59*  CALCIUM 8.5* 8.1*   Liver Function Tests: No results for input(s): AST, ALT, ALKPHOS, BILITOT, PROT, ALBUMIN in the last 72 hours.   CBC:  Recent Labs  04/25/16 0445  WBC 14.4*  HGB 12.5*  HCT 38.0*  MCV 98.4  PLT 265    No results found for this or any previous visit (from the past 240 hour(s)).   Hospital Course:   This is an 82 years old male with history of multiple medical illnesses was admitted due to acute exacerbation of COPD. His BUN/Cr was also elevated. Patient is started on IV steroid and nebulizer treatment. He was gradually rehydrated. His wheezing cough and shortness of breath improved. His renal function is also much better. Patient is being discharged on oral steroid and inhalers.  Discharge Exam: Blood pressure (!) 158/76, pulse 65, temperature 98.1 F (36.7 C), temperature source Oral, resp. rate 20, height 5\' 8"  (1.727 m), weight 74.8 kg (165 lb), SpO2 98 %.    Disposition:  Home    Follow-up Information    Cloud Graham, MD Follow up in 1 week(s).   Specialty:  Internal Medicine Contact information: [address] [address] [phone]           Signed: Rosita Fire   04/26/2016, 8:17  AM

## 2016-04-30 NOTE — Progress Notes (Signed)
Discharge instructions given, verbalized understanding, out in stable condition via w/c with staff. 

## 2016-05-03 DIAGNOSIS — I10 Essential (primary) hypertension: Secondary | ICD-10-CM | POA: Diagnosis not present

## 2016-05-03 DIAGNOSIS — N182 Chronic kidney disease, stage 2 (mild): Secondary | ICD-10-CM | POA: Diagnosis not present

## 2016-05-03 DIAGNOSIS — J449 Chronic obstructive pulmonary disease, unspecified: Secondary | ICD-10-CM | POA: Diagnosis not present

## 2016-05-29 DIAGNOSIS — R569 Unspecified convulsions: Secondary | ICD-10-CM | POA: Diagnosis not present

## 2016-05-29 DIAGNOSIS — F17200 Nicotine dependence, unspecified, uncomplicated: Secondary | ICD-10-CM | POA: Diagnosis not present

## 2016-05-29 DIAGNOSIS — G8190 Hemiplegia, unspecified affecting unspecified side: Secondary | ICD-10-CM | POA: Diagnosis not present

## 2016-05-29 DIAGNOSIS — D731 Hypersplenism: Secondary | ICD-10-CM | POA: Diagnosis not present

## 2016-05-29 DIAGNOSIS — F17218 Nicotine dependence, cigarettes, with other nicotine-induced disorders: Secondary | ICD-10-CM | POA: Diagnosis not present

## 2016-05-29 DIAGNOSIS — Z23 Encounter for immunization: Secondary | ICD-10-CM | POA: Diagnosis not present

## 2016-07-27 DIAGNOSIS — I6350 Cerebral infarction due to unspecified occlusion or stenosis of unspecified cerebral artery: Secondary | ICD-10-CM | POA: Diagnosis not present

## 2016-07-27 DIAGNOSIS — I10 Essential (primary) hypertension: Secondary | ICD-10-CM | POA: Diagnosis not present

## 2016-08-27 DIAGNOSIS — J449 Chronic obstructive pulmonary disease, unspecified: Secondary | ICD-10-CM | POA: Diagnosis not present

## 2016-08-27 DIAGNOSIS — I6350 Cerebral infarction due to unspecified occlusion or stenosis of unspecified cerebral artery: Secondary | ICD-10-CM | POA: Diagnosis not present

## 2016-09-26 DIAGNOSIS — I6350 Cerebral infarction due to unspecified occlusion or stenosis of unspecified cerebral artery: Secondary | ICD-10-CM | POA: Diagnosis not present

## 2016-09-26 DIAGNOSIS — I10 Essential (primary) hypertension: Secondary | ICD-10-CM | POA: Diagnosis not present

## 2016-10-27 DIAGNOSIS — J449 Chronic obstructive pulmonary disease, unspecified: Secondary | ICD-10-CM | POA: Diagnosis not present

## 2016-10-27 DIAGNOSIS — I6350 Cerebral infarction due to unspecified occlusion or stenosis of unspecified cerebral artery: Secondary | ICD-10-CM | POA: Diagnosis not present

## 2016-11-30 DIAGNOSIS — J449 Chronic obstructive pulmonary disease, unspecified: Secondary | ICD-10-CM | POA: Diagnosis not present

## 2016-11-30 DIAGNOSIS — I6350 Cerebral infarction due to unspecified occlusion or stenosis of unspecified cerebral artery: Secondary | ICD-10-CM | POA: Diagnosis not present

## 2016-12-28 IMAGING — US US RENAL
1 series · 14 of 25 positions shown · non-contrast
Comparison: Report of an abdominal ultrasound October 30, 2001

CLINICAL DATA: Chronic renal insufficiency; follow-up study

EXAM:
RENAL / URINARY TRACT ULTRASOUND COMPLETE

[Series 1: us renal · 0.24mm/px · 14 of 35 slices shown]
[im 1/35]
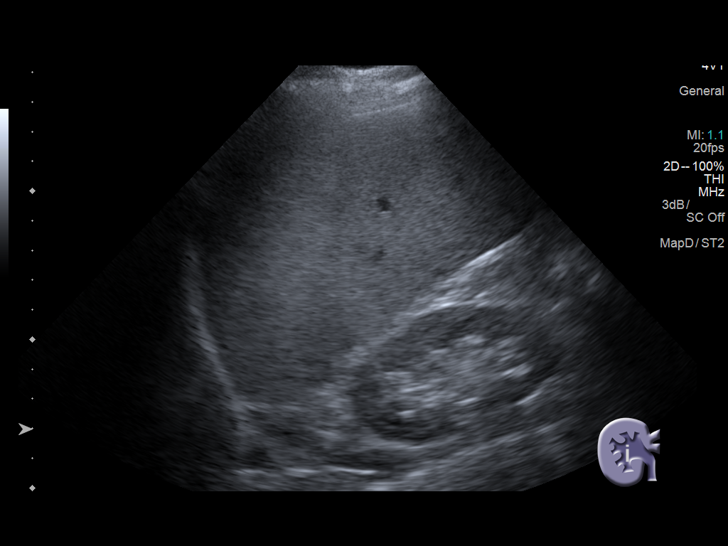
[im 3/35]
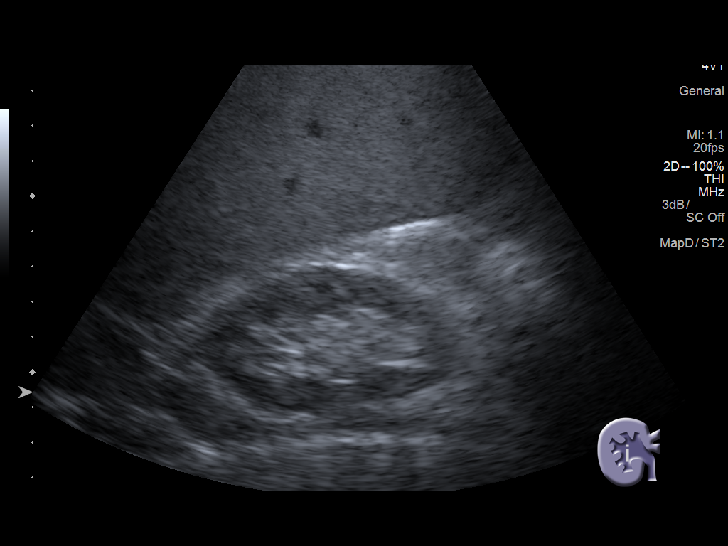
[im 6/35]
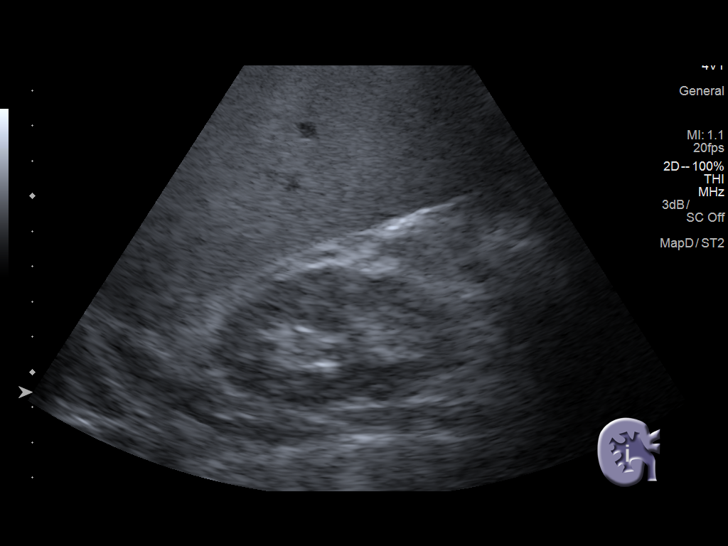
[im 9/35]
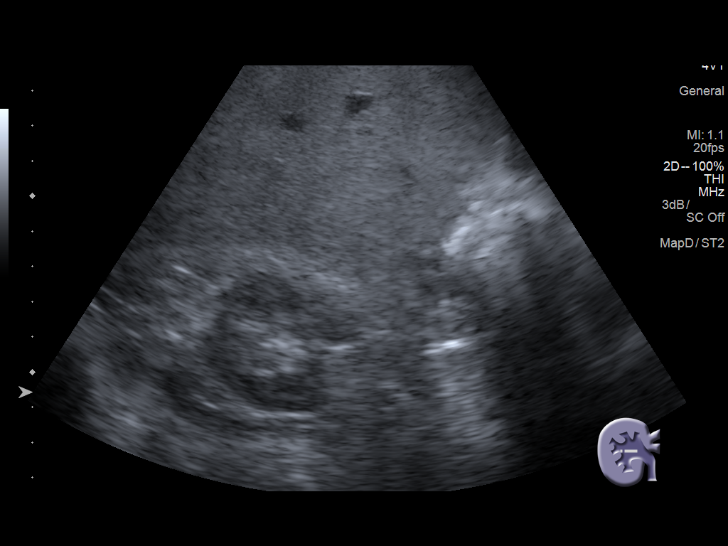
[im 12/35]
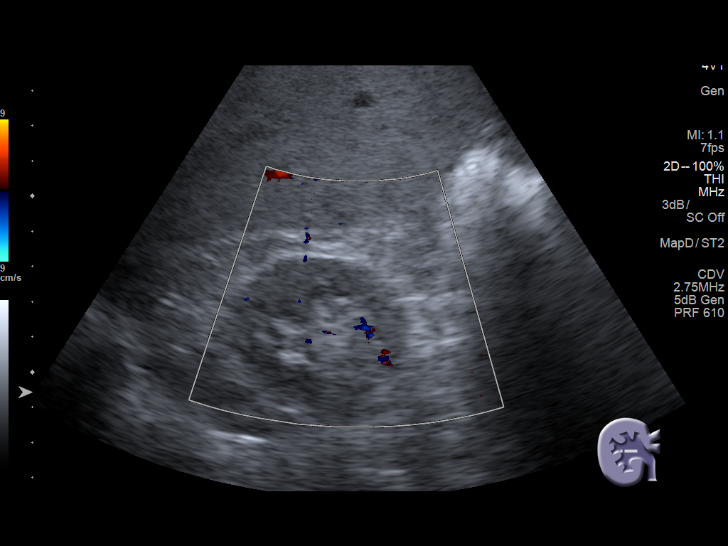
[im 13/35]
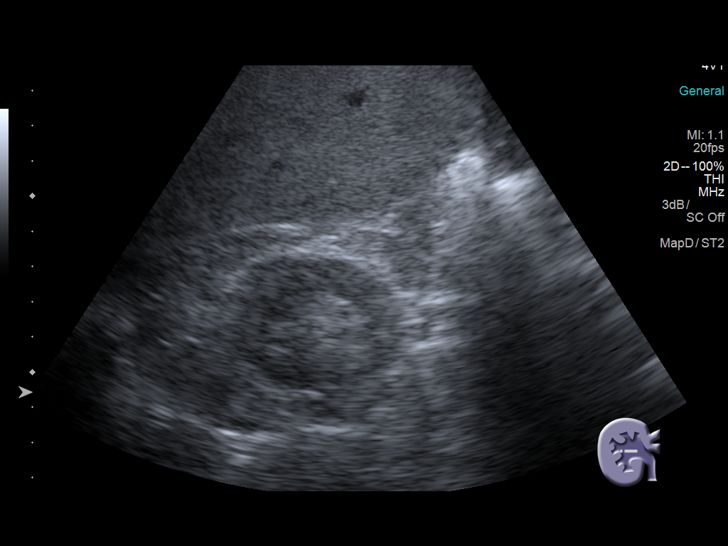
[im 16/35]
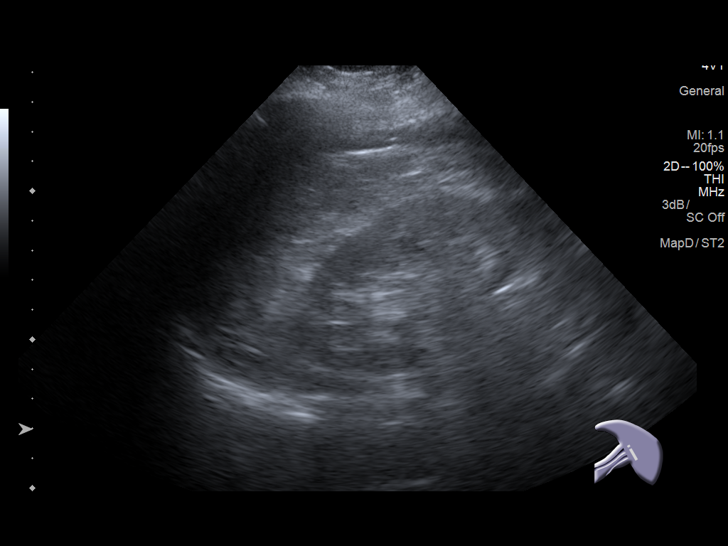
[im 19/35]
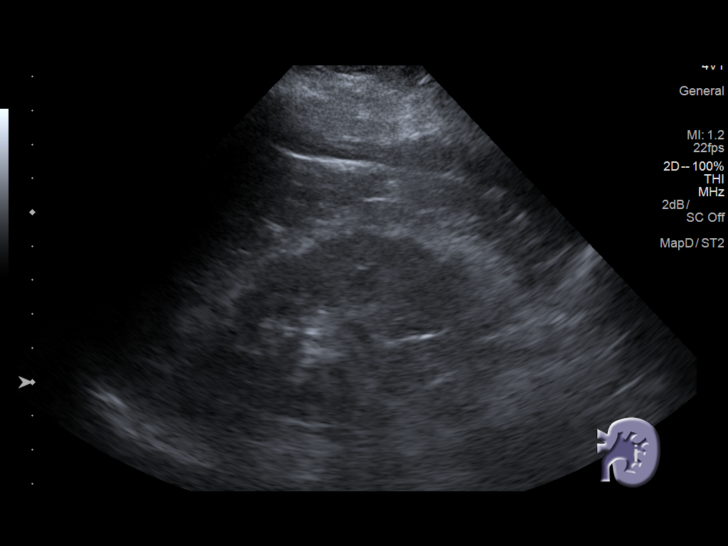
[im 22/35]
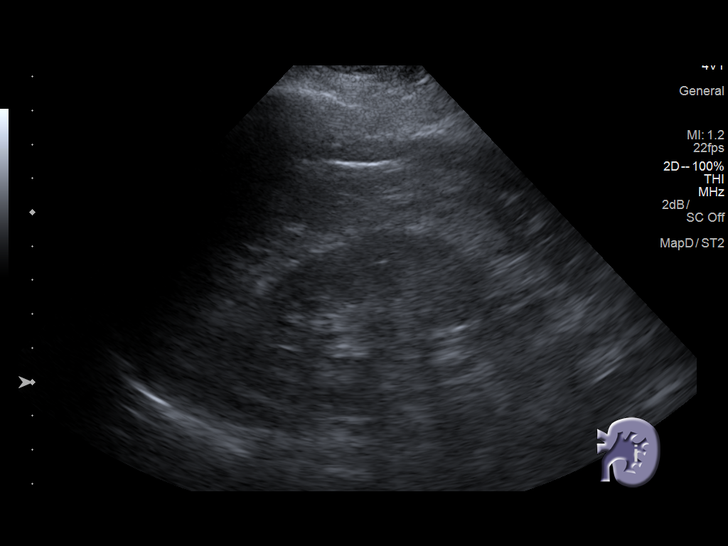
[im 23/35]
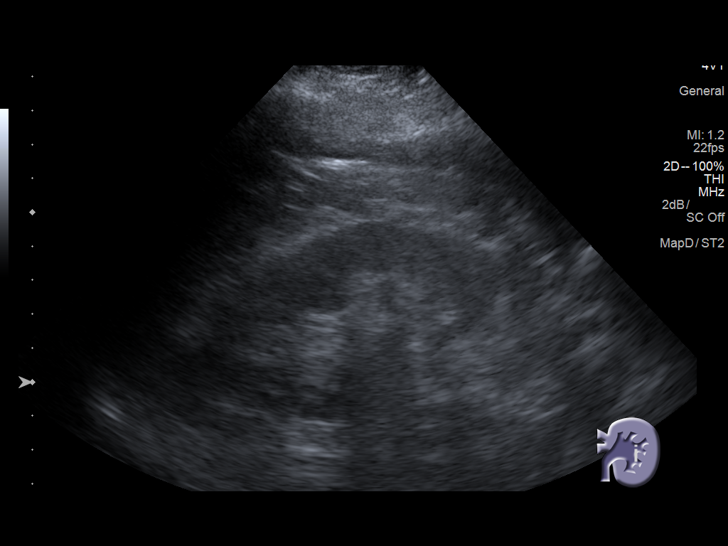
[im 26/35]
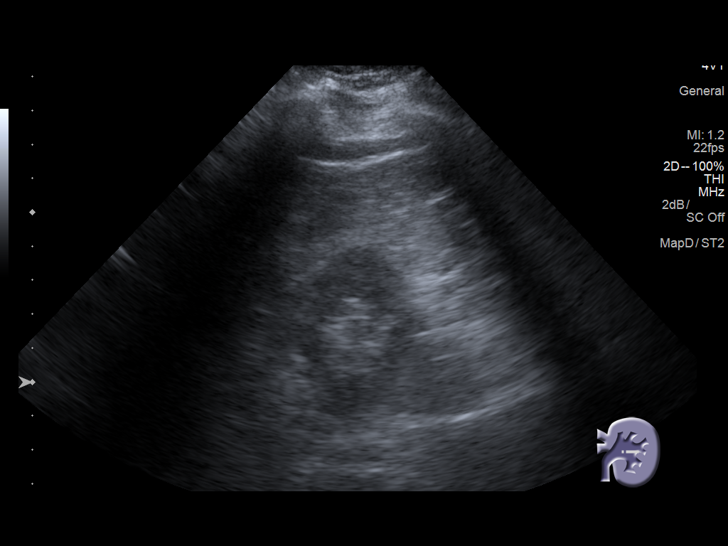
[im 29/35]
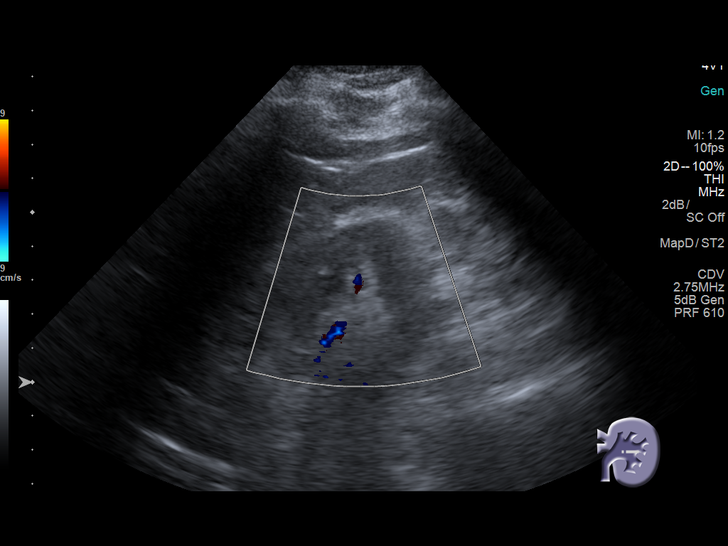
[im 32/35]
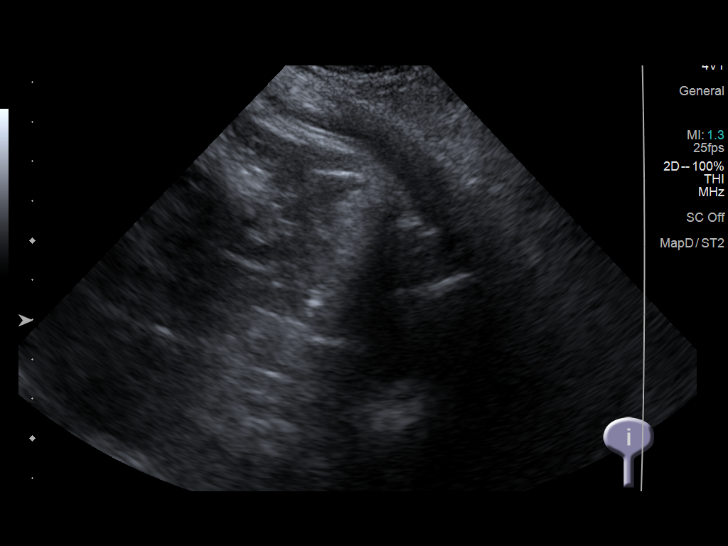
[im 35/35]
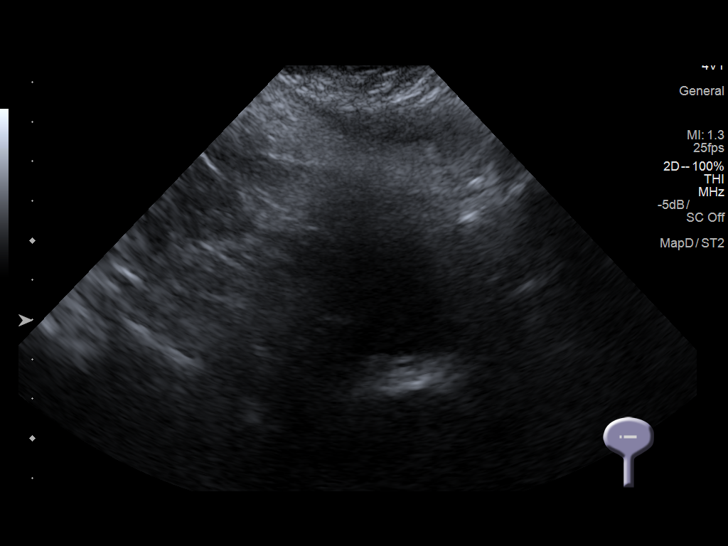

[14 of 25 positions shown; findings below may reference images not displayed]

FINDINGS: Right Kidney:

Length: 7.7 cm. The renal cortical echotexture remains lower than
that of the adjacent liver. There is mild diffuse cortical thinning.
There is no discrete mass or hydronephrosis.

Left Kidney:

Length: 9.3 cm. The cortical echotexture is similar to that on the
right. There is mild cortical thinning diffusely. There is no
hydronephrosis nor cystic or solid mass.

Bladder:

The urinary bladder is nondistended.
IMPRESSION: Bilateral renal atrophy with cortical thinning which has mildly
progressed since the report of the October 30, 2001 ultrasound. The
cortical echotexture remains normal. There is no hydronephrosis.

## 2016-12-31 DIAGNOSIS — I6350 Cerebral infarction due to unspecified occlusion or stenosis of unspecified cerebral artery: Secondary | ICD-10-CM | POA: Diagnosis not present

## 2016-12-31 DIAGNOSIS — J449 Chronic obstructive pulmonary disease, unspecified: Secondary | ICD-10-CM | POA: Diagnosis not present

## 2017-02-13 DIAGNOSIS — G8190 Hemiplegia, unspecified affecting unspecified side: Secondary | ICD-10-CM | POA: Diagnosis not present

## 2017-02-13 DIAGNOSIS — I10 Essential (primary) hypertension: Secondary | ICD-10-CM | POA: Diagnosis not present

## 2017-02-13 DIAGNOSIS — J449 Chronic obstructive pulmonary disease, unspecified: Secondary | ICD-10-CM | POA: Diagnosis not present

## 2017-02-13 DIAGNOSIS — Z23 Encounter for immunization: Secondary | ICD-10-CM | POA: Diagnosis not present

## 2017-02-13 DIAGNOSIS — I6350 Cerebral infarction due to unspecified occlusion or stenosis of unspecified cerebral artery: Secondary | ICD-10-CM | POA: Diagnosis not present

## 2017-03-29 DIAGNOSIS — I10 Essential (primary) hypertension: Secondary | ICD-10-CM | POA: Diagnosis not present

## 2017-03-29 DIAGNOSIS — J449 Chronic obstructive pulmonary disease, unspecified: Secondary | ICD-10-CM | POA: Diagnosis not present

## 2017-04-28 DIAGNOSIS — G8190 Hemiplegia, unspecified affecting unspecified side: Secondary | ICD-10-CM | POA: Diagnosis not present

## 2017-04-28 DIAGNOSIS — I10 Essential (primary) hypertension: Secondary | ICD-10-CM | POA: Diagnosis not present

## 2017-05-29 DIAGNOSIS — I10 Essential (primary) hypertension: Secondary | ICD-10-CM | POA: Diagnosis not present

## 2017-05-29 DIAGNOSIS — G8190 Hemiplegia, unspecified affecting unspecified side: Secondary | ICD-10-CM | POA: Diagnosis not present

## 2017-06-29 DIAGNOSIS — I10 Essential (primary) hypertension: Secondary | ICD-10-CM | POA: Diagnosis not present

## 2017-06-29 DIAGNOSIS — J449 Chronic obstructive pulmonary disease, unspecified: Secondary | ICD-10-CM | POA: Diagnosis not present

## 2017-08-12 DIAGNOSIS — R7302 Impaired glucose tolerance (oral): Secondary | ICD-10-CM | POA: Diagnosis not present

## 2017-08-12 DIAGNOSIS — I6350 Cerebral infarction due to unspecified occlusion or stenosis of unspecified cerebral artery: Secondary | ICD-10-CM | POA: Diagnosis not present

## 2017-08-12 DIAGNOSIS — I10 Essential (primary) hypertension: Secondary | ICD-10-CM | POA: Diagnosis not present

## 2017-08-12 DIAGNOSIS — J449 Chronic obstructive pulmonary disease, unspecified: Secondary | ICD-10-CM | POA: Diagnosis not present

## 2017-08-12 DIAGNOSIS — D731 Hypersplenism: Secondary | ICD-10-CM | POA: Diagnosis not present

## 2017-08-12 DIAGNOSIS — F17200 Nicotine dependence, unspecified, uncomplicated: Secondary | ICD-10-CM | POA: Diagnosis not present

## 2017-08-12 DIAGNOSIS — R569 Unspecified convulsions: Secondary | ICD-10-CM | POA: Diagnosis not present

## 2017-08-12 DIAGNOSIS — G8190 Hemiplegia, unspecified affecting unspecified side: Secondary | ICD-10-CM | POA: Diagnosis not present

## 2017-08-13 DIAGNOSIS — J449 Chronic obstructive pulmonary disease, unspecified: Secondary | ICD-10-CM | POA: Diagnosis not present

## 2017-08-13 DIAGNOSIS — Z1331 Encounter for screening for depression: Secondary | ICD-10-CM | POA: Diagnosis not present

## 2017-08-13 DIAGNOSIS — Z1389 Encounter for screening for other disorder: Secondary | ICD-10-CM | POA: Diagnosis not present

## 2017-08-13 DIAGNOSIS — R7302 Impaired glucose tolerance (oral): Secondary | ICD-10-CM | POA: Diagnosis not present

## 2017-08-13 DIAGNOSIS — I10 Essential (primary) hypertension: Secondary | ICD-10-CM | POA: Diagnosis not present

## 2017-08-13 DIAGNOSIS — F17200 Nicotine dependence, unspecified, uncomplicated: Secondary | ICD-10-CM | POA: Diagnosis not present

## 2017-09-13 DIAGNOSIS — G8190 Hemiplegia, unspecified affecting unspecified side: Secondary | ICD-10-CM | POA: Diagnosis not present

## 2017-09-13 DIAGNOSIS — I10 Essential (primary) hypertension: Secondary | ICD-10-CM | POA: Diagnosis not present

## 2017-10-13 DIAGNOSIS — I6350 Cerebral infarction due to unspecified occlusion or stenosis of unspecified cerebral artery: Secondary | ICD-10-CM | POA: Diagnosis not present

## 2017-10-13 DIAGNOSIS — J449 Chronic obstructive pulmonary disease, unspecified: Secondary | ICD-10-CM | POA: Diagnosis not present

## 2017-11-13 DIAGNOSIS — I10 Essential (primary) hypertension: Secondary | ICD-10-CM | POA: Diagnosis not present

## 2017-11-13 DIAGNOSIS — J449 Chronic obstructive pulmonary disease, unspecified: Secondary | ICD-10-CM | POA: Diagnosis not present

## 2018-01-13 DIAGNOSIS — I6350 Cerebral infarction due to unspecified occlusion or stenosis of unspecified cerebral artery: Secondary | ICD-10-CM | POA: Diagnosis not present

## 2018-01-13 DIAGNOSIS — I10 Essential (primary) hypertension: Secondary | ICD-10-CM | POA: Diagnosis not present

## 2018-02-11 DIAGNOSIS — Z23 Encounter for immunization: Secondary | ICD-10-CM | POA: Diagnosis not present

## 2018-02-11 DIAGNOSIS — F17210 Nicotine dependence, cigarettes, uncomplicated: Secondary | ICD-10-CM | POA: Diagnosis not present

## 2018-02-11 DIAGNOSIS — D731 Hypersplenism: Secondary | ICD-10-CM | POA: Diagnosis not present

## 2018-02-11 DIAGNOSIS — R569 Unspecified convulsions: Secondary | ICD-10-CM | POA: Diagnosis not present

## 2018-02-11 DIAGNOSIS — G8190 Hemiplegia, unspecified affecting unspecified side: Secondary | ICD-10-CM | POA: Diagnosis not present

## 2018-02-11 DIAGNOSIS — J449 Chronic obstructive pulmonary disease, unspecified: Secondary | ICD-10-CM | POA: Diagnosis not present

## 2018-08-12 DIAGNOSIS — F17210 Nicotine dependence, cigarettes, uncomplicated: Secondary | ICD-10-CM | POA: Diagnosis not present

## 2018-08-12 DIAGNOSIS — F17200 Nicotine dependence, unspecified, uncomplicated: Secondary | ICD-10-CM | POA: Diagnosis not present

## 2018-08-12 DIAGNOSIS — R569 Unspecified convulsions: Secondary | ICD-10-CM | POA: Diagnosis not present

## 2018-08-12 DIAGNOSIS — Z1331 Encounter for screening for depression: Secondary | ICD-10-CM | POA: Diagnosis not present

## 2018-08-12 DIAGNOSIS — D731 Hypersplenism: Secondary | ICD-10-CM | POA: Diagnosis not present

## 2018-08-12 DIAGNOSIS — I6350 Cerebral infarction due to unspecified occlusion or stenosis of unspecified cerebral artery: Secondary | ICD-10-CM | POA: Diagnosis not present

## 2018-08-12 DIAGNOSIS — I10 Essential (primary) hypertension: Secondary | ICD-10-CM | POA: Diagnosis not present

## 2018-08-12 DIAGNOSIS — J449 Chronic obstructive pulmonary disease, unspecified: Secondary | ICD-10-CM | POA: Diagnosis not present

## 2018-08-12 DIAGNOSIS — Z1389 Encounter for screening for other disorder: Secondary | ICD-10-CM | POA: Diagnosis not present

## 2018-08-12 DIAGNOSIS — R7302 Impaired glucose tolerance (oral): Secondary | ICD-10-CM | POA: Diagnosis not present

## 2018-08-12 DIAGNOSIS — G8190 Hemiplegia, unspecified affecting unspecified side: Secondary | ICD-10-CM | POA: Diagnosis not present

## 2018-09-14 DIAGNOSIS — I10 Essential (primary) hypertension: Secondary | ICD-10-CM | POA: Diagnosis not present

## 2018-09-14 DIAGNOSIS — N184 Chronic kidney disease, stage 4 (severe): Secondary | ICD-10-CM | POA: Diagnosis not present

## 2018-10-15 DIAGNOSIS — R7302 Impaired glucose tolerance (oral): Secondary | ICD-10-CM | POA: Diagnosis not present

## 2018-10-15 DIAGNOSIS — N184 Chronic kidney disease, stage 4 (severe): Secondary | ICD-10-CM | POA: Diagnosis not present

## 2018-11-15 DIAGNOSIS — I10 Essential (primary) hypertension: Secondary | ICD-10-CM | POA: Diagnosis not present

## 2018-11-15 DIAGNOSIS — N184 Chronic kidney disease, stage 4 (severe): Secondary | ICD-10-CM | POA: Diagnosis not present

## 2018-12-15 DIAGNOSIS — I10 Essential (primary) hypertension: Secondary | ICD-10-CM | POA: Diagnosis not present

## 2018-12-15 DIAGNOSIS — N184 Chronic kidney disease, stage 4 (severe): Secondary | ICD-10-CM | POA: Diagnosis not present

## 2018-12-26 ENCOUNTER — Other Ambulatory Visit: Payer: Self-pay

## 2019-01-15 DIAGNOSIS — N184 Chronic kidney disease, stage 4 (severe): Secondary | ICD-10-CM | POA: Diagnosis not present

## 2019-01-15 DIAGNOSIS — G8190 Hemiplegia, unspecified affecting unspecified side: Secondary | ICD-10-CM | POA: Diagnosis not present

## 2019-02-04 DIAGNOSIS — J449 Chronic obstructive pulmonary disease, unspecified: Secondary | ICD-10-CM | POA: Diagnosis not present

## 2019-02-04 DIAGNOSIS — G8190 Hemiplegia, unspecified affecting unspecified side: Secondary | ICD-10-CM | POA: Diagnosis not present

## 2019-02-04 DIAGNOSIS — N184 Chronic kidney disease, stage 4 (severe): Secondary | ICD-10-CM | POA: Diagnosis not present

## 2019-02-04 DIAGNOSIS — I10 Essential (primary) hypertension: Secondary | ICD-10-CM | POA: Diagnosis not present

## 2019-03-06 DIAGNOSIS — J449 Chronic obstructive pulmonary disease, unspecified: Secondary | ICD-10-CM | POA: Diagnosis not present

## 2019-03-06 DIAGNOSIS — N184 Chronic kidney disease, stage 4 (severe): Secondary | ICD-10-CM | POA: Diagnosis not present

## 2019-06-12 DIAGNOSIS — N184 Chronic kidney disease, stage 4 (severe): Secondary | ICD-10-CM | POA: Diagnosis not present

## 2019-06-12 DIAGNOSIS — I10 Essential (primary) hypertension: Secondary | ICD-10-CM | POA: Diagnosis not present

## 2019-06-12 DIAGNOSIS — J449 Chronic obstructive pulmonary disease, unspecified: Secondary | ICD-10-CM | POA: Diagnosis not present

## 2019-06-12 DIAGNOSIS — G8190 Hemiplegia, unspecified affecting unspecified side: Secondary | ICD-10-CM | POA: Diagnosis not present

## 2019-06-26 DIAGNOSIS — H6123 Impacted cerumen, bilateral: Secondary | ICD-10-CM | POA: Diagnosis not present

## 2019-06-26 DIAGNOSIS — I10 Essential (primary) hypertension: Secondary | ICD-10-CM | POA: Diagnosis not present

## 2019-08-05 ENCOUNTER — Other Ambulatory Visit (HOSPITAL_COMMUNITY): Payer: Self-pay | Admitting: Internal Medicine

## 2019-08-05 ENCOUNTER — Other Ambulatory Visit: Payer: Self-pay | Admitting: Internal Medicine

## 2019-08-05 DIAGNOSIS — G8190 Hemiplegia, unspecified affecting unspecified side: Secondary | ICD-10-CM | POA: Diagnosis not present

## 2019-08-05 DIAGNOSIS — Z1389 Encounter for screening for other disorder: Secondary | ICD-10-CM | POA: Diagnosis not present

## 2019-08-05 DIAGNOSIS — N184 Chronic kidney disease, stage 4 (severe): Secondary | ICD-10-CM | POA: Diagnosis not present

## 2019-08-05 DIAGNOSIS — J449 Chronic obstructive pulmonary disease, unspecified: Secondary | ICD-10-CM | POA: Diagnosis not present

## 2019-08-05 DIAGNOSIS — R569 Unspecified convulsions: Secondary | ICD-10-CM | POA: Diagnosis not present

## 2019-08-05 DIAGNOSIS — Z1331 Encounter for screening for depression: Secondary | ICD-10-CM | POA: Diagnosis not present

## 2019-08-05 DIAGNOSIS — F17210 Nicotine dependence, cigarettes, uncomplicated: Secondary | ICD-10-CM | POA: Diagnosis not present

## 2019-08-12 ENCOUNTER — Other Ambulatory Visit: Payer: Self-pay

## 2019-08-12 ENCOUNTER — Ambulatory Visit (HOSPITAL_COMMUNITY)
Admission: RE | Admit: 2019-08-12 | Discharge: 2019-08-12 | Disposition: A | Discharge status: Home / Self Care | Payer: Medicare Other | Source: Ambulatory Visit | Attending: Internal Medicine | Admitting: Internal Medicine

## 2019-08-12 DIAGNOSIS — N184 Chronic kidney disease, stage 4 (severe): Secondary | ICD-10-CM | POA: Insufficient documentation

## 2019-08-12 DIAGNOSIS — N189 Chronic kidney disease, unspecified: Secondary | ICD-10-CM | POA: Diagnosis not present

## 2019-09-03 DIAGNOSIS — I10 Essential (primary) hypertension: Secondary | ICD-10-CM | POA: Insufficient documentation

## 2019-09-05 DIAGNOSIS — N184 Chronic kidney disease, stage 4 (severe): Secondary | ICD-10-CM | POA: Diagnosis not present

## 2019-09-05 DIAGNOSIS — I10 Essential (primary) hypertension: Secondary | ICD-10-CM | POA: Diagnosis not present

## 2019-10-05 DIAGNOSIS — N184 Chronic kidney disease, stage 4 (severe): Secondary | ICD-10-CM | POA: Diagnosis not present

## 2019-10-05 DIAGNOSIS — I6350 Cerebral infarction due to unspecified occlusion or stenosis of unspecified cerebral artery: Secondary | ICD-10-CM | POA: Diagnosis not present

## 2019-11-05 DIAGNOSIS — R569 Unspecified convulsions: Secondary | ICD-10-CM | POA: Diagnosis not present

## 2019-11-05 DIAGNOSIS — I6350 Cerebral infarction due to unspecified occlusion or stenosis of unspecified cerebral artery: Secondary | ICD-10-CM | POA: Diagnosis not present

## 2020-01-05 DIAGNOSIS — J449 Chronic obstructive pulmonary disease, unspecified: Secondary | ICD-10-CM | POA: Diagnosis not present

## 2020-01-05 DIAGNOSIS — I10 Essential (primary) hypertension: Secondary | ICD-10-CM | POA: Diagnosis not present

## 2020-02-02 DIAGNOSIS — J449 Chronic obstructive pulmonary disease, unspecified: Secondary | ICD-10-CM | POA: Diagnosis not present

## 2020-02-02 DIAGNOSIS — G8190 Hemiplegia, unspecified affecting unspecified side: Secondary | ICD-10-CM | POA: Diagnosis not present

## 2020-02-02 DIAGNOSIS — N184 Chronic kidney disease, stage 4 (severe): Secondary | ICD-10-CM | POA: Diagnosis not present

## 2020-02-02 DIAGNOSIS — I10 Essential (primary) hypertension: Secondary | ICD-10-CM | POA: Diagnosis not present

## 2020-03-03 DIAGNOSIS — I10 Essential (primary) hypertension: Secondary | ICD-10-CM | POA: Diagnosis not present

## 2020-03-03 DIAGNOSIS — G8190 Hemiplegia, unspecified affecting unspecified side: Secondary | ICD-10-CM | POA: Diagnosis not present

## 2020-04-03 DIAGNOSIS — I10 Essential (primary) hypertension: Secondary | ICD-10-CM | POA: Diagnosis not present

## 2020-04-03 DIAGNOSIS — J449 Chronic obstructive pulmonary disease, unspecified: Secondary | ICD-10-CM | POA: Diagnosis not present

## 2020-05-03 DIAGNOSIS — I10 Essential (primary) hypertension: Secondary | ICD-10-CM | POA: Diagnosis not present

## 2020-05-03 DIAGNOSIS — G8190 Hemiplegia, unspecified affecting unspecified side: Secondary | ICD-10-CM | POA: Diagnosis not present

## 2020-06-03 DIAGNOSIS — J449 Chronic obstructive pulmonary disease, unspecified: Secondary | ICD-10-CM | POA: Diagnosis not present

## 2020-06-03 DIAGNOSIS — R569 Unspecified convulsions: Secondary | ICD-10-CM | POA: Diagnosis not present

## 2020-07-01 DIAGNOSIS — I10 Essential (primary) hypertension: Secondary | ICD-10-CM | POA: Diagnosis not present

## 2020-07-01 DIAGNOSIS — R569 Unspecified convulsions: Secondary | ICD-10-CM | POA: Diagnosis not present

## 2020-07-04 DIAGNOSIS — R569 Unspecified convulsions: Secondary | ICD-10-CM | POA: Diagnosis not present

## 2020-07-04 DIAGNOSIS — I10 Essential (primary) hypertension: Secondary | ICD-10-CM | POA: Diagnosis not present

## 2020-08-01 DIAGNOSIS — F17200 Nicotine dependence, unspecified, uncomplicated: Secondary | ICD-10-CM | POA: Diagnosis not present

## 2020-08-01 DIAGNOSIS — I10 Essential (primary) hypertension: Secondary | ICD-10-CM | POA: Diagnosis not present

## 2020-09-01 DIAGNOSIS — I10 Essential (primary) hypertension: Secondary | ICD-10-CM | POA: Diagnosis not present

## 2020-09-01 DIAGNOSIS — J449 Chronic obstructive pulmonary disease, unspecified: Secondary | ICD-10-CM | POA: Diagnosis not present

## 2020-10-05 DIAGNOSIS — N184 Chronic kidney disease, stage 4 (severe): Secondary | ICD-10-CM | POA: Diagnosis not present

## 2020-10-05 DIAGNOSIS — I10 Essential (primary) hypertension: Secondary | ICD-10-CM | POA: Diagnosis not present

## 2020-10-05 DIAGNOSIS — G8190 Hemiplegia, unspecified affecting unspecified side: Secondary | ICD-10-CM | POA: Diagnosis not present

## 2020-10-05 DIAGNOSIS — Z1389 Encounter for screening for other disorder: Secondary | ICD-10-CM | POA: Diagnosis not present

## 2020-10-05 DIAGNOSIS — R569 Unspecified convulsions: Secondary | ICD-10-CM | POA: Diagnosis not present

## 2020-10-05 DIAGNOSIS — Z1331 Encounter for screening for depression: Secondary | ICD-10-CM | POA: Diagnosis not present

## 2020-11-05 DIAGNOSIS — R569 Unspecified convulsions: Secondary | ICD-10-CM | POA: Diagnosis not present

## 2020-11-05 DIAGNOSIS — I10 Essential (primary) hypertension: Secondary | ICD-10-CM | POA: Diagnosis not present

## 2020-11-14 DIAGNOSIS — M79674 Pain in right toe(s): Secondary | ICD-10-CM | POA: Diagnosis not present

## 2020-11-14 DIAGNOSIS — B351 Tinea unguium: Secondary | ICD-10-CM | POA: Diagnosis not present

## 2020-11-14 DIAGNOSIS — M79675 Pain in left toe(s): Secondary | ICD-10-CM | POA: Diagnosis not present

## 2020-11-14 DIAGNOSIS — I739 Peripheral vascular disease, unspecified: Secondary | ICD-10-CM | POA: Diagnosis not present

## 2020-12-05 DIAGNOSIS — J449 Chronic obstructive pulmonary disease, unspecified: Secondary | ICD-10-CM | POA: Diagnosis not present

## 2020-12-05 DIAGNOSIS — I10 Essential (primary) hypertension: Secondary | ICD-10-CM | POA: Diagnosis not present

## 2021-01-05 DIAGNOSIS — R569 Unspecified convulsions: Secondary | ICD-10-CM | POA: Diagnosis not present

## 2021-01-05 DIAGNOSIS — I10 Essential (primary) hypertension: Secondary | ICD-10-CM | POA: Diagnosis not present

## 2021-02-05 DIAGNOSIS — I10 Essential (primary) hypertension: Secondary | ICD-10-CM | POA: Diagnosis not present

## 2021-02-05 DIAGNOSIS — R569 Unspecified convulsions: Secondary | ICD-10-CM | POA: Diagnosis not present

## 2021-03-07 DIAGNOSIS — R569 Unspecified convulsions: Secondary | ICD-10-CM | POA: Diagnosis not present

## 2021-03-07 DIAGNOSIS — I10 Essential (primary) hypertension: Secondary | ICD-10-CM | POA: Diagnosis not present

## 2021-04-07 DIAGNOSIS — R569 Unspecified convulsions: Secondary | ICD-10-CM | POA: Diagnosis not present

## 2021-04-07 DIAGNOSIS — I10 Essential (primary) hypertension: Secondary | ICD-10-CM | POA: Diagnosis not present

## 2021-05-25 ENCOUNTER — Inpatient Hospital Stay (HOSPITAL_COMMUNITY): Payer: Medicare Other

## 2021-05-25 ENCOUNTER — Inpatient Hospital Stay (HOSPITAL_COMMUNITY)
Admission: EM | Admit: 2021-05-25 | Discharge: 2021-05-31 | DRG: 682 | Disposition: A | Discharge status: Skilled Nursing Facility | Payer: Medicare Other | Attending: Family Medicine | Admitting: Family Medicine

## 2021-05-25 ENCOUNTER — Emergency Department (HOSPITAL_COMMUNITY): Payer: Medicare Other

## 2021-05-25 ENCOUNTER — Other Ambulatory Visit: Payer: Self-pay

## 2021-05-25 ENCOUNTER — Encounter (HOSPITAL_COMMUNITY): Payer: Self-pay | Admitting: *Deleted

## 2021-05-25 DIAGNOSIS — Z8673 Personal history of transient ischemic attack (TIA), and cerebral infarction without residual deficits: Secondary | ICD-10-CM

## 2021-05-25 DIAGNOSIS — E860 Dehydration: Secondary | ICD-10-CM | POA: Diagnosis present

## 2021-05-25 DIAGNOSIS — N401 Enlarged prostate with lower urinary tract symptoms: Secondary | ICD-10-CM | POA: Diagnosis present

## 2021-05-25 DIAGNOSIS — J449 Chronic obstructive pulmonary disease, unspecified: Secondary | ICD-10-CM | POA: Diagnosis not present

## 2021-05-25 DIAGNOSIS — Z7982 Long term (current) use of aspirin: Secondary | ICD-10-CM | POA: Diagnosis not present

## 2021-05-25 DIAGNOSIS — E872 Acidosis, unspecified: Secondary | ICD-10-CM | POA: Diagnosis not present

## 2021-05-25 DIAGNOSIS — M6281 Muscle weakness (generalized): Secondary | ICD-10-CM | POA: Diagnosis present

## 2021-05-25 DIAGNOSIS — Z6821 Body mass index (BMI) 21.0-21.9, adult: Secondary | ICD-10-CM

## 2021-05-25 DIAGNOSIS — I129 Hypertensive chronic kidney disease with stage 1 through stage 4 chronic kidney disease, or unspecified chronic kidney disease: Secondary | ICD-10-CM | POA: Diagnosis present

## 2021-05-25 DIAGNOSIS — E43 Unspecified severe protein-calorie malnutrition: Secondary | ICD-10-CM | POA: Diagnosis not present

## 2021-05-25 DIAGNOSIS — G40909 Epilepsy, unspecified, not intractable, without status epilepticus: Secondary | ICD-10-CM | POA: Diagnosis not present

## 2021-05-25 DIAGNOSIS — Z515 Encounter for palliative care: Secondary | ICD-10-CM | POA: Diagnosis not present

## 2021-05-25 DIAGNOSIS — N1832 Chronic kidney disease, stage 3b: Secondary | ICD-10-CM | POA: Diagnosis present

## 2021-05-25 DIAGNOSIS — Z7189 Other specified counseling: Secondary | ICD-10-CM | POA: Diagnosis not present

## 2021-05-25 DIAGNOSIS — R55 Syncope and collapse: Secondary | ICD-10-CM | POA: Diagnosis not present

## 2021-05-25 DIAGNOSIS — N271 Small kidney, bilateral: Secondary | ICD-10-CM | POA: Diagnosis present

## 2021-05-25 DIAGNOSIS — Z7401 Bed confinement status: Secondary | ICD-10-CM | POA: Diagnosis not present

## 2021-05-25 DIAGNOSIS — G9341 Metabolic encephalopathy: Secondary | ICD-10-CM | POA: Diagnosis not present

## 2021-05-25 DIAGNOSIS — I10 Essential (primary) hypertension: Secondary | ICD-10-CM | POA: Diagnosis not present

## 2021-05-25 DIAGNOSIS — Z87891 Personal history of nicotine dependence: Secondary | ICD-10-CM

## 2021-05-25 DIAGNOSIS — R279 Unspecified lack of coordination: Secondary | ICD-10-CM | POA: Diagnosis not present

## 2021-05-25 DIAGNOSIS — J101 Influenza due to other identified influenza virus with other respiratory manifestations: Secondary | ICD-10-CM | POA: Diagnosis not present

## 2021-05-25 DIAGNOSIS — Z741 Need for assistance with personal care: Secondary | ICD-10-CM | POA: Diagnosis present

## 2021-05-25 DIAGNOSIS — Z8674 Personal history of sudden cardiac arrest: Secondary | ICD-10-CM

## 2021-05-25 DIAGNOSIS — R5381 Other malaise: Secondary | ICD-10-CM | POA: Diagnosis not present

## 2021-05-25 DIAGNOSIS — Z20822 Contact with and (suspected) exposure to covid-19: Secondary | ICD-10-CM | POA: Diagnosis not present

## 2021-05-25 DIAGNOSIS — R54 Age-related physical debility: Secondary | ICD-10-CM | POA: Diagnosis not present

## 2021-05-25 DIAGNOSIS — N179 Acute kidney failure, unspecified: Secondary | ICD-10-CM | POA: Diagnosis not present

## 2021-05-25 DIAGNOSIS — R4182 Altered mental status, unspecified: Secondary | ICD-10-CM | POA: Diagnosis not present

## 2021-05-25 DIAGNOSIS — R338 Other retention of urine: Secondary | ICD-10-CM | POA: Diagnosis not present

## 2021-05-25 DIAGNOSIS — D631 Anemia in chronic kidney disease: Secondary | ICD-10-CM | POA: Diagnosis not present

## 2021-05-25 DIAGNOSIS — R2689 Other abnormalities of gait and mobility: Secondary | ICD-10-CM | POA: Diagnosis present

## 2021-05-25 DIAGNOSIS — N390 Urinary tract infection, site not specified: Secondary | ICD-10-CM | POA: Diagnosis present

## 2021-05-25 DIAGNOSIS — Z720 Tobacco use: Secondary | ICD-10-CM | POA: Diagnosis not present

## 2021-05-25 DIAGNOSIS — R531 Weakness: Secondary | ICD-10-CM

## 2021-05-25 DIAGNOSIS — Z79899 Other long term (current) drug therapy: Secondary | ICD-10-CM

## 2021-05-25 DIAGNOSIS — E875 Hyperkalemia: Secondary | ICD-10-CM | POA: Diagnosis present

## 2021-05-25 DIAGNOSIS — N183 Chronic kidney disease, stage 3 unspecified: Secondary | ICD-10-CM | POA: Diagnosis not present

## 2021-05-25 DIAGNOSIS — Z66 Do not resuscitate: Secondary | ICD-10-CM | POA: Diagnosis not present

## 2021-05-25 DIAGNOSIS — Z743 Need for continuous supervision: Secondary | ICD-10-CM | POA: Diagnosis not present

## 2021-05-25 DIAGNOSIS — W19XXXA Unspecified fall, initial encounter: Secondary | ICD-10-CM | POA: Diagnosis not present

## 2021-05-25 DIAGNOSIS — D638 Anemia in other chronic diseases classified elsewhere: Secondary | ICD-10-CM | POA: Diagnosis not present

## 2021-05-25 LAB — COMPREHENSIVE METABOLIC PANEL
ALT: 15 U/L (ref 0–44)
AST: 26 U/L (ref 15–41)
Albumin: 4.7 g/dL (ref 3.5–5.0)
Alkaline Phosphatase: 76 U/L (ref 38–126)
Anion gap: 9 (ref 5–15)
BUN: 115 mg/dL — ABNORMAL HIGH (ref 8–23)
CO2: 13 mmol/L — ABNORMAL LOW (ref 22–32)
Calcium: 10.7 mg/dL — ABNORMAL HIGH (ref 8.9–10.3)
Chloride: 121 mmol/L — ABNORMAL HIGH (ref 98–111)
Creatinine, Ser: 6.91 mg/dL — ABNORMAL HIGH (ref 0.61–1.24)
GFR, Estimated: 7 mL/min — ABNORMAL LOW (ref >60)
Glucose, Bld: 132 mg/dL — ABNORMAL HIGH (ref 70–99)
Potassium: 8.3 mmol/L (ref 3.5–5.1)
Sodium: 143 mmol/L (ref 135–145)
Total Bilirubin: 0.7 mg/dL (ref 0.3–1.2)
Total Protein: 9.1 g/dL — ABNORMAL HIGH (ref 6.5–8.1)

## 2021-05-25 LAB — URINALYSIS, ROUTINE W REFLEX MICROSCOPIC
Bilirubin Urine: NEGATIVE
Glucose, UA: 50 mg/dL — AB
Ketones, ur: NEGATIVE mg/dL
Nitrite: POSITIVE — AB
Protein, ur: 100 mg/dL — AB
Specific Gravity, Urine: 1.012 (ref 1.005–1.030)
WBC, UA: >50 WBC/hpf — ABNORMAL HIGH (ref 0–5)
pH: 5 (ref 5.0–8.0)

## 2021-05-25 LAB — TROPONIN I (HIGH SENSITIVITY)
Troponin I (High Sensitivity): 31 ng/L — ABNORMAL HIGH (ref <18)
Troponin I (High Sensitivity): 30 ng/L — ABNORMAL HIGH (ref <18)

## 2021-05-25 LAB — VITAMIN B12: Vitamin B-12: 471 pg/mL (ref 180–914)

## 2021-05-25 LAB — CBC
HCT: 26.1 % — ABNORMAL LOW (ref 39.0–52.0)
Hemoglobin: 7.6 g/dL — ABNORMAL LOW (ref 13.0–17.0)
MCH: 35 pg — ABNORMAL HIGH (ref 26.0–34.0)
MCHC: 29.1 g/dL — ABNORMAL LOW (ref 30.0–36.0)
MCV: 120.3 fL — ABNORMAL HIGH (ref 80.0–100.0)
Platelets: 178 10*3/uL (ref 150–400)
RBC: 2.17 MIL/uL — ABNORMAL LOW (ref 4.22–5.81)
RDW: 16.8 % — ABNORMAL HIGH (ref 11.5–15.5)
WBC: 8.7 10*3/uL (ref 4.0–10.5)
nRBC: 0.2 % (ref 0.0–0.2)

## 2021-05-25 LAB — IRON AND TIBC
Iron: 52 ug/dL (ref 45–182)
Saturation Ratios: 20 % (ref 17.9–39.5)
TIBC: 263 ug/dL (ref 250–450)
UIBC: 211 ug/dL

## 2021-05-25 LAB — CBC WITH DIFFERENTIAL/PLATELET
Abs Immature Granulocytes: 0.06 10*3/uL (ref 0.00–0.07)
Basophils Absolute: 0 10*3/uL (ref 0.0–0.1)
Basophils Relative: 0 %
Eosinophils Absolute: 0.1 10*3/uL (ref 0.0–0.5)
Eosinophils Relative: 1 %
HCT: 26.6 % — ABNORMAL LOW (ref 39.0–52.0)
Hemoglobin: 8.2 g/dL — ABNORMAL LOW (ref 13.0–17.0)
Immature Granulocytes: 1 %
Lymphocytes Relative: 25 %
Lymphs Abs: 2.6 10*3/uL (ref 0.7–4.0)
MCH: 35.8 pg — ABNORMAL HIGH (ref 26.0–34.0)
MCHC: 30.8 g/dL (ref 30.0–36.0)
MCV: 116.2 fL — ABNORMAL HIGH (ref 80.0–100.0)
Monocytes Absolute: 0.4 10*3/uL (ref 0.1–1.0)
Monocytes Relative: 4 %
Neutro Abs: 7.2 10*3/uL (ref 1.7–7.7)
Neutrophils Relative %: 69 %
Platelets: 190 10*3/uL (ref 150–400)
RBC: 2.29 MIL/uL — ABNORMAL LOW (ref 4.22–5.81)
RDW: 16.8 % — ABNORMAL HIGH (ref 11.5–15.5)
WBC: 10.1 10*3/uL (ref 4.0–10.5)
nRBC: 0.2 % (ref 0.0–0.2)

## 2021-05-25 LAB — FERRITIN: Ferritin: 1619 ng/mL — ABNORMAL HIGH (ref 24–336)

## 2021-05-25 LAB — RENAL FUNCTION PANEL
Albumin: 4.5 g/dL (ref 3.5–5.0)
Anion gap: 14 (ref 5–15)
BUN: 113 mg/dL — ABNORMAL HIGH (ref 8–23)
CO2: 11 mmol/L — ABNORMAL LOW (ref 22–32)
Calcium: 10.7 mg/dL — ABNORMAL HIGH (ref 8.9–10.3)
Chloride: 120 mmol/L — ABNORMAL HIGH (ref 98–111)
Creatinine, Ser: 6.72 mg/dL — ABNORMAL HIGH (ref 0.61–1.24)
GFR, Estimated: 7 mL/min — ABNORMAL LOW (ref >60)
Glucose, Bld: 149 mg/dL — ABNORMAL HIGH (ref 70–99)
Phosphorus: 4.8 mg/dL — ABNORMAL HIGH (ref 2.5–4.6)
Potassium: 6.5 mmol/L (ref 3.5–5.1)
Sodium: 145 mmol/L (ref 135–145)

## 2021-05-25 LAB — BASIC METABOLIC PANEL
Anion gap: 9 (ref 5–15)
BUN: 99 mg/dL — ABNORMAL HIGH (ref 8–23)
CO2: 14 mmol/L — ABNORMAL LOW (ref 22–32)
Calcium: 9.1 mg/dL (ref 8.9–10.3)
Chloride: 119 mmol/L — ABNORMAL HIGH (ref 98–111)
Creatinine, Ser: 5.86 mg/dL — ABNORMAL HIGH (ref 0.61–1.24)
GFR, Estimated: 9 mL/min — ABNORMAL LOW (ref >60)
Glucose, Bld: 116 mg/dL — ABNORMAL HIGH (ref 70–99)
Potassium: 7 mmol/L (ref 3.5–5.1)
Sodium: 142 mmol/L (ref 135–145)

## 2021-05-25 LAB — RESP PANEL BY RT-PCR (FLU A&B, COVID) ARPGX2
Influenza A by PCR: POSITIVE — AB
Influenza B by PCR: NEGATIVE
SARS Coronavirus 2 by RT PCR: NEGATIVE

## 2021-05-25 LAB — RETICULOCYTES
Immature Retic Fract: 17.8 % — ABNORMAL HIGH (ref 2.3–15.9)
RBC.: 2.16 MIL/uL — ABNORMAL LOW (ref 4.22–5.81)
Retic Count, Absolute: 23.1 10*3/uL (ref 19.0–186.0)
Retic Ct Pct: 1.1 % (ref 0.4–3.1)

## 2021-05-25 LAB — HEMOGLOBIN A1C
Hgb A1c MFr Bld: 5.8 % — ABNORMAL HIGH (ref 4.8–5.6)
Mean Plasma Glucose: 119.76 mg/dL

## 2021-05-25 LAB — CREATININE, URINE, RANDOM: Creatinine, Urine: 102.33 mg/dL

## 2021-05-25 LAB — TSH: TSH: 5.137 u[IU]/mL — ABNORMAL HIGH (ref 0.350–4.500)

## 2021-05-25 LAB — CK: Total CK: 242 U/L (ref 49–397)

## 2021-05-25 LAB — SODIUM, URINE, RANDOM: Sodium, Ur: 88 mmol/L

## 2021-05-25 LAB — FOLATE: Folate: 4.7 ng/mL — ABNORMAL LOW (ref >5.9)

## 2021-05-25 LAB — LACTIC ACID, PLASMA: Lactic Acid, Venous: 1.7 mmol/L (ref 0.5–1.9)

## 2021-05-25 MED ORDER — LORAZEPAM 2 MG/ML IJ SOLN: 2.0000 mg | INTRAMUSCULAR | Status: DC | PRN

## 2021-05-25 MED ORDER — HEPARIN SODIUM (PORCINE) 5000 UNIT/ML IJ SOLN: 5000.0000 [IU] | Freq: Three times a day (TID) | INTRAMUSCULAR | Status: DC

## 2021-05-25 MED ORDER — SODIUM CHLORIDE 0.9 % IV BOLUS
1000.0000 mL | Freq: Once | INTRAVENOUS | Status: AC
  Administered 2021-05-25: 14:00:00 1000 mL via INTRAVENOUS

## 2021-05-25 MED ORDER — SODIUM CHLORIDE 0.9 % IV BOLUS
1000.0000 mL | Freq: Once | INTRAVENOUS | Status: AC
  Administered 2021-05-25: 16:00:00 1000 mL via INTRAVENOUS

## 2021-05-25 MED ORDER — DEXTROSE 50 % IV SOLN
1.0000 | Freq: Once | INTRAVENOUS | Status: AC
  Administered 2021-05-25: 15:00:00 50 mL via INTRAVENOUS
  Filled 2021-05-25: qty 50

## 2021-05-25 MED ORDER — INSULIN ASPART 100 UNIT/ML IV SOLN
5.0000 [IU] | Freq: Once | INTRAVENOUS | Status: AC
  Administered 2021-05-25: 23:00:00 5 [IU] via INTRAVENOUS

## 2021-05-25 MED ORDER — INSULIN ASPART 100 UNIT/ML IV SOLN
10.0000 [IU] | Freq: Once | INTRAVENOUS | Status: AC
  Administered 2021-05-25: 15:00:00 10 [IU] via INTRAVENOUS

## 2021-05-25 MED ORDER — OSELTAMIVIR PHOSPHATE 30 MG PO CAPS
30.0000 mg | ORAL_CAPSULE | ORAL | Status: AC
  Administered 2021-05-25 – 2021-05-27 (×2): 30 mg via ORAL
  Filled 2021-05-25 (×3): qty 1

## 2021-05-25 MED ORDER — DEXTROSE 50 % IV SOLN
1.0000 | Freq: Once | INTRAVENOUS | Status: AC
  Administered 2021-05-25: 23:00:00 50 mL via INTRAVENOUS
  Filled 2021-05-25: qty 50

## 2021-05-25 MED ORDER — SODIUM ZIRCONIUM CYCLOSILICATE 5 G PO PACK
10.0000 g | PACK | Freq: Once | ORAL | Status: AC
Start: 2021-05-25 — End: 2021-05-25
  Administered 2021-05-25: 15:00:00 10 g via ORAL
  Filled 2021-05-25: qty 2

## 2021-05-25 MED ORDER — TIOTROPIUM BROMIDE MONOHYDRATE 18 MCG IN CAPS: 18.0000 ug | ORAL_CAPSULE | Freq: Every morning | RESPIRATORY_TRACT | Status: DC

## 2021-05-25 MED ORDER — SODIUM BICARBONATE 8.4 % IV SOLN
INTRAVENOUS | Status: DC
  Filled 2021-05-25 (×12): qty 1000

## 2021-05-25 MED ORDER — FUROSEMIDE 10 MG/ML IJ SOLN
40.0000 mg | Freq: Once | INTRAMUSCULAR | Status: AC
  Administered 2021-05-25: 17:00:00 40 mg via INTRAVENOUS
  Filled 2021-05-25: qty 4

## 2021-05-25 MED ORDER — ASPIRIN 325 MG PO TABS
325.0000 mg | ORAL_TABLET | Freq: Every day | ORAL | Status: DC
  Administered 2021-05-26 – 2021-05-27 (×2): 325 mg via ORAL
  Filled 2021-05-25 (×2): qty 1

## 2021-05-25 MED ORDER — LABETALOL HCL 200 MG PO TABS: 200.0000 mg | ORAL_TABLET | Freq: Two times a day (BID) | ORAL | Status: DC

## 2021-05-25 MED ORDER — ONDANSETRON HCL 4 MG/2ML IJ SOLN: 4.0000 mg | Freq: Four times a day (QID) | INTRAMUSCULAR | Status: DC | PRN

## 2021-05-25 MED ORDER — LABETALOL HCL 200 MG PO TABS
200.0000 mg | ORAL_TABLET | Freq: Two times a day (BID) | ORAL | Status: DC
Start: 2021-05-25 — End: 2021-05-27
  Administered 2021-05-25 – 2021-05-27 (×4): 200 mg via ORAL
  Filled 2021-05-25 (×4): qty 1

## 2021-05-25 MED ORDER — LEVETIRACETAM 500 MG PO TABS
500.0000 mg | ORAL_TABLET | Freq: Two times a day (BID) | ORAL | Status: DC
  Administered 2021-05-25 – 2021-05-31 (×12): 500 mg via ORAL
  Filled 2021-05-25 (×12): qty 1

## 2021-05-25 MED ORDER — ONDANSETRON HCL 4 MG PO TABS: 4.0000 mg | ORAL_TABLET | Freq: Four times a day (QID) | ORAL | Status: DC | PRN

## 2021-05-25 MED ORDER — ACETAMINOPHEN 325 MG PO TABS: 650.0000 mg | ORAL_TABLET | Freq: Four times a day (QID) | ORAL | Status: DC | PRN

## 2021-05-25 MED ORDER — UMECLIDINIUM BROMIDE 62.5 MCG/ACT IN AEPB
1.0000 | INHALATION_SPRAY | Freq: Every day | RESPIRATORY_TRACT | Status: DC
  Administered 2021-05-27: 1 via RESPIRATORY_TRACT
  Filled 2021-05-25 (×2): qty 7

## 2021-05-25 MED ORDER — ACETAMINOPHEN 650 MG RE SUPP: 650.0000 mg | Freq: Four times a day (QID) | RECTAL | Status: DC | PRN

## 2021-05-25 MED ORDER — ALBUTEROL SULFATE HFA 108 (90 BASE) MCG/ACT IN AERS: 2.0000 | INHALATION_SPRAY | Freq: Four times a day (QID) | RESPIRATORY_TRACT | Status: DC | PRN

## 2021-05-25 MED ORDER — HYDRALAZINE HCL 20 MG/ML IJ SOLN: 10.0000 mg | INTRAMUSCULAR | Status: DC | PRN

## 2021-05-25 MED ORDER — SODIUM ZIRCONIUM CYCLOSILICATE 10 G PO PACK
10.0000 g | PACK | Freq: Two times a day (BID) | ORAL | Status: DC
  Administered 2021-05-25: 17:00:00 10 g via ORAL
  Filled 2021-05-25: qty 2

## 2021-05-25 MED ORDER — CALCIUM GLUCONATE-NACL 1-0.675 GM/50ML-% IV SOLN
1.0000 g | Freq: Once | INTRAVENOUS | Status: AC
  Administered 2021-05-25: 15:00:00 1000 mg via INTRAVENOUS
  Filled 2021-05-25: qty 50

## 2021-05-25 MED ORDER — AMLODIPINE BESYLATE 5 MG PO TABS
10.0000 mg | ORAL_TABLET | Freq: Every day | ORAL | Status: DC
  Administered 2021-05-25 – 2021-05-27 (×3): 10 mg via ORAL
  Filled 2021-05-25 (×3): qty 2

## 2021-05-25 NOTE — H&P (Addendum)
History and Physical    Chris Simpson GLO:[id number] DOB: 12-02-1931 DOA: 05/25/2021  PCP: Rosita Fire, MD   Patient coming from: Home  Chief Complaint: Found unresponsive with fall at home  HPI: Chris Simpson is a 87 y.o. male with medical history significant for prior CVA, seizures, hypertension, likely history of CKD, and ongoing tobacco abuse with likely asthma/COPD who presented to the ED after he was found this morning by his home health aide on the floor.  Per the charts, he apparently lives at home independently and has home health aides who visit him on a regular basis.  He apparently slid out of his chair and lost consciousness and ended up waking up on the floor.  It is uncertain whether or not he has had any seizures, but there does not appear to be any tongue biting or incontinence.  He states that he has been compliant with his home medications.  Apparently he has not been eating or drinking very much and has not felt "right."  Home health aide had spoken with the patient's nephew who states that he has not been able to get in touch with the patient for the last couple weeks.  It is difficult to obtain any further history from the patient as his speech is incomprehensible and he appears otherwise confused.   ED Course: Vital signs stable and patient is afebrile.  His hemoglobin is 8.2, his creatinine is 6.91 and potassium is 8.3.  No urine output noted as of yet and Foley is to be placed in the ED.  CT head and chest x-ray with no acute findings.  He has been started on calcium gluconate, Lokelma, D50, insulin, and IV fluid.  EDP has discussed case with nephrology who agrees with current plan for conservative management.  After further discussion with nephrology it has been advised to transfer patient to [facility] due to the severity and complexity of the patient's problem and the potential need for emergent dialysis initiation.  Review of Systems: Cannot be fully obtained given patient  condition.  Past Medical History:  Diagnosis Date   Acute respiratory failure (HCC)    Altered mental status    Cardiac arrest ([place])    CVA (cerebral infarction)    Hypertension    Hypokalemia    Seizures ([place])     History reviewed. No pertinent surgical history.   reports that he quit smoking about 9 years ago. His smoking use included cigarettes. He smoked an average of .3 packs per day. He has never used smokeless tobacco. He reports that he does not drink alcohol and does not use drugs.  No Known Allergies  History reviewed. No pertinent family history.  Prior to Admission medications   Medication Sig Start Date End Date Taking? Authorizing Provider  labetalol (NORMODYNE) 200 MG tablet Take by mouth. 01/09/12  Yes [provider]  levETIRAcetam (KEPPRA) 500 MG tablet Take by mouth. 01/09/12  Yes [provider]  albuterol (PROVENTIL HFA;VENTOLIN HFA) 108 (90 Base) MCG/ACT inhaler Inhale 2 puffs into the lungs every 6 (six) hours as needed for wheezing or shortness of breath. 04/26/16   Rosita Fire, MD  amLODipine (NORVASC) 10 MG tablet Take 10 mg by mouth daily. 04/19/21   [provider]  amLODipine (NORVASC) 5 MG tablet Take 1 tablet (5 mg total) by mouth daily. 02/22/12   Rosita Fire, MD  aspirin 325 MG tablet Take 325 mg by mouth daily.    [provider]  labetalol (NORMODYNE) 200 MG tablet Take 1 tablet (200 mg total) by mouth 2 (two) times daily. 01/09/12 04/22/16  Monika Salk, MD  levETIRAcetam (KEPPRA) 500 MG tablet Take 500 mg by mouth 2 (two) times daily. For seizures 01/09/12 04/22/16  Monika Salk, MD  losartan (COZAAR) 50 MG tablet Take 100 mg by mouth daily. 05/17/21   [provider]  predniSONE (STERAPRED UNI-PAK 21 TAB) 10 MG (21) TBPK tablet 40 mg po daily for 3 days, 30 mg po daily for 3 days, 2 tab po daily for 3 days and 1 tab po daily for 3 days 04/26/16   Rosita Fire, MD  tiotropium (SPIRIVA HANDIHALER) 18  MCG inhalation capsule Place 1 capsule (18 mcg total) into inhaler and inhale every morning. 04/26/16   Rosita Fire, MD    Physical Exam: Vitals:   05/25/21 1229 05/25/21 1300 05/25/21 1400 05/25/21 1416  BP:  (!) 161/102 140/90 (!) 149/90  Pulse:  80  89  Resp: 19 20 20 20   Temp:      TempSrc:      SpO2:  98%  100%  Height:        Constitutional: NAD, calm, comfortable, poor dentition Vitals:   05/25/21 1229 05/25/21 1300 05/25/21 1400 05/25/21 1416  BP:  (!) 161/102 140/90 (!) 149/90  Pulse:  80  89  Resp: 19 20 20 20   Temp:      TempSrc:      SpO2:  98%  100%  Height:       Eyes: lids and conjunctivae normal Neck: normal, supple Respiratory: clear to auscultation bilaterally. Normal respiratory effort. No accessory muscle use.  Cardiovascular: Regular rate and rhythm, no murmurs. Abdomen: no tenderness, no distention. Bowel sounds positive.  Musculoskeletal:  No edema. Skin: no rashes, lesions, ulcers.  Psychiatric: Flat affect  Labs on Admission: I have personally reviewed following labs and imaging studies  CBC: Recent Labs  Lab 05/25/21 1247  WBC 10.1  NEUTROABS 7.2  HGB 8.2*  HCT 26.6*  MCV 116.2*  PLT 409   Basic Metabolic Panel: Recent Labs  Lab 05/25/21 1247  NA 143  K 8.3*  CL 121*  CO2 13*  GLUCOSE 132*  BUN 115*  CREATININE 6.91*  CALCIUM 10.7*   GFR: CrCl cannot be calculated (Unknown ideal weight.). Liver Function Tests: Recent Labs  Lab 05/25/21 1247  AST 26  ALT 15  ALKPHOS 76  BILITOT 0.7  PROT 9.1*  ALBUMIN 4.7   No results for input(s): LIPASE, AMYLASE in the last 168 hours. No results for input(s): AMMONIA in the last 168 hours. Coagulation Profile: No results for input(s): INR, PROTIME in the last 168 hours. Cardiac Enzymes: Recent Labs  Lab 05/25/21 1247  CKTOTAL 242   BNP (last 3 results) No results for input(s): PROBNP in the last 8760 hours. HbA1C: No results for input(s): HGBA1C in the last 72  hours. CBG: No results for input(s): GLUCAP in the last 168 hours. Lipid Profile: No results for input(s): CHOL, HDL, LDLCALC, TRIG, CHOLHDL, LDLDIRECT in the last 72 hours. Thyroid Function Tests: No results for input(s): TSH, T4TOTAL, FREET4, T3FREE, THYROIDAB in the last 72 hours. Anemia Panel: No results for input(s): VITAMINB12, FOLATE, FERRITIN, TIBC, IRON, RETICCTPCT in the last 72 hours. Urine analysis:    Component Value Date/Time   COLORURINE YELLOW 02/20/2012 [place] 02/20/2012 1745   LABSPEC 1.020 02/20/2012 1745   PHURINE 6.5 02/20/2012 1745   GLUCOSEU NEGATIVE 02/20/2012  [place] 02/20/2012 1745   BILIRUBINUR NEGATIVE 02/20/2012 1745   KETONESUR NEGATIVE 02/20/2012 1745   PROTEINUR 30 (A) 02/20/2012 1745   UROBILINOGEN 0.2 02/20/2012 1745   NITRITE NEGATIVE 02/20/2012 1745   LEUKOCYTESUR NEGATIVE 02/20/2012 1745    Radiological Exams on Admission: CT Head Wo Contrast  Result Date: 05/25/2021 CLINICAL DATA:  Altered mental status. EXAM: CT HEAD WITHOUT CONTRAST TECHNIQUE: Contiguous axial images were obtained from the base of the skull through the vertex without intravenous contrast. COMPARISON:  January 04, 2012. FINDINGS: Brain: Mild diffuse cortical atrophy is noted. No mass effect or midline shift is noted. Ventricular size is within normal limits. There is no evidence of mass lesion, hemorrhage or acute infarction. Vascular: No hyperdense vessel or unexpected calcification. Skull: Normal. Negative for fracture or focal lesion. Sinuses/Orbits: No acute finding. Other: None. IMPRESSION: No acute intracranial abnormality seen. Electronically Signed   By: Marijo Conception M.D.   On: 05/25/2021 13:31   DG Chest Port 1 View  Result Date: 05/25/2021 CLINICAL DATA:  Weakness.  Fall. EXAM: PORTABLE CHEST 1 VIEW COMPARISON:  Chest x-ray dated April 22, 2016. FINDINGS: The heart size and mediastinal contours are within normal limits. Both lungs  are clear. Nipple shadow at the right lung base. The visualized skeletal structures are unremarkable. IMPRESSION: No active disease. Electronically Signed   By: Titus Dubin M.D.   On: 05/25/2021 12:17    EKG: Independently reviewed. 84bpm SR.  Assessment/Plan Principal Problem:   Acute metabolic encephalopathy    Acute encephalopathy-multifactorial -Currently appears at baseline -Appears to have had loss of consciousness and uncertain if syncopal episode versus seizure -Work-up for syncope with carotid ultrasound and 2D echocardiogram  -Seizure work-up with EEG, CT head with no acute findings; seizure precautions -Severe hyperkalemia and uremia likely present with advanced CKD  History of prior CVA/seizures -Continue on Keppra -Check EEG as noted above -Continue aspirin  Advanced CKD/AKI -Uncertain baseline as prior creatinine was near 6.7-6.7 in 2094 -Uncertain component of AKI with poor oral intake at home -CK is 242 -Obtain renal ultrasound -Urine electrolytes -Strict intake and output -Avoid nephrotoxic agents -Further evaluation with nephrology pending with potential need for emergent hemodialysis  Severe hyperkalemia secondary to above -Calcium gluconate, IV insulin, D50, sodium bicarbonate, and IV fluids started in ED -EKG without significant changes -Repeat BMP at 1600 per nephrology recommendations -Further recommendations per nephrology  Metabolic acidosis secondary to above -Being given sodium bicarbonate in the ED  Influenza A -Start on Tamiflu with contact precautions -He had noticed decreased appetite recently with poor oral intake likely related to this  Uncontrolled hypertension -Continue home amlodipine and labetalol -IV hydralazine as needed -Hold losartan  Anemia -Likely related to CKD, but is also macrocytic -Obtain anemia panel  Ongoing tobacco abuse -Counseled on cessation  DVT prophylaxis: Heparin Code Status: Full Family  Communication: Tried calling nephew with no response Disposition Plan:Admit to [facility] for Nephrology evaluation/possible HD initiation Consults called:Nephrology-Dr. Carolin Sicks Admission status: Inpatient, Progressive [facility]  Critical care time: 60 minutes.  Chris Fleece D Manuella Ghazi DO [facility]  If 7PM-7AM, please contact night-coverage [email or web address]  05/25/2021, 3:04 PM

## 2021-05-25 NOTE — Progress Notes (Signed)
Patient's foley catheter leaking upon admission to ICU. Foley catheter removed and replaced with a 27fr. foley Catheter.

## 2021-05-25 NOTE — ED Notes (Signed)
Pt BP cuff on lower arm, BP cuff repositioned

## 2021-05-25 NOTE — Progress Notes (Signed)
Patient seen and evaluated at bedside again after nephrology evaluation.  Dr. Carolin Sicks had asked patient about hemodialysis and patient declines wanting any hemodialysis and also states that he is DNR/DNI.  I have gone to bedside and confirmed the same.  Patient will now be DNR/DNI and will remain at [facility].

## 2021-05-25 NOTE — ED Notes (Signed)
The pts nephew Vuong Musa called to provide info on the pt, per the phone call the pt does not have a POA, the pt lives alone and has a sitter that comes a few hours a day, today the sitter contacted the nephew that the pt did not answer the door when she arrived, the sitter had to be let into the house by the building manager and the pt was found on the living room floor, the pts nephew is requesting to be updated at [phone], the pt states, "I just slide out of my chair."

## 2021-05-25 NOTE — ED Notes (Signed)
Attempted foley insertion, 7 F is too large for pt and CN in to assist, called materials for a smaller foley

## 2021-05-25 NOTE — ED Provider Notes (Signed)
[facility] EMERGENCY DEPARTMENT Provider Note  CSN: [id number] Arrival date & time: 05/25/21 1118    History Chief Complaint  Patient presents with   Chris Simpson is a 87 y.o. male with history of seizures, prior stroke with reportedly no residual deficits lives at home independently with home health aids. This morning the aid was unable to get him to answer the door so her supervisor let her in where he was found on the floor He reports he slid out of his chair and woke up on the floor. He does not know if he had a seizure or not. He denies complaints now but reports he was too weak to get off the floor. RN spoke with patient's nephew who reports he hasn't been able to get in touch with the patient for a couple of weeks.    Past Medical History:  Diagnosis Date   Acute respiratory failure (HCC)    Altered mental status    Cardiac arrest ([place])    CVA (cerebral infarction)    Hypertension    Hypokalemia    Seizures ([place])     History reviewed. No pertinent surgical history.  History reviewed. No pertinent family history.  Social History   Tobacco Use   Smoking status: Former    Packs/day: 0.30    Types: Cigarettes    Quit date: 11/26/2011    Years since quitting: 9.5   Smokeless tobacco: Never  Substance Use Topics   Alcohol use: No   Drug use: No     Home Medications Prior to Admission medications   Medication Sig Start Date End Date Taking? Authorizing Provider  labetalol (NORMODYNE) 200 MG tablet Take by mouth. 01/09/12  Yes [provider]  levETIRAcetam (KEPPRA) 500 MG tablet Take by mouth. 01/09/12  Yes [provider]  albuterol (PROVENTIL HFA;VENTOLIN HFA) 108 (90 Base) MCG/ACT inhaler Inhale 2 puffs into the lungs every 6 (six) hours as needed for wheezing or shortness of breath. 04/26/16   Rosita Fire, MD  amLODipine (NORVASC) 10 MG tablet Take 10 mg by mouth daily. 04/19/21   [provider]  amLODipine (NORVASC) 5 MG  tablet Take 1 tablet (5 mg total) by mouth daily. 02/22/12   Rosita Fire, MD  aspirin 325 MG tablet Take 325 mg by mouth daily.    [provider]  labetalol (NORMODYNE) 200 MG tablet Take 1 tablet (200 mg total) by mouth 2 (two) times daily. 01/09/12 04/22/16  Monika Salk, MD  levETIRAcetam (KEPPRA) 500 MG tablet Take 500 mg by mouth 2 (two) times daily. For seizures 01/09/12 04/22/16  Monika Salk, MD  losartan (COZAAR) 50 MG tablet Take 100 mg by mouth daily. 05/17/21   [provider]  predniSONE (STERAPRED UNI-PAK 21 TAB) 10 MG (21) TBPK tablet 40 mg po daily for 3 days, 30 mg po daily for 3 days, 2 tab po daily for 3 days and 1 tab po daily for 3 days 04/26/16   Rosita Fire, MD  tiotropium (SPIRIVA HANDIHALER) 18 MCG inhalation capsule Place 1 capsule (18 mcg total) into inhaler and inhale every morning. 04/26/16   Rosita Fire, MD     Allergies    Patient has no known allergies.   Review of Systems   Review of Systems A comprehensive review of systems was completed and negative except as noted in HPI.    Physical Exam BP (!) 149/90    Pulse 89    Temp  97.6 F (36.4 C) (Oral)    Resp 20    Ht 5\' 8"  (1.727 m)    SpO2 100%    BMI 25.09 kg/m   Physical Exam Vitals and nursing note reviewed.  Constitutional:      Appearance: Normal appearance.  HENT:     Head: Normocephalic and atraumatic.     Nose: Nose normal.     Mouth/Throat:     Mouth: Mucous membranes are dry.  Eyes:     Extraocular Movements: Extraocular movements intact.     Conjunctiva/sclera: Conjunctivae normal.  Cardiovascular:     Rate and Rhythm: Normal rate.  Pulmonary:     Effort: Pulmonary effort is normal.     Breath sounds: Normal breath sounds.  Abdominal:     General: Abdomen is flat.     Palpations: Abdomen is soft.     Tenderness: There is abdominal tenderness (suprapubic).  Musculoskeletal:        General: No swelling. Normal range of motion.     Cervical back: Neck  supple.  Skin:    General: Skin is warm and dry.  Neurological:     General: No focal deficit present.     Mental Status: He is alert.     Comments: Normal BUE strength, mild drift of BLE  Psychiatric:        Mood and Affect: Mood normal.     ED Results / Procedures / Treatments   Labs (all labs ordered are listed, but only abnormal results are displayed) Labs Reviewed  RESP PANEL BY RT-PCR (FLU A&B, COVID) ARPGX2 - Abnormal; Notable for the following components:      Result Value   Influenza A by PCR POSITIVE (*)    All other components within normal limits  COMPREHENSIVE METABOLIC PANEL - Abnormal; Notable for the following components:   Potassium 8.3 (*)    Chloride 121 (*)    CO2 13 (*)    Glucose, Bld 132 (*)    BUN 115 (*)    Creatinine, Ser 6.91 (*)    Calcium 10.7 (*)    Total Protein 9.1 (*)    GFR, Estimated 7 (*)    All other components within normal limits  CBC WITH DIFFERENTIAL/PLATELET - Abnormal; Notable for the following components:   RBC 2.29 (*)    Hemoglobin 8.2 (*)    HCT 26.6 (*)    MCV 116.2 (*)    MCH 35.8 (*)    RDW 16.8 (*)    All other components within normal limits  TROPONIN I (HIGH SENSITIVITY) - Abnormal; Notable for the following components:   Troponin I (High Sensitivity) 31 (*)    All other components within normal limits  TROPONIN I (HIGH SENSITIVITY) - Abnormal; Notable for the following components:   Troponin I (High Sensitivity) 30 (*)    All other components within normal limits  CK  LACTIC ACID, PLASMA  URINALYSIS, ROUTINE W REFLEX MICROSCOPIC    EKG EKG Interpretation  Date/Time:  Thursday May 25 2021 14:05:17 EST Ventricular Rate:  84 PR Interval:  196 QRS Duration: 112 QT Interval:  382 QTC Calculation: 452 R Axis:   52 Text Interpretation: Sinus rhythm Borderline intraventricular conduction delay Borderline repolarization abnormality Since last tracing Non-specific ST-t changes Confirmed by Calvert Cantor  [phone]) on 05/25/2021 2:12:16 PM  Radiology CT Head Wo Contrast  Result Date: 05/25/2021 CLINICAL DATA:  Altered mental status. EXAM: CT HEAD WITHOUT CONTRAST TECHNIQUE: Contiguous axial images were obtained from the  base of the skull through the vertex without intravenous contrast. COMPARISON:  January 04, 2012. FINDINGS: Brain: Mild diffuse cortical atrophy is noted. No mass effect or midline shift is noted. Ventricular size is within normal limits. There is no evidence of mass lesion, hemorrhage or acute infarction. Vascular: No hyperdense vessel or unexpected calcification. Skull: Normal. Negative for fracture or focal lesion. Sinuses/Orbits: No acute finding. Other: None. IMPRESSION: No acute intracranial abnormality seen. Electronically Signed   By: Marijo Conception M.D.   On: 05/25/2021 13:31   DG Chest Port 1 View  Result Date: 05/25/2021 CLINICAL DATA:  Weakness.  Fall. EXAM: PORTABLE CHEST 1 VIEW COMPARISON:  Chest x-ray dated April 22, 2016. FINDINGS: The heart size and mediastinal contours are within normal limits. Both lungs are clear. Nipple shadow at the right lung base. The visualized skeletal structures are unremarkable. IMPRESSION: No active disease. Electronically Signed   By: Titus Dubin M.D.   On: 05/25/2021 12:17    Procedures .Critical Care Performed by: Truddie Hidden, MD Authorized by: Truddie Hidden, MD   Critical care provider statement:    Critical care time (minutes):  75   Critical care time was exclusive of:  Separately billable procedures and treating other patients   Critical care was necessary to treat or prevent imminent or life-threatening deterioration of the following conditions:  Renal failure   Critical care was time spent personally by me on the following activities:  Development of treatment plan with patient or surrogate, discussions with consultants, evaluation of patient's response to treatment, examination of patient, ordering and review  of laboratory studies, ordering and review of radiographic studies, ordering and performing treatments and interventions, pulse oximetry, re-evaluation of patient's condition and review of old charts   Care discussed with: admitting provider    Medications Ordered in the ED Medications  calcium gluconate 1 g/ 50 mL sodium chloride IVPB (has no administration in time range)  sodium bicarbonate 150 mEq in dextrose 5 % 1,150 mL infusion (has no administration in time range)  sodium chloride 0.9 % bolus 1,000 mL (has no administration in time range)  insulin aspart (novoLOG) injection 10 Units (10 Units Intravenous Given 05/25/21 1440)  dextrose 50 % solution 50 mL (50 mLs Intravenous Given 05/25/21 1433)  sodium zirconium cyclosilicate (LOKELMA) packet 10 g (10 g Oral Given 05/25/21 1430)  sodium chloride 0.9 % bolus 1,000 mL (1,000 mLs Intravenous New Bag/Given 05/25/21 1412)     MDM Rules/Calculators/A&P MDM Patient with general weakness, states he slid from chair, but unclear exact mechanism. Could have been a seizure. He is alert and oriented now. Will check labs and imaging. Monitor in the ED.   ED Course  I have reviewed the triage vital signs and the nursing notes.  Pertinent labs & imaging results that were available during my care of the patient were reviewed by me and considered in my medical decision making (see chart for details).  Clinical Course as of 05/25/21 1443  Thu May 25, 2021  1254 CXR is clear.  [CS]  [place] with patient's nephew, [phone], who is a retired [place]. He is the patient's closest kin and he lives in [place]. He states he does the patient's grocery shopping and sees him about every 2 weeks. He has noticed a decline in his functioning recently, no longer getting around well, doesn't always eat the food that is prepared for him and then today with this fall. He is concerned about the patient living  independently moving forward and there is no one else nearby  who can care for him. I let him know we would complete our medical workup, if there is an indication for admission then those concerns would be addressed at discharge. Otherwise we will get our ED [facility] team involved while he is here.  [CS]  8592 CBC shows moderate anemia, not in need of transfusion. No recent labs to compare.  [CS]  1335 CT head is neg for acute process.  [CS]  1359 Lactic acid is negative.  [CS]  73 CMP with AKI and marked hyperkalemia. EKG not done yet, will be expedited. CaGluc, Insulin,  D50 and Lokelma ordered.   [CS]  1403 Begin saline fluid bolus as well.  [CS]  9244 CK is not significantly elevated.  [CS]  6286 Spoke with Dr. Carolin Sicks, Nephrology, who recommends aggressive fluid resuscitation (2-3L NS), agrees with CaGluc, Insulin/D50 and Lokelma. Also recommends bicarb drip and recheck his labs in a few hours. Foley to rule out obstruction and for I/Os. He is OK with admission here. Hospitalist paged.  [CS]  3817 Influenza is positive.  [CS]  8 Spoke with Dr. Manuella Ghazi, Hospitalist, who will evaluate the patient for admission.  [CS]    Clinical Course User Index [CS] Truddie Hidden, MD    Final Clinical Impression(s) / ED Diagnoses Final diagnoses:  AKI (acute kidney injury) ([place])  Hyperkalemia  Influenza A  Generalized weakness    Rx / DC Orders ED Discharge Orders     None        Truddie Hidden, MD 05/25/21 1443

## 2021-05-25 NOTE — Consult Note (Addendum)
[facility] Nephrology Consultation Note  Requesting MD: Dr. Manuella Ghazi, [facility] Reason for consult: Hyperkalemia, AKI  HPI:  Chris Simpson is a 87 y.o. male with history of hypertension, seizure disorder, stroke, CKD, asthma/COPD who was presented to the ER after home health aide found him on the floor, seen as a consultation for the evaluation of hyperkalemia and AKI on CKD.  Reportedly the patient lives at home by himself however has home health aide.  He was not eating or drinking much recently however patient reports taking his medications. For CKD, the creatinine level seems to be around 1.6-2.1 in 2017.  No recent lab results available to compare. In the ER, patient was found to have elevated serum creatinine level of 6.91, BUN 115, potassium level 8.3.  Hemoglobin was 8.2.  EKG with sinus rhythm and unremarkable.  CT scan of head without any acute finding.  Chest x-ray with no active disease. I spoke with ICU physician about hyperkalemia.  I recommended IV fluid bolus, sodium bicarbonate, hyperkalemia treatment, bladder scan, placement of indwelling Foley catheter.  Patient received a liter of bolus and now in the process of getting another bolus.  He already received calcium gluconate, dextrose, Lokelma.  The nurses are trying to place Foley catheter and trying to get smaller Foley. Found to have influenza positive however COVID-negative. The home medications including labetalol, Keppra, amlodipine, losartan and bronchodilators. During my evaluation patient was alert awake and oriented.  I discussed about his conditions including possible dialysis if no improvement in kidney function and potassium with medical management.  Patient was very clear about not wanting dialysis or aggressive measures.  He does not want cardiac resuscitation or mechanical ventilation.  It was verified with ultrasound technician as well as with primary medical attending. He denies headache, dizziness, nausea,  vomiting, chest pain, shortness of breath.  Creatinine, Ser  Date/Time Value Ref Range Status  05/25/2021 12:47 PM 6.91 (H) 0.61 - 1.24 mg/dL Final  04/26/2016 04:52 AM 1.59 (H) 0.61 - 1.24 mg/dL Final  04/25/2016 04:45 AM 1.78 (H) 0.61 - 1.24 mg/dL Final  04/24/2016 04:58 AM 1.92 (H) 0.61 - 1.24 mg/dL Final  04/23/2016 05:10 AM 2.10 (H) 0.61 - 1.24 mg/dL Final  04/22/2016 06:15 AM 1.96 (H) 0.61 - 1.24 mg/dL Final  02/21/2012 12:51 AM 1.35 0.50 - 1.35 mg/dL Final  01/09/2012 07:05 AM 1.82 (H) 0.50 - 1.35 mg/dL Final  01/08/2012 10:10 AM 1.61 (H) 0.50 - 1.35 mg/dL Final  01/07/2012 05:21 AM 1.49 (H) 0.50 - 1.35 mg/dL Final  01/06/2012 02:12 PM 1.48 (H) 0.50 - 1.35 mg/dL Final  01/06/2012 09:08 AM 1.52 (H) 0.50 - 1.35 mg/dL Final  01/05/2012 05:33 AM 1.40 (H) 0.50 - 1.35 mg/dL Final  01/04/2012 08:13 PM 1.35 0.50 - 1.35 mg/dL Final  01/04/2012 03:02 PM 1.48 (H) 0.50 - 1.35 mg/dL Final  01/04/2012 02:58 PM 1.50 (H) 0.50 - 1.35 mg/dL Final     PMHx:   Past Medical History:  Diagnosis Date   Acute respiratory failure ([place])    Altered mental status    Cardiac arrest ([place])    CVA (cerebral infarction)    Hypertension    Hypokalemia    Seizures ([place])     History reviewed. No pertinent surgical history.  Family Hx: History reviewed. No pertinent family history.  Social History:  reports that he quit smoking about 9 years ago. His smoking use included cigarettes. He smoked an average of .3 packs per day. He has never used  smokeless tobacco. He reports that he does not drink alcohol and does not use drugs.  Allergies: No Known Allergies  Medications: Prior to Admission medications   Medication Sig Start Date End Date Taking? Authorizing Provider  labetalol (NORMODYNE) 200 MG tablet Take by mouth. 01/09/12  Yes [provider]  levETIRAcetam (KEPPRA) 500 MG tablet Take by mouth. 01/09/12  Yes [provider]  albuterol (PROVENTIL HFA;VENTOLIN HFA) 108 (90 Base)  MCG/ACT inhaler Inhale 2 puffs into the lungs every 6 (six) hours as needed for wheezing or shortness of breath. 04/26/16   Rosita Fire, MD  amLODipine (NORVASC) 10 MG tablet Take 10 mg by mouth daily. 04/19/21   [provider]  amLODipine (NORVASC) 5 MG tablet Take 1 tablet (5 mg total) by mouth daily. 02/22/12   Rosita Fire, MD  aspirin 325 MG tablet Take 325 mg by mouth daily.    [provider]  labetalol (NORMODYNE) 200 MG tablet Take 1 tablet (200 mg total) by mouth 2 (two) times daily. 01/09/12 04/22/16  Monika Salk, MD  levETIRAcetam (KEPPRA) 500 MG tablet Take 500 mg by mouth 2 (two) times daily. For seizures 01/09/12 04/22/16  Monika Salk, MD  losartan (COZAAR) 50 MG tablet Take 100 mg by mouth daily. 05/17/21   [provider]  predniSONE (STERAPRED UNI-PAK 21 TAB) 10 MG (21) TBPK tablet 40 mg po daily for 3 days, 30 mg po daily for 3 days, 2 tab po daily for 3 days and 1 tab po daily for 3 days 04/26/16   Rosita Fire, MD  tiotropium (SPIRIVA HANDIHALER) 18 MCG inhalation capsule Place 1 capsule (18 mcg total) into inhaler and inhale every morning. 04/26/16   Rosita Fire, MD    I have reviewed the patient's current medications.  Labs:  Results for orders placed or performed during the hospital encounter of 05/25/21 (from the past 48 hour(s))  Resp Panel by RT-PCR (Flu A&B, Covid) Nasopharyngeal Swab     Status: Abnormal   Collection Time: 05/25/21 12:26 PM   Specimen: Nasopharyngeal Swab; Nasopharyngeal(NP) swabs in vial transport medium  Result Value Ref Range   SARS Coronavirus 2 by RT PCR NEGATIVE NEGATIVE    Comment: (NOTE) SARS-CoV-2 target nucleic acids are NOT DETECTED.  The SARS-CoV-2 RNA is generally detectable in upper respiratory specimens during the acute phase of infection. The lowest concentration of SARS-CoV-2 viral copies this assay can detect is 138 copies/mL. A negative result does not preclude SARS-Cov-2 infection and  should not be used as the sole basis for treatment or other patient management decisions. A negative result may occur with  improper specimen collection/handling, submission of specimen other than nasopharyngeal swab, presence of viral mutation(s) within the areas targeted by this assay, and inadequate number of viral copies(<138 copies/mL). A negative result must be combined with clinical observations, patient history, and epidemiological information. The expected result is Negative.  Fact Sheet for Patients:  EntrepreneurPulse.com.au  Fact Sheet for Healthcare Providers:  IncredibleEmployment.be  This test is no t yet approved or cleared by the [place] FDA and  has been authorized for detection and/or diagnosis of SARS-CoV-2 by FDA under an Emergency Use Authorization (EUA). This EUA will remain  in effect (meaning this test can be used) for the duration of the COVID-19 declaration under Section 564(b)(1) of the Act, 21 U.S.C.section 360bbb-3(b)(1), unless the authorization is terminated  or revoked sooner.       Influenza A by PCR POSITIVE (A) NEGATIVE  Influenza B by PCR NEGATIVE NEGATIVE    Comment: (NOTE) The Xpert Xpress SARS-CoV-2/FLU/RSV plus assay is intended as an aid in the diagnosis of influenza from Nasopharyngeal swab specimens and should not be used as a sole basis for treatment. Nasal washings and aspirates are unacceptable for Xpert Xpress SARS-CoV-2/FLU/RSV testing.  Fact Sheet for Patients: EntrepreneurPulse.com.au  Fact Sheet for Healthcare Providers: IncredibleEmployment.be  This test is not yet approved or cleared by the [place] FDA and has been authorized for detection and/or diagnosis of SARS-CoV-2 by FDA under an Emergency Use Authorization (EUA). This EUA will remain in effect (meaning this test can be used) for the duration of the COVID-19 declaration under  Section 564(b)(1) of the Act, 21 U.S.C. section 360bbb-3(b)(1), unless the authorization is terminated or revoked.  Performed at [facility], [address] [place], [place] [address]   Comprehensive metabolic panel     Status: Abnormal   Collection Time: 05/25/21 12:47 PM  Result Value Ref Range   Sodium 143 135 - 145 mmol/L   Potassium 8.3 (HH) 3.5 - 5.1 mmol/L    Comment: NO VISIBLE HEMOLYSIS CRITICAL RESULT CALLED TO, READ BACK BY AND VERIFIED WITH: H STENTE RN 1400 [id number] K FORSYTH    Chloride 121 (H) 98 - 111 mmol/L   CO2 13 (L) 22 - 32 mmol/L   Glucose, Bld 132 (H) 70 - 99 mg/dL    Comment: Glucose reference range applies only to samples taken after fasting for at least 8 hours.   BUN 115 (H) 8 - 23 mg/dL    Comment: RESULTS CONFIRMED BY MANUAL DILUTION   Creatinine, Ser 6.91 (H) 0.61 - 1.24 mg/dL   Calcium 10.7 (H) 8.9 - 10.3 mg/dL   Total Protein 9.1 (H) 6.5 - 8.1 g/dL   Albumin 4.7 3.5 - 5.0 g/dL   AST 26 15 - 41 U/L   ALT 15 0 - 44 U/L   Alkaline Phosphatase 76 38 - 126 U/L   Total Bilirubin 0.7 0.3 - 1.2 mg/dL   GFR, Estimated 7 (L) >60 mL/min    Comment: (NOTE) Calculated using the CKD-EPI Creatinine Equation (2021)    Anion gap 9 5 - 15    Comment: Performed at [facility], [address] [place], [place] [address]  CBC with Differential     Status: Abnormal   Collection Time: 05/25/21 12:47 PM  Result Value Ref Range   WBC 10.1 4.0 - 10.5 K/uL   RBC 2.29 (L) 4.22 - 5.81 MIL/uL   Hemoglobin 8.2 (L) 13.0 - 17.0 g/dL   HCT 26.6 (L) 39.0 - 52.0 %   MCV 116.2 (H) 80.0 - 100.0 fL   MCH 35.8 (H) 26.0 - 34.0 pg   MCHC 30.8 30.0 - 36.0 g/dL   RDW 16.8 (H) 11.5 - 15.5 %   Platelets 190 150 - 400 K/uL   nRBC 0.2 0.0 - 0.2 %   Neutrophils Relative % 69 %   Neutro Abs 7.2 1.7 - 7.7 K/uL   Lymphocytes Relative 25 %   Lymphs Abs 2.6 0.7 - 4.0 K/uL   Monocytes Relative 4 %   Monocytes Absolute 0.4 0.1 - 1.0 K/uL   Eosinophils Relative 1 %   Eosinophils Absolute  0.1 0.0 - 0.5 K/uL   Basophils Relative 0 %   Basophils Absolute 0.0 0.0 - 0.1 K/uL   Immature Granulocytes 1 %   Abs Immature Granulocytes 0.06 0.00 - 0.07 K/uL    Comment: Performed at [facility], [address], [place], [place] [address]  Troponin I (High Sensitivity)     Status: Abnormal   Collection Time: 05/25/21 12:47 PM  Result Value Ref Range   Troponin I (High Sensitivity) 31 (H) <18 ng/L    Comment: (NOTE) Elevated high sensitivity troponin I (hsTnI) values and significant  changes across serial measurements may suggest ACS but many other  chronic and acute conditions are known to elevate hsTnI results.  Refer to the Links section for chest pain algorithms and additional  guidance. Performed at [facility], [address], [place], [place] [address]   CK     Status: None   Collection Time: 05/25/21 12:47 PM  Result Value Ref Range   Total CK 242 49 - 397 U/L    Comment: Performed at [facility], [address] [place], [place] [address]  Lactic acid, plasma     Status: None   Collection Time: 05/25/21  1:28 PM  Result Value Ref Range   Lactic Acid, Venous 1.7 0.5 - 1.9 mmol/L    Comment: Performed at [facility], [address] [place], [place] [address]  Troponin I (High Sensitivity)     Status: Abnormal   Collection Time: 05/25/21  1:53 PM  Result Value Ref Range   Troponin I (High Sensitivity) 30 (H) <18 ng/L    Comment: (NOTE) Elevated high sensitivity troponin I (hsTnI) values and significant  changes across serial measurements may suggest ACS but many other  chronic and acute conditions are known to elevate hsTnI results.  Refer to the "Links" section for chest pain algorithms and additional  guidance. Performed at [facility], [address] [place], [place] [address]   CBC     Status: Abnormal   Collection Time: 05/25/21  3:42 PM  Result Value Ref Range   WBC 8.7 4.0 - 10.5 K/uL   RBC 2.17 (L) 4.22 - 5.81 MIL/uL   Hemoglobin 7.6 (L) 13.0 - 17.0  g/dL   HCT 26.1 (L) 39.0 - 52.0 %   MCV 120.3 (H) 80.0 - 100.0 fL   MCH 35.0 (H) 26.0 - 34.0 pg   MCHC 29.1 (L) 30.0 - 36.0 g/dL   RDW 16.8 (H) 11.5 - 15.5 %   Platelets 178 150 - 400 K/uL   nRBC 0.2 0.0 - 0.2 %    Comment: Performed at [facility], [address], [place], [place] [address]  Reticulocytes     Status: Abnormal   Collection Time: 05/25/21  3:42 PM  Result Value Ref Range   Retic Ct Pct 1.1 0.4 - 3.1 %   RBC. 2.16 (L) 4.22 - 5.81 MIL/uL   Retic Count, Absolute 23.1 19.0 - 186.0 K/uL   Immature Retic Fract 17.8 (H) 2.3 - 15.9 %    Comment: Performed at [facility], [address], [place], [place] [address]     ROS:  Pertinent items noted in HPI and remainder of comprehensive ROS otherwise negative.  Physical Exam: Vitals:   05/25/21 1500 05/25/21 1600  BP: (!) 175/97 (!) 192/60  Pulse:  (!) 102  Resp: (!) 23 (!) 34  Temp:    SpO2:  100%     General exam: Elderly looking male lying on bed comfortable, not in distress Respiratory system: Clear to auscultation. Respiratory effort normal. No wheezing or crackle Cardiovascular system: S1 & S2 heard, RRR.  No pedal edema. Gastrointestinal system: Abdomen is nondistended, soft and nontender. Normal bowel sounds heard. Central nervous system: Alert and  oriented. No focal neurological deficits. Extremities: Symmetric 5 x 5 power. Skin: No rashes, lesions or ulcers Psychiatry: Judgement and insight appear normal. Mood & affect appropriate.   Assessment/Plan:  #Severe hyperkalemia without EKG changes in the setting of AKI and use of losartan.  Patient looks severely dehydrated therefore receiving IV fluid boluses.  He already medical management of hyperkalemia including IVF, Lokelma, insulin, dextrose.  The repeat potassium level improved to 6.5 from 8.3.  I will increase IV fluid rate 150 cc/hr and order a dose of Lasix for renal excretion of potassium.  Continue sodium bicarbonate, Lokelma and repeat lab around 8  PM. Patient is clear about not wanting dialysis therefore no plan for dialysis as discussed below.  #Acute kidney injury on CKD stage III versus progressive CKD: The recent baseline serum creatinine level unknown, he had elevated creatinine level up to 2.1 in 2017.  The AKI likely hemodynamically mediated in the setting of severe dehydration related with influenza concomitant with losartan use.   I have discussed about goals of care with the patient.  He is very clear about not wanting dialysis.  He also does not want cardiac resuscitation or mechanical ventilation.  It was verified in the presence of primary medical team Dr. Manuella Ghazi.  We will continue to manage medically and consult palliative care service.  Check UA, bladder scan, insert Foley catheter, check kidney ultrasound, continue IV fluid.  Continue strict ins and out.  #Metabolic acidosis in the setting of AKI: Continue IV sodium bicarbonate.  Monitor CO2 level.  #Anemia due to chronic disease: I will check iron studies.  #Influenza A: Starting Tamiflu.  He is not on respiratory distress.  #Hypertension: Continue amlodipine and labetalol.  Hold losartan given AKI and hyperkalemia.  I have discussed with the ER physician, ER nurse, primary medical team. Thank you for the consult, I will follow with you.   Brentlee Delage Tanna Furry 05/25/2021, 4:23 PM  [facility].

## 2021-05-25 NOTE — ED Triage Notes (Addendum)
Pt lives alone and a lady (per report)  went to check on pt and found him in the floor.  Pt states he was getting out of the chair and fell early this morning per pt. Landed on his knees and denies hitting his head. Pt alert and oriented to month and holiday.

## 2021-05-25 NOTE — Progress Notes (Addendum)
RN called due to patient having potassium level 7.0.  Chart was reviewed and it was noted that k+ was 8.3 on admission, days decreased to 6.5 after being treated with calcium gluconate, IV insulin, D50, sodium bicarbonate, and IV fluids started in ED. Nephrology was already consulted and made some recommendations regarding treatment.  Patient does not want dialysis per nephrologist's medical record and this was confirmed by the day hospitalist. EKG was done and shows sinus tachycardia at a rate of 105 bpm IV insulin and dextrose will be given at this time, Chris Simpson will be repeated in the morning, we shall continue to monitor potassium level and continue to monitor on telemetry.  Total time:  13 minutes This includes time reviewing the chart including progress notes, consult notes, labs, EKGs, taking medical decisions and documenting findings.

## 2021-05-25 NOTE — ED Notes (Signed)
Patient transported to CT 

## 2021-05-26 ENCOUNTER — Inpatient Hospital Stay (HOSPITAL_COMMUNITY): Payer: Medicare Other

## 2021-05-26 ENCOUNTER — Inpatient Hospital Stay (HOSPITAL_COMMUNITY)
Admit: 2021-05-26 | Discharge: 2021-05-26 | Disposition: A | Discharge status: Home / Self Care | Payer: Medicare Other | Attending: Internal Medicine | Admitting: Internal Medicine

## 2021-05-26 DIAGNOSIS — R55 Syncope and collapse: Secondary | ICD-10-CM | POA: Diagnosis not present

## 2021-05-26 LAB — ECHOCARDIOGRAM COMPLETE
AR max vel: 2 cm2
AV Area VTI: 1.91 cm2
AV Area mean vel: 1.64 cm2
AV Mean grad: 3 mmHg
AV Peak grad: 6 mmHg
Ao pk vel: 1.22 m/s
Area-P 1/2: 2.63 cm2
Calc EF: 62.9 %
Height: 68 in
MV VTI: 2.13 cm2
S' Lateral: 2.7 cm
Single Plane A2C EF: 60.4 %
Single Plane A4C EF: 62 %
Weight: 2229.29 oz

## 2021-05-26 LAB — BASIC METABOLIC PANEL WITH GFR
Anion gap: 12 (ref 5–15)
BUN: 91 mg/dL — ABNORMAL HIGH (ref 8–23)
CO2: 23 mmol/L (ref 22–32)
Calcium: 8.5 mg/dL — ABNORMAL LOW (ref 8.9–10.3)
Chloride: 107 mmol/L (ref 98–111)
Creatinine, Ser: 5.38 mg/dL — ABNORMAL HIGH (ref 0.61–1.24)
GFR, Estimated: 10 mL/min — ABNORMAL LOW
Glucose, Bld: 172 mg/dL — ABNORMAL HIGH (ref 70–99)
Potassium: 5.2 mmol/L — ABNORMAL HIGH (ref 3.5–5.1)
Sodium: 142 mmol/L (ref 135–145)

## 2021-05-26 LAB — RENAL FUNCTION PANEL
Albumin: 3.6 g/dL (ref 3.5–5.0)
Anion gap: 11 (ref 5–15)
BUN: 97 mg/dL — ABNORMAL HIGH (ref 8–23)
CO2: 18 mmol/L — ABNORMAL LOW (ref 22–32)
Calcium: 9.1 mg/dL (ref 8.9–10.3)
Chloride: 114 mmol/L — ABNORMAL HIGH (ref 98–111)
Creatinine, Ser: 5.45 mg/dL — ABNORMAL HIGH (ref 0.61–1.24)
GFR, Estimated: 10 mL/min — ABNORMAL LOW (ref >60)
Glucose, Bld: 141 mg/dL — ABNORMAL HIGH (ref 70–99)
Phosphorus: 3.8 mg/dL (ref 2.5–4.6)
Potassium: 6.1 mmol/L — ABNORMAL HIGH (ref 3.5–5.1)
Sodium: 143 mmol/L (ref 135–145)

## 2021-05-26 LAB — MRSA NEXT GEN BY PCR, NASAL: MRSA by PCR Next Gen: NOT DETECTED

## 2021-05-26 LAB — CBC
HCT: 20.8 % — ABNORMAL LOW (ref 39.0–52.0)
HCT: 19.9 % — ABNORMAL LOW (ref 39.0–52.0)
Hemoglobin: 6.7 g/dL — CL (ref 13.0–17.0)
Hemoglobin: 6.6 g/dL — CL (ref 13.0–17.0)
MCH: 36 pg — ABNORMAL HIGH (ref 26.0–34.0)
MCH: 35.9 pg — ABNORMAL HIGH (ref 26.0–34.0)
MCHC: 32.2 g/dL (ref 30.0–36.0)
MCHC: 33.2 g/dL (ref 30.0–36.0)
MCV: 111.8 fL — ABNORMAL HIGH (ref 80.0–100.0)
MCV: 108.2 fL — ABNORMAL HIGH (ref 80.0–100.0)
Platelets: 149 10*3/uL — ABNORMAL LOW (ref 150–400)
Platelets: 141 10*3/uL — ABNORMAL LOW (ref 150–400)
RBC: 1.86 MIL/uL — ABNORMAL LOW (ref 4.22–5.81)
RBC: 1.84 MIL/uL — ABNORMAL LOW (ref 4.22–5.81)
RDW: 16.3 % — ABNORMAL HIGH (ref 11.5–15.5)
RDW: 16.3 % — ABNORMAL HIGH (ref 11.5–15.5)
WBC: 6 10*3/uL (ref 4.0–10.5)
WBC: 5.6 10*3/uL (ref 4.0–10.5)
nRBC: 0.5 % — ABNORMAL HIGH (ref 0.0–0.2)
nRBC: 0.4 % — ABNORMAL HIGH (ref 0.0–0.2)

## 2021-05-26 LAB — T4, FREE: Free T4: 0.9 ng/dL (ref 0.61–1.12)

## 2021-05-26 LAB — POTASSIUM: Potassium: 6.1 mmol/L — ABNORMAL HIGH (ref 3.5–5.1)

## 2021-05-26 LAB — PREPARE RBC (CROSSMATCH)

## 2021-05-26 LAB — MAGNESIUM: Magnesium: 1.5 mg/dL — ABNORMAL LOW (ref 1.7–2.4)

## 2021-05-26 LAB — ABO/RH: ABO/RH(D): O POS

## 2021-05-26 MED ORDER — SODIUM CHLORIDE 0.9 % IV SOLN
INTRAVENOUS | Status: DC
Start: 2021-05-26 — End: 2021-05-27

## 2021-05-26 MED ORDER — NEPRO/CARBSTEADY PO LIQD
237.0000 mL | Freq: Three times a day (TID) | ORAL | Status: DC
  Administered 2021-05-26 – 2021-05-31 (×13): 237 mL via ORAL

## 2021-05-26 MED ORDER — ORAL CARE MOUTH RINSE
15.0000 mL | Freq: Two times a day (BID) | OROMUCOSAL | Status: DC
  Administered 2021-05-26 – 2021-05-27 (×4): 15 mL via OROMUCOSAL

## 2021-05-26 MED ORDER — DARBEPOETIN ALFA 40 MCG/0.4ML IJ SOSY
40.0000 ug | PREFILLED_SYRINGE | Freq: Once | INTRAMUSCULAR | Status: AC
  Administered 2021-05-26: 13:00:00 40 ug via SUBCUTANEOUS
  Filled 2021-05-26: qty 0.4

## 2021-05-26 MED ORDER — SODIUM CHLORIDE 0.9 % IV SOLN
1.0000 g | INTRAVENOUS | Status: DC
  Administered 2021-05-26 – 2021-05-27 (×2): 1 g via INTRAVENOUS
  Filled 2021-05-26 (×2): qty 10

## 2021-05-26 MED ORDER — FUROSEMIDE 10 MG/ML IJ SOLN
40.0000 mg | Freq: Once | INTRAMUSCULAR | Status: AC
  Administered 2021-05-26: 10:00:00 40 mg via INTRAVENOUS
  Filled 2021-05-26: qty 4

## 2021-05-26 MED ORDER — SODIUM CHLORIDE 0.9% IV SOLUTION: Freq: Once | INTRAVENOUS | Status: DC

## 2021-05-26 MED ORDER — SODIUM ZIRCONIUM CYCLOSILICATE 10 G PO PACK
10.0000 g | PACK | Freq: Four times a day (QID) | ORAL | Status: AC
  Administered 2021-05-26 (×3): 10 g via ORAL
  Filled 2021-05-26 (×3): qty 1

## 2021-05-26 MED ORDER — CHLORHEXIDINE GLUCONATE CLOTH 2 % EX PADS
6.0000 | MEDICATED_PAD | Freq: Every day | CUTANEOUS | Status: DC
  Administered 2021-05-26 – 2021-05-31 (×6): 6 via TOPICAL

## 2021-05-26 MED ORDER — RENA-VITE PO TABS
1.0000 | ORAL_TABLET | Freq: Every day | ORAL | Status: DC
  Administered 2021-05-26: 21:00:00 1 via ORAL
  Filled 2021-05-26: qty 1

## 2021-05-26 NOTE — Progress Notes (Signed)
[facility] NEPHROLOGY PROGRESS NOTE  Assessment/ Plan: Pt is a 87 y.o. yo male   with history of hypertension, seizure disorder, stroke, CKD, asthma/COPD who was presented to the ER after home health aide found him on the floor, seen as a consultation for the evaluation of hyperkalemia (K 8.3) and AKI on CKD.  # Severe hyperkalemia without EKG changes in the setting of AKI and use of losartan.  The potassium level was 8.3 on admission which was improved to 6.1 after medical management including IV fluid, bicarbonate, insulin, dextrose, Lokelma, Lasix. I will continue IV fluid and order a dose of Lasix today to increase renal excretion of potassium.  Continue Lokelma and lab monitoring.    #Acute kidney injury on CKD stage III versus progressive CKD: The recent baseline serum creatinine level unknown, he had elevated creatinine level up to 2.1 in 2017.  The AKI likely hemodynamically mediated in the setting of severe dehydration related with influenza concomitant with losartan use.   I have discussed about goals of care with the patient on admission.  He is very clear about not wanting dialysis.  He also does not want cardiac resuscitation or mechanical ventilation.  It was verified in the presence of primary medical team Dr. Manuella Ghazi.  We will continue to manage medically and consulted palliative care service.   Urinalysis with some protein and possibly UTI.  US renal with a small kidneys without any acute finding.  Continue with strict ins and out and daily lab.     #Metabolic acidosis in the setting of AKI: Continue IV sodium bicarbonate.  Monitor CO2 level.   #Anemia due to chronic disease: Hemoglobin dropped to 6.6 today probably because of dilution.  No sign of bleeding.  Plan for a unit of blood transfusion however patient is refusing per nursing staff.  Iron saturation is 20 with very high ferritin level.  I will order a dose of Aranesp.   #Influenza A: Starting Tamiflu.  He is  not on respiratory distress.   #Hypertension: Continue amlodipine and labetalol.  Hold losartan given AKI and hyperkalemia.  Discussed with the primary team.  Please call our team over the weekend with any question.  Subjective: Seen and examined in ICU.  Denies nausea, vomiting, chest pain, shortness of breath.  Urine output is recorded on 1 L.  No new event. Objective Vital signs in last 24 hours: Vitals:   05/26/21 0600 05/26/21 0620 05/26/21 0700 05/26/21 0800  BP:  127/77 (!) 162/66 (!) 151/77  Pulse: 81 73 81 86  Resp: 19 18 (!) 24 (!) 24  Temp:   97.7 F (36.5 C)   TempSrc:   Oral   SpO2: 99% 99% 98% 96%  Weight:      Height:       Weight change:   Intake/Output Summary (Last 24 hours) at 05/26/2021 0852 Last data filed at 05/26/2021 0433 Gross per 24 hour  Intake 3179.48 ml  Output 1000 ml  Net 2179.48 ml       Labs: Basic Metabolic Panel: Recent Labs  Lab 05/25/21 1544 05/25/21 2018 05/26/21 0421 05/26/21 0520  NA 145 142 143  --   K 6.5* 7.0* 6.1* 6.1*  CL 120* 119* 114*  --   CO2 11* 14* 18*  --   GLUCOSE 149* 116* 141*  --   BUN 113* 99* 97*  --   CREATININE 6.72* 5.86* 5.45*  --   CALCIUM 10.7* 9.1 9.1  --   PHOS  4.8*  --  3.8  --    Liver Function Tests: Recent Labs  Lab 05/25/21 1247 05/25/21 1544 05/26/21 0421  AST 26  --   --   ALT 15  --   --   ALKPHOS 76  --   --   BILITOT 0.7  --   --   PROT 9.1*  --   --   ALBUMIN 4.7 4.5 3.6   No results for input(s): LIPASE, AMYLASE in the last 168 hours. No results for input(s): AMMONIA in the last 168 hours. CBC: Recent Labs  Lab 05/25/21 1247 05/25/21 1542 05/26/21 0421 05/26/21 0729  WBC 10.1 8.7 6.0 5.6  NEUTROABS 7.2  --   --   --   HGB 8.2* 7.6* 6.7* 6.6*  HCT 26.6* 26.1* 20.8* 19.9*  MCV 116.2* 120.3* 111.8* 108.2*  PLT 190 178 149* 141*   Cardiac Enzymes: Recent Labs  Lab 05/25/21 1247  CKTOTAL 242   CBG: No results for input(s): GLUCAP in the last 168  hours.  Iron Studies:  Recent Labs    05/25/21 1542  IRON 52  TIBC 263  FERRITIN 1,619*   Studies/Results: CT Head Wo Contrast  Result Date: 05/25/2021 CLINICAL DATA:  Altered mental status. EXAM: CT HEAD WITHOUT CONTRAST TECHNIQUE: Contiguous axial images were obtained from the base of the skull through the vertex without intravenous contrast. COMPARISON:  January 04, 2012. FINDINGS: Brain: Mild diffuse cortical atrophy is noted. No mass effect or midline shift is noted. Ventricular size is within normal limits. There is no evidence of mass lesion, hemorrhage or acute infarction. Vascular: No hyperdense vessel or unexpected calcification. Skull: Normal. Negative for fracture or focal lesion. Sinuses/Orbits: No acute finding. Other: None. IMPRESSION: No acute intracranial abnormality seen. Electronically Signed   By: Marijo Conception M.D.   On: 05/25/2021 13:31   US RENAL  Result Date: 05/25/2021 CLINICAL DATA:  Acute kidney injury. EXAM: RENAL / URINARY TRACT ULTRASOUND COMPLETE COMPARISON:  Renal ultrasound 08/12/2019 FINDINGS: Right Kidney: Renal measurements: 6.8 x 3.4 x 4.5 cm = volume: 59 mL. Echogenicity within normal limits. No mass or hydronephrosis visualized. Left Kidney: Renal measurements: 8.2 x 4.4 x 4.4 cm = volume: 82 mL. 1.3 cm cyst left midpole. Echogenicity within normal limits. No mass or hydronephrosis visualized. Bladder: Bladder volume 48 mL. No bladder mass. Ureteral jets not visualized Other: None IMPRESSION: Small volume kidneys bilaterally without acute abnormality. Electronically Signed   By: Franchot Gallo M.D.   On: 05/25/2021 16:53   US Carotid Bilateral  Result Date: 05/25/2021 CLINICAL DATA:  Altered mental status Syncope Hypertension Carotid stent History of tobacco use EXAM: BILATERAL CAROTID DUPLEX ULTRASOUND TECHNIQUE: Pearline Cables scale imaging, color Doppler and duplex ultrasound were performed of bilateral carotid and vertebral arteries in the neck. COMPARISON:   None. FINDINGS: Criteria: Quantification of carotid stenosis is based on velocity parameters that correlate the residual internal carotid diameter with NASCET-based stenosis levels, using the diameter of the distal internal carotid lumen as the denominator for stenosis measurement. The following velocity measurements were obtained: RIGHT ICA: 97/19 cm/sec CCA: 40/10 cm/sec SYSTOLIC ICA/CCA RATIO:  1.0 ECA: 116 cm/sec LEFT ICA: 116/38 cm/sec CCA: 27/25 cm/sec SYSTOLIC ICA/CCA RATIO:  1.6 ECA: 79 cm/sec RIGHT CAROTID ARTERY: Diffuse intimal thickening of the right common carotid artery. No significant atheromatous plaque identified in the visualized right ICA. RIGHT VERTEBRAL ARTERY:  Antegrade flow. LEFT CAROTID ARTERY: Minimal heterogeneous plaque of the carotid bifurcation. Mild intimal thickening noted in  the proximal left internal carotid artery. LEFT VERTEBRAL ARTERY:  Antegrade flow. IMPRESSION: Less than 50% stenosis of the internal carotid arteries. Electronically Signed   By: Miachel Roux M.D.   On: 05/25/2021 17:08   DG Chest Port 1 View  Result Date: 05/25/2021 CLINICAL DATA:  Weakness.  Fall. EXAM: PORTABLE CHEST 1 VIEW COMPARISON:  Chest x-ray dated April 22, 2016. FINDINGS: The heart size and mediastinal contours are within normal limits. Both lungs are clear. Nipple shadow at the right lung base. The visualized skeletal structures are unremarkable. IMPRESSION: No active disease. Electronically Signed   By: Titus Dubin M.D.   On: 05/25/2021 12:17    Medications: Infusions:  cefTRIAXone (ROCEPHIN)  IV     sodium bicarbonate 150 mEq in D5W infusion 150 mL/hr at 05/26/21 0230    Scheduled Medications:  sodium chloride   Intravenous Once   amLODipine  10 mg Oral Daily   aspirin  325 mg Oral Daily   Chlorhexidine Gluconate Cloth  6 each Topical Daily   labetalol  200 mg Oral BID   levETIRAcetam  500 mg Oral BID   mouth rinse  15 mL Mouth Rinse BID   oseltamivir  30 mg Oral QODAY    sodium zirconium cyclosilicate  10 g Oral A5B   umeclidinium bromide  1 puff Inhalation Daily    have reviewed scheduled and prn medications.  Physical Exam: General:NAD, comfortable Heart:RRR, s1s2 nl Lungs:clear b/l, no crackle Abdomen:soft, Non-tender, non-distended Extremities:No edema Neurology: Alert, awake and following commands  Toniesha Zellner Tanna Furry 05/26/2021,8:52 AM  LOS: 1 day

## 2021-05-26 NOTE — Progress Notes (Signed)
*  PRELIMINARY RESULTS* Echocardiogram 2D Echocardiogram has been performed.  Chris Simpson 05/26/2021, 11:52 AM

## 2021-05-26 NOTE — Progress Notes (Signed)
PROGRESS NOTE    Chris Simpson  QQP:[id number] DOB: 07-13-1931 DOA: 05/25/2021 PCP: Rosita Fire, MD   Brief Narrative:   Chris Simpson is a 87 y.o. male with medical history significant for prior CVA, seizures, hypertension, likely history of CKD, and ongoing tobacco abuse with likely asthma/COPD who presented to the ED after he was found this morning by his home health aide on the floor.  Per the charts, he apparently lives at home independently and has home health aides who visit him on a regular basis.  He apparently slid out of his chair and lost consciousness and ended up waking up on the floor.  Patient was admitted with acute encephalopathy of uncertain origin.  He was noted to have significant metabolic disturbances with severe hyperkalemia and metabolic acidosis related to what appears to be AKI and advanced CKD.  He is also noted to have influenza A.  Assessment & Plan:   Principal Problem:   Acute metabolic encephalopathy   Acute encephalopathy-multifactorial -Currently appears at baseline -Appears to have had loss of consciousness and uncertain if syncopal episode versus seizure -Carotid ultrasound with no acute findings and 2D echocardiogram pending -TSH 5.137 and free T4 ordered -Seizure work-up with EEG demonstrating some right-sided cortical dysfunction, but no seizure activity, CT head with no acute findings; seizure precautions -Severe hyperkalemia improving -UTI may be causing some of the issues  UTI -Appears to be related to some urinary retention issues and likely BPH -Obtain urine culture -Start Rocephin 12/30  Worsening anemia likely due to chronic disease -Anemia panel without any signs of iron deficiency -Patient refuses 1 unit PRBC transfusion -Aranesp ordered per nephrology -Continue to monitor -No overt bleeding noted   History of prior CVA/seizures -Continue on Keppra -Check EEG with no findings of seizure activity -Continue aspirin   AKI on CKD  stage IIIb -Uncertain baseline as prior creatinine was near 7.1-2.4 in 5809 -Uncertain component of AKI with poor oral intake at home -Continue IV fluid and Lasix as ordered -CK is 242 -Renal ultrasound with no acute findings -Nephrology following and patient refuses hemodialysis   Severe hyperkalemia secondary to AKI and losartan use-improving -Calcium gluconate, IV insulin, D50, sodium bicarbonate, and IV fluids started in ED -Continue Lasix and IV fluid -EKG without significant changes -Repeat BMP at 1600 per nephrology recommendations -Further recommendations per nephrology   Metabolic acidosis secondary to above -Continue sodium bicarbonate and monitor   Influenza A -Start on Tamiflu with contact precautions -He had noticed decreased appetite recently with poor oral intake likely related to this   Uncontrolled hypertension -Continue home amlodipine and labetalol -IV hydralazine as needed -Hold losartan   Ongoing tobacco abuse -Counseled on cessation   DVT prophylaxis: Heparin to SCDs Code Status: DNR Family Communication: Discussed with Nephew on phone [phone] Disposition Plan:  Status is: Inpatient  Remains inpatient appropriate because: Requires ongoing IV medications.  Consultants:  Nephrology  Procedures:  See below  Antimicrobials:  Rocephin 12/30>>   Subjective: Patient seen and evaluated today with no new acute complaints or concerns. No acute concerns or events noted overnight. He refuses blood this a.m.  Objective: Vitals:   05/26/21 0800 05/26/21 0900 05/26/21 1000 05/26/21 1100  BP: (!) 151/77 (!) 162/72 (!) 145/99   Pulse: 86 83 88   Resp: (!) 24 (!) 26 18   Temp:    97.8 F (36.6 C)  TempSrc:    Oral  SpO2: 96% 99% 97%   Weight:  Height:        Intake/Output Summary (Last 24 hours) at 05/26/2021 1127 Last data filed at 05/26/2021 1009 Gross per 24 hour  Intake 3299.48 ml  Output 1450 ml  Net 1849.48 ml   Filed Weights    05/25/21 1600 05/25/21 2104 05/26/21 0433  Weight: 74.8 kg 63.7 kg 63.2 kg    Examination:  General exam: Appears calm and comfortable  Respiratory system: Clear to auscultation. Respiratory effort normal. Cardiovascular system: S1 & S2 heard, RRR.  Gastrointestinal system: Abdomen is soft Central nervous system: Alert and awake Extremities: No edema Skin: No significant lesions noted Psychiatry: Flat affect.    Data Reviewed: I have personally reviewed following labs and imaging studies  CBC: Recent Labs  Lab 05/25/21 1247 05/25/21 1542 05/26/21 0421 05/26/21 0729  WBC 10.1 8.7 6.0 5.6  NEUTROABS 7.2  --   --   --   HGB 8.2* 7.6* 6.7* 6.6*  HCT 26.6* 26.1* 20.8* 19.9*  MCV 116.2* 120.3* 111.8* 108.2*  PLT 190 178 149* 203*   Basic Metabolic Panel: Recent Labs  Lab 05/25/21 1247 05/25/21 1544 05/25/21 2018 05/26/21 0421 05/26/21 0520  NA 143 145 142 143  --   K 8.3* 6.5* 7.0* 6.1* 6.1*  CL 121* 120* 119* 114*  --   CO2 13* 11* 14* 18*  --   GLUCOSE 132* 149* 116* 141*  --   BUN 115* 113* 99* 97*  --   CREATININE 6.91* 6.72* 5.86* 5.45*  --   CALCIUM 10.7* 10.7* 9.1 9.1  --   MG  --   --   --  1.5*  --   PHOS  --  4.8*  --  3.8  --    GFR: Estimated Creatinine Clearance: 8.5 mL/min (A) (by C-G formula based on SCr of 5.45 mg/dL (H)). Liver Function Tests: Recent Labs  Lab 05/25/21 1247 05/25/21 1544 05/26/21 0421  AST 26  --   --   ALT 15  --   --   ALKPHOS 76  --   --   BILITOT 0.7  --   --   PROT 9.1*  --   --   ALBUMIN 4.7 4.5 3.6   No results for input(s): LIPASE, AMYLASE in the last 168 hours. No results for input(s): AMMONIA in the last 168 hours. Coagulation Profile: No results for input(s): INR, PROTIME in the last 168 hours. Cardiac Enzymes: Recent Labs  Lab 05/25/21 1247  CKTOTAL 242   BNP (last 3 results) No results for input(s): PROBNP in the last 8760 hours. HbA1C: Recent Labs    05/25/21 1542  HGBA1C 5.8*   CBG: No  results for input(s): GLUCAP in the last 168 hours. Lipid Profile: No results for input(s): CHOL, HDL, LDLCALC, TRIG, CHOLHDL, LDLDIRECT in the last 72 hours. Thyroid Function Tests: Recent Labs    05/25/21 1542  TSH 5.137*   Anemia Panel: Recent Labs    05/25/21 1542  VITAMINB12 471  FOLATE 4.7*  FERRITIN 1,619*  TIBC 263  IRON 52  RETICCTPCT 1.1   Sepsis Labs: Recent Labs  Lab 05/25/21 1328  LATICACIDVEN 1.7    Recent Results (from the past 240 hour(s))  Resp Panel by RT-PCR (Flu A&B, Covid) Nasopharyngeal Swab     Status: Abnormal   Collection Time: 05/25/21 12:26 PM   Specimen: Nasopharyngeal Swab; Nasopharyngeal(NP) swabs in vial transport medium  Result Value Ref Range Status   SARS Coronavirus 2 by RT PCR NEGATIVE NEGATIVE Final  Comment: (NOTE) SARS-CoV-2 target nucleic acids are NOT DETECTED.  The SARS-CoV-2 RNA is generally detectable in upper respiratory specimens during the acute phase of infection. The lowest concentration of SARS-CoV-2 viral copies this assay can detect is 138 copies/mL. A negative result does not preclude SARS-Cov-2 infection and should not be used as the sole basis for treatment or other patient management decisions. A negative result may occur with  improper specimen collection/handling, submission of specimen other than nasopharyngeal swab, presence of viral mutation(s) within the areas targeted by this assay, and inadequate number of viral copies(<138 copies/mL). A negative result must be combined with clinical observations, patient history, and epidemiological information. The expected result is Negative.  Fact Sheet for Patients:  EntrepreneurPulse.com.au  Fact Sheet for Healthcare Providers:  IncredibleEmployment.be  This test is no t yet approved or cleared by the [place] FDA and  has been authorized for detection and/or diagnosis of SARS-CoV-2 by FDA under an Emergency Use  Authorization (EUA). This EUA will remain  in effect (meaning this test can be used) for the duration of the COVID-19 declaration under Section 564(b)(1) of the Act, 21 U.S.C.section 360bbb-3(b)(1), unless the authorization is terminated  or revoked sooner.       Influenza A by PCR POSITIVE (A) NEGATIVE Final   Influenza B by PCR NEGATIVE NEGATIVE Final    Comment: (NOTE) The Xpert Xpress SARS-CoV-2/FLU/RSV plus assay is intended as an aid in the diagnosis of influenza from Nasopharyngeal swab specimens and should not be used as a sole basis for treatment. Nasal washings and aspirates are unacceptable for Xpert Xpress SARS-CoV-2/FLU/RSV testing.  Fact Sheet for Patients: EntrepreneurPulse.com.au  Fact Sheet for Healthcare Providers: IncredibleEmployment.be  This test is not yet approved or cleared by the [place] FDA and has been authorized for detection and/or diagnosis of SARS-CoV-2 by FDA under an Emergency Use Authorization (EUA). This EUA will remain in effect (meaning this test can be used) for the duration of the COVID-19 declaration under Section 564(b)(1) of the Act, 21 U.S.C. section 360bbb-3(b)(1), unless the authorization is terminated or revoked.  Performed at [facility], [address], [place], [place] [address]          Radiology Studies: CT Head Wo Contrast  Result Date: 05/25/2021 CLINICAL DATA:  Altered mental status. EXAM: CT HEAD WITHOUT CONTRAST TECHNIQUE: Contiguous axial images were obtained from the base of the skull through the vertex without intravenous contrast. COMPARISON:  January 04, 2012. FINDINGS: Brain: Mild diffuse cortical atrophy is noted. No mass effect or midline shift is noted. Ventricular size is within normal limits. There is no evidence of mass lesion, hemorrhage or acute infarction. Vascular: No hyperdense vessel or unexpected calcification. Skull: Normal. Negative for fracture or focal  lesion. Sinuses/Orbits: No acute finding. Other: None. IMPRESSION: No acute intracranial abnormality seen. Electronically Signed   By: Marijo Conception M.D.   On: 05/25/2021 13:31   US RENAL  Result Date: 05/25/2021 CLINICAL DATA:  Acute kidney injury. EXAM: RENAL / URINARY TRACT ULTRASOUND COMPLETE COMPARISON:  Renal ultrasound 08/12/2019 FINDINGS: Right Kidney: Renal measurements: 6.8 x 3.4 x 4.5 cm = volume: 59 mL. Echogenicity within normal limits. No mass or hydronephrosis visualized. Left Kidney: Renal measurements: 8.2 x 4.4 x 4.4 cm = volume: 82 mL. 1.3 cm cyst left midpole. Echogenicity within normal limits. No mass or hydronephrosis visualized. Bladder: Bladder volume 48 mL. No bladder mass. Ureteral jets not visualized Other: None IMPRESSION: Small volume kidneys bilaterally without acute abnormality. Electronically Signed  By: Franchot Gallo M.D.   On: 05/25/2021 16:53   US Carotid Bilateral  Result Date: 05/25/2021 CLINICAL DATA:  Altered mental status Syncope Hypertension Carotid stent History of tobacco use EXAM: BILATERAL CAROTID DUPLEX ULTRASOUND TECHNIQUE: Pearline Cables scale imaging, color Doppler and duplex ultrasound were performed of bilateral carotid and vertebral arteries in the neck. COMPARISON:  None. FINDINGS: Criteria: Quantification of carotid stenosis is based on velocity parameters that correlate the residual internal carotid diameter with NASCET-based stenosis levels, using the diameter of the distal internal carotid lumen as the denominator for stenosis measurement. The following velocity measurements were obtained: RIGHT ICA: 97/19 cm/sec CCA: 35/00 cm/sec SYSTOLIC ICA/CCA RATIO:  1.0 ECA: 116 cm/sec LEFT ICA: 116/38 cm/sec CCA: 93/81 cm/sec SYSTOLIC ICA/CCA RATIO:  1.6 ECA: 79 cm/sec RIGHT CAROTID ARTERY: Diffuse intimal thickening of the right common carotid artery. No significant atheromatous plaque identified in the visualized right ICA. RIGHT VERTEBRAL ARTERY:  Antegrade flow.  LEFT CAROTID ARTERY: Minimal heterogeneous plaque of the carotid bifurcation. Mild intimal thickening noted in the proximal left internal carotid artery. LEFT VERTEBRAL ARTERY:  Antegrade flow. IMPRESSION: Less than 50% stenosis of the internal carotid arteries. Electronically Signed   By: Miachel Roux M.D.   On: 05/25/2021 17:08   DG Chest Port 1 View  Result Date: 05/25/2021 CLINICAL DATA:  Weakness.  Fall. EXAM: PORTABLE CHEST 1 VIEW COMPARISON:  Chest x-ray dated April 22, 2016. FINDINGS: The heart size and mediastinal contours are within normal limits. Both lungs are clear. Nipple shadow at the right lung base. The visualized skeletal structures are unremarkable. IMPRESSION: No active disease. Electronically Signed   By: Titus Dubin M.D.   On: 05/25/2021 12:17   EEG adult  Result Date: 05/26/2021 Lora Havens, MD     05/26/2021 10:22 AM Patient Name: Chris Simpson MRN: [id number] Epilepsy Attending: Lora Havens Referring Physician/Provider: Dr Heath Lark Date: 05/26/2021 Duration: 23.28 mins Patient history: 87 year old male with prior CVA, seizures who presented after an episode of alteration of awareness and fall.  EEG to evaluate for seizure. Level of alertness: Awake AEDs during EEG study: Keppra Technical aspects: This EEG study was done with scalp electrodes positioned according to the 10-20 International system of electrode placement. Electrical activity was acquired at a sampling rate of 500Hz  and reviewed with a high frequency filter of 70Hz  and a low frequency filter of 1Hz . EEG data were recorded continuously and digitally stored. Description: The posterior dominant rhythm consists of 7.5 Hz activity of moderate voltage (25-35 uV) seen predominantly in posterior head regions, symmetric and reactive to eye opening and eye closing.  EEG showed continuous 3 to 6 Hz theta-delta slowing in right fronto-centro-temporal region as well as intermittent generalized 3-5 theta-delta  slowing. Hyperventilation and photic stimulation were not performed.   ABNORMALITY - Continuous slow, right fronto-centro-temporal region - Intermittent slow, generalized IMPRESSION: This study is suggestive of cortical dysfunction arising from right fronto-centro-temporal region, likely secondary to underlying stroke. Additionally there is mild diffuse encephalopathy, nonspecific etiology. No seizures or epileptiform discharges were seen throughout the recording. Priyanka [name]        Scheduled Meds:  sodium chloride   Intravenous Once   amLODipine  10 mg Oral Daily   aspirin  325 mg Oral Daily   Chlorhexidine Gluconate Cloth  6 each Topical Daily   darbepoetin (ARANESP) injection - NON-DIALYSIS  40 mcg Subcutaneous Once   labetalol  200 mg Oral BID   levETIRAcetam  500 mg Oral  BID   mouth rinse  15 mL Mouth Rinse BID   oseltamivir  30 mg Oral QODAY   sodium zirconium cyclosilicate  10 g Oral G4B   umeclidinium bromide  1 puff Inhalation Daily   Continuous Infusions:  cefTRIAXone (ROCEPHIN)  IV 1 g (05/26/21 1007)   sodium bicarbonate 150 mEq in D5W infusion 150 mL/hr at 05/26/21 0230     LOS: 1 day    Time spent: 35 minutes    Windsor Zirkelbach Darleen Crocker, DO [facility]  If 7PM-7AM, please contact night-coverage [email or web address] 05/26/2021, 11:27 AM

## 2021-05-26 NOTE — Evaluation (Signed)
Physical Therapy Evaluation Patient Details Name: Chris Simpson MRN: [id number] DOB: [birth year] Today's Date: 05/26/2021  History of Present Illness  Chris Simpson is a 87 y.o. male with medical history significant for prior CVA, seizures, hypertension, likely history of CKD, and ongoing tobacco abuse with likely asthma/COPD who presented to the ED after he was found this morning by his home health aide on the floor.  Per the charts, he apparently lives at home independently and has home health aides who visit him on a regular basis.  He apparently slid out of his chair and lost consciousness and ended up waking up on the floor.  It is uncertain whether or not he has had any seizures, but there does not appear to be any tongue biting or incontinence.  He states that he has been compliant with his home medications.  Apparently he has not been eating or drinking very much and has not felt "right."  Home health aide had spoken with the patient's nephew who states that he has not been able to get in touch with the patient for the last couple weeks.  It is difficult to obtain any further history from the patient as his speech is incomprehensible and he appears otherwise confused.   Clinical Impression  Patient demonstrates slow labored movement for sitting up at bedside, once sitting had difficulty maintaining sitting balance due weakness with occasional leaning backwards, but after a few minutes able to keep trunk in midline without assistance, very unsteady on feet requiring RW due to weakness and limited to a few steps forward/backwards before having to sit due to c/o fatigue.  Patient tolerated sitting up in chair after therapy - RN notified.  Patient will benefit from continued skilled physical therapy in hospital and recommended venue below to increase strength, balance, endurance for safe ADLs and gait.          Recommendations for follow up therapy are one component of a multi-disciplinary discharge  planning process, led by the attending physician.  Recommendations may be updated based on patient status, additional functional criteria and insurance authorization.  Follow Up Recommendations Skilled nursing-short term rehab (<3 hours/Simpson)    Assistance Recommended at Discharge Intermittent Supervision/Assistance  Functional Status Assessment Patient has had a recent decline in their functional status and demonstrates the ability to make significant improvements in function in a reasonable and predictable amount of time.  Equipment Recommendations  Rolling walker (2 wheels)    Recommendations for Other Services       Precautions / Restrictions Precautions Precautions: Fall Restrictions Weight Bearing Restrictions: No      Mobility  Bed Mobility Overal bed mobility: Needs Assistance Bed Mobility: Supine to Sit     Supine to sit: Mod assist     General bed mobility comments: slow labored movement due to generalized weakness    Transfers Overall transfer level: Needs assistance Equipment used: Rolling walker (2 wheels) Transfers: Sit to/from Stand;Bed to chair/wheelchair/BSC Sit to Stand: Min assist;Mod assist   Step pivot transfers: Min assist;Mod assist       General transfer comment: unsteady on feet, slow labored movement, required the use of RW due to BLE weakness    Ambulation/Gait Ambulation/Gait assistance: Mod assist Gait Distance (Feet): 10 Feet Assistive device: Rolling walker (2 wheels) Gait Pattern/deviations: Decreased step length - right;Decreased step length - left;Decreased stride length;Knees buckling Gait velocity: decreased     General Gait Details: limited to a few slow labored steps at bedside forward/backwards before having  to sit due to c/o weakness and fatigue  Stairs            Wheelchair Mobility    Modified Rankin (Stroke Patients Only)       Balance Overall balance assessment: Needs assistance Sitting-balance support:  Feet supported;No upper extremity supported Sitting balance-Leahy Scale: Fair Sitting balance - Comments: fair/good seated at EOB   Standing balance support: Reliant on assistive device for balance;During functional activity;Bilateral upper extremity supported Standing balance-Leahy Scale: Poor Standing balance comment: fair/poor using RW                             Pertinent Vitals/Pain Pain Assessment: Faces Faces Pain Scale: Hurts little more Pain Location: mostly right side of stomach, some in right hip Pain Descriptors / Indicators: Sore;Guarding Pain Intervention(s): Limited activity within patient's tolerance;Monitored during session;Repositioned    Home Living Family/patient expects to be discharged to:: Private residence Living Arrangements: Alone Available Help at Discharge: Personal care attendant;Available PRN/intermittently Type of Home: Apartment Home Access: Elevator       Home Layout: One level Home Equipment: Conservation officer, nature (2 wheels);Cane - quad;Shower seat;Grab bars - tub/shower      Prior Function Prior Level of Function : Needs assist       Physical Assist : Mobility (physical);ADLs (physical) Mobility (physical): Bed mobility;Transfers;Gait   Mobility Comments: household ambulator using cane per patient ADLs Comments: assisted by home aides 7 days/week x 3 hours/Simpson per patient     Hand Dominance   Dominant Hand: Right    Extremity/Trunk Assessment   Upper Extremity Assessment Upper Extremity Assessment: Generalized weakness    Lower Extremity Assessment Lower Extremity Assessment: Generalized weakness    Cervical / Trunk Assessment Cervical / Trunk Assessment: Normal  Communication   Communication: No difficulties  Cognition Arousal/Alertness: Awake/alert Behavior During Therapy: WFL for tasks assessed/performed Overall Cognitive Status: Within Functional Limits for tasks assessed                                           General Comments      Exercises     Assessment/Plan    PT Assessment Patient needs continued PT services  PT Problem List Decreased strength;Decreased activity tolerance;Decreased balance;Decreased mobility       PT Treatment Interventions DME instruction;Gait training;Stair training;Functional mobility training;Therapeutic activities;Therapeutic exercise;Balance training;Patient/family education    PT Goals (Current goals can be found in the Care Plan section)  Acute Rehab PT Goals Patient Stated Goal: return home with home aides to assist PT Goal Formulation: With patient Time For Goal Achievement: 06/09/21 Potential to Achieve Goals: Good    Frequency Min 3X/week   Barriers to discharge        Co-evaluation               AM-PAC PT "6 Clicks" Mobility  Outcome Measure Help needed turning from your back to your side while in a flat bed without using bedrails?: A Lot Help needed moving from lying on your back to sitting on the side of a flat bed without using bedrails?: A Lot Help needed moving to and from a bed to a chair (including a wheelchair)?: A Lot Help needed standing up from a chair using your arms (e.g., wheelchair or bedside chair)?: A Lot Help needed to walk in hospital room?: A Lot Help needed  climbing 3-5 steps with a railing? : Total 6 Click Score: 11    End of Session   Activity Tolerance: Patient tolerated treatment well;Patient limited by fatigue Patient left: in chair;with call bell/phone within reach;with chair alarm set Nurse Communication: Mobility status PT Visit Diagnosis: Unsteadiness on feet (R26.81);Other abnormalities of gait and mobility (R26.89);Muscle weakness (generalized) (M62.81)    Time: 4739-5844 PT Time Calculation (min) (ACUTE ONLY): 31 min   Charges:   PT Evaluation $PT Eval Moderate Complexity: 1 Mod PT Treatments $Therapeutic Activity: 23-37 mins        2:58 PM, 05/26/21 Lonell Grandchild,  MPT Physical Therapist with [facility] 336 [phone] office [phone] mobile phone

## 2021-05-26 NOTE — Progress Notes (Signed)
Dr. Manuella Ghazi made aware that the patient is refusing a blood transfusion.

## 2021-05-26 NOTE — Procedures (Signed)
Patient Name: Chris Simpson  MRN: [id number]  Epilepsy Attending: Lora Havens  Referring Physician/Provider: Dr Heath Lark Date: 05/26/2021 Duration: 23.28 mins  Patient history: 87 year old male with prior CVA, seizures who presented after an episode of alteration of awareness and fall.  EEG to evaluate for seizure.  Level of alertness: Awake  AEDs during EEG study: Keppra  Technical aspects: This EEG study was done with scalp electrodes positioned according to the 10-20 International system of electrode placement. Electrical activity was acquired at a sampling rate of 500Hz  and reviewed with a high frequency filter of 70Hz  and a low frequency filter of 1Hz . EEG data were recorded continuously and digitally stored.   Description: The posterior dominant rhythm consists of 7.5 Hz activity of moderate voltage (25-35 uV) seen predominantly in posterior head regions, symmetric and reactive to eye opening and eye closing.  EEG showed continuous 3 to 6 Hz theta-delta slowing in right fronto-centro-temporal region as well as intermittent generalized 3-5 theta-delta slowing. Hyperventilation and photic stimulation were not performed.     ABNORMALITY - Continuous slow, right fronto-centro-temporal region - Intermittent slow, generalized  IMPRESSION: This study is suggestive of cortical dysfunction arising from right fronto-centro-temporal region, likely secondary to underlying stroke. Additionally there is mild diffuse encephalopathy, nonspecific etiology. No seizures or epileptiform discharges were seen throughout the recording.  [name] [name]

## 2021-05-26 NOTE — Plan of Care (Signed)
°  Problem: Acute Rehab PT Goals(only PT should resolve) Goal: Pt Will Go Supine/Side To Sit Outcome: Progressing Flowsheets (Taken 05/26/2021 1459) Pt will go Supine/Side to Sit: with minimal assist Goal: Patient Will Transfer Sit To/From Stand Outcome: Progressing Flowsheets (Taken 05/26/2021 1459) Patient will transfer sit to/from stand: with minimal assist Goal: Pt Will Transfer Bed To Chair/Chair To Bed Outcome: Progressing Flowsheets (Taken 05/26/2021 1459) Pt will Transfer Bed to Chair/Chair to Bed: with min assist Goal: Pt Will Ambulate Outcome: Progressing Flowsheets (Taken 05/26/2021 1459) Pt will Ambulate:  25 feet  with minimal assist  with rolling walker  with moderate assist   3:02 PM, 05/26/21 Lonell Grandchild, MPT Physical Therapist with [facility] [phone] office [phone] mobile phone

## 2021-05-26 NOTE — Progress Notes (Signed)
EEG complete - results pending 

## 2021-05-26 NOTE — Progress Notes (Signed)
Initial Nutrition Assessment  DOCUMENTATION CODES:  Severe malnutrition in context of chronic illness  INTERVENTION:  Add Nepro Shake po TID, each supplement provides 425 kcal and 19 grams protein   Add Rena-Vite daily.  Encourage PO and supplement intake.  NUTRITION DIAGNOSIS:  Severe Malnutrition related to chronic illness (COPD, CVAs) as evidenced by severe fat depletion, severe muscle depletion.  GOAL:  Patient will meet greater than or equal to 90% of their needs  MONITOR:  PO intake, Supplement acceptance, Labs, Skin, I & O's  REASON FOR ASSESSMENT:  Malnutrition Screening Tool    ASSESSMENT:  87 yo male with a PMH of prior CVA, seizures, hypertension, likely history of CKD, and ongoing tobacco abuse with likely asthma/COPD who presented to the ED after he was found this morning by his home health aide on the floor. Admitted with AMS.  Attempted to visit with pt. Pt a bit confused and unable to provide nutrition history that made logical sense.  Per Epic, pt ate 0% of breakfast and 50% of lunch today.  No recent weight history in Epic or Care Everywhere.  Pt was able to report that he likes Ensure and would like to drink that. Given pt's high K, RD to order Nepro shakes TID.  Medications: reviewed; Keppra BID, Lokelma 4 times once, Na Bicarb 150 mEq in D5  Labs: reviewed; K 6.1 (H), Glucose 141 (H), BUN 97 (H - trending down), Crt 5.45 (H)  NUTRITION - FOCUSED PHYSICAL EXAM: Flowsheet Row Most Recent Value  Orbital Region Severe depletion  Upper Arm Region Moderate depletion  Thoracic and Lumbar Region Severe depletion  Buccal Region Severe depletion  Temple Region Moderate depletion  Clavicle Bone Region Moderate depletion  Clavicle and Acromion Bone Region Severe depletion  Scapular Bone Region Unable to assess  Dorsal Hand Moderate depletion  Patellar Region Severe depletion  Anterior Thigh Region Severe depletion  Posterior Calf Region Severe depletion   Edema (RD Assessment) None  Hair Reviewed  Eyes Reviewed  Mouth Reviewed  Skin Reviewed  Nails Reviewed   Diet Order:   Diet Order             Diet renal with fluid restriction Fluid restriction: 1200 mL Fluid; Room service appropriate? Yes; Fluid consistency: Thin  Diet effective now                  EDUCATION NEEDS:  Education needs have been addressed  Skin:  Skin Assessment: Reviewed RN Assessment  Last BM:  PTA  Height:  Ht Readings from Last 1 Encounters:  05/25/21 5\' 8"  (1.727 m)   Weight:  Wt Readings from Last 1 Encounters:  05/26/21 63.2 kg   BMI:  Body mass index is 21.19 kg/m.  Estimated Nutritional Needs:  Kcal:  1900-2100 Protein:  85-100 grams Fluid:  1200 ml per MD  Derrel Nip, RD, LDN (she/her/hers) Clinical Inpatient Dietitian RD Pager/After-Hours/Weekend Pager # in [place]

## 2021-05-27 LAB — CBC
HCT: 19.9 % — ABNORMAL LOW (ref 39.0–52.0)
Hemoglobin: 6.2 g/dL — CL (ref 13.0–17.0)
MCH: 34.3 pg — ABNORMAL HIGH (ref 26.0–34.0)
MCHC: 31.2 g/dL (ref 30.0–36.0)
MCV: 109.9 fL — ABNORMAL HIGH (ref 80.0–100.0)
Platelets: 132 10*3/uL — ABNORMAL LOW (ref 150–400)
RBC: 1.81 MIL/uL — ABNORMAL LOW (ref 4.22–5.81)
RDW: 16.6 % — ABNORMAL HIGH (ref 11.5–15.5)
WBC: 5 10*3/uL (ref 4.0–10.5)
nRBC: 0.4 % — ABNORMAL HIGH (ref 0.0–0.2)

## 2021-05-27 LAB — BASIC METABOLIC PANEL
Anion gap: 13 (ref 5–15)
BUN: 84 mg/dL — ABNORMAL HIGH (ref 8–23)
CO2: 25 mmol/L (ref 22–32)
Calcium: 8.5 mg/dL — ABNORMAL LOW (ref 8.9–10.3)
Chloride: 106 mmol/L (ref 98–111)
Creatinine, Ser: 5.35 mg/dL — ABNORMAL HIGH (ref 0.61–1.24)
GFR, Estimated: 10 mL/min — ABNORMAL LOW (ref >60)
Glucose, Bld: 97 mg/dL (ref 70–99)
Potassium: 5.3 mmol/L — ABNORMAL HIGH (ref 3.5–5.1)
Sodium: 144 mmol/L (ref 135–145)

## 2021-05-27 LAB — URINE CULTURE

## 2021-05-27 LAB — MAGNESIUM: Magnesium: 1.3 mg/dL — ABNORMAL LOW (ref 1.7–2.4)

## 2021-05-27 MED ORDER — GLYCOPYRROLATE 1 MG PO TABS: 1.0000 mg | ORAL_TABLET | ORAL | Status: DC | PRN

## 2021-05-27 MED ORDER — LORAZEPAM 2 MG/ML IJ SOLN: 1.0000 mg | INTRAMUSCULAR | Status: DC | PRN

## 2021-05-27 MED ORDER — LORAZEPAM 2 MG/ML PO CONC: 1.0000 mg | ORAL | Status: DC | PRN

## 2021-05-27 MED ORDER — MAGNESIUM SULFATE 2 GM/50ML IV SOLN
2.0000 g | Freq: Once | INTRAVENOUS | Status: DC
  Administered 2021-05-27: 2 g via INTRAVENOUS
  Filled 2021-05-27: qty 50

## 2021-05-27 MED ORDER — GLYCOPYRROLATE 0.2 MG/ML IJ SOLN: 0.2000 mg | INTRAMUSCULAR | Status: DC | PRN

## 2021-05-27 MED ORDER — HYDROMORPHONE HCL 1 MG/ML IJ SOLN
0.5000 mg | INTRAMUSCULAR | Status: DC | PRN
  Administered 2021-05-31: 0.5 mg via INTRAVENOUS
  Filled 2021-05-27: qty 0.5

## 2021-05-27 MED ORDER — LORAZEPAM 1 MG PO TABS
1.0000 mg | ORAL_TABLET | ORAL | Status: DC | PRN
  Administered 2021-05-29: 1 mg via ORAL
  Filled 2021-05-27: qty 1

## 2021-05-27 NOTE — Progress Notes (Signed)
PROGRESS NOTE    Chris Simpson  AQT:[id number] DOB: 1931-10-06 DOA: 05/25/2021 PCP: Rosita Fire, MD   Brief Narrative:   Chris Simpson Chris Simpson is a 87 y.o. male with medical history significant for prior CVA, seizures, hypertension, likely history of CKD, and ongoing tobacco abuse with likely asthma/COPD who presented to the ED after he was found this morning by his home health aide on the floor.  Per the charts, he apparently lives at home independently and has home health aides who visit him on a regular basis.  He apparently slid out of his chair and lost consciousness and ended up waking up on the floor.  Chris was admitted with acute encephalopathy of uncertain origin.  He was noted to have significant metabolic disturbances with severe hyperkalemia and metabolic acidosis related to what appears to be AKI and advanced CKD.  He is also noted to have influenza A.  He has refused hemodialysis and even blood transfusions at this point and continues to have declining hemoglobin levels.  After further discussion with nephew on 12/31, Chris and family are agreeable to comfort care and eventual discharge to hospice facility.  Assessment & Plan:   Principal Problem:   Acute metabolic encephalopathy   Acute encephalopathy-multifactorial -Currently appears at baseline -Appears to have had loss of consciousness and uncertain if syncopal episode versus seizure -Carotid ultrasound with no acute findings and 2D echocardiogram as noted below with no acute findings -TSH 5.137 and free T4 ordered -Seizure work-up with EEG demonstrating some right-sided cortical dysfunction, but no seizure activity, CT head with no acute findings; seizure precautions -Severe hyperkalemia improving -UTI may be causing some of the issues   UTI -Appears to be related to some urinary retention issues and likely BPH -Obtain urine culture -Start Rocephin 12/30   Worsening anemia likely due to chronic disease -Anemia panel  without any signs of iron deficiency -Chris refuses 1 unit PRBC transfusion -Aranesp ordered per nephrology -Continue to monitor -No overt bleeding noted   History of prior CVA/seizures -Continue on Keppra -Checked EEG with no findings of seizure activity -Continue aspirin   AKI on CKD stage IIIb -Uncertain baseline as prior creatinine was near 5.6-2.5 in 6389 -Uncertain component of AKI with poor oral intake at home -Continue IV fluid and Lasix as ordered -CK is 242 -Renal ultrasound with no acute findings -Nephrology following and Chris refuses hemodialysis   Severe hyperkalemia secondary to AKI and losartan use-improving -Calcium gluconate, IV insulin, D50, sodium bicarbonate, and IV fluids started in ED -Continue Lasix and IV fluid -EKG without significant changes -Repeat BMP at 1600 per nephrology recommendations -Further recommendations per nephrology   Metabolic acidosis secondary to above -Continue sodium bicarbonate and monitor   Influenza A -Start on Tamiflu with contact precautions -He had noticed decreased appetite recently with poor oral intake likely related to this   Uncontrolled hypertension -Continue home amlodipine and labetalol -IV hydralazine as needed -Hold losartan   Ongoing tobacco abuse -Counseled on cessation  Multiple life prolonging medications discontinued at this point and Chris will be transition to comfort care.     DVT prophylaxis: Heparin to SCDs Code Status: DNR/comfort care Family Communication: Discussed with Nephew on phone [phone] Disposition Plan:  Status is: Inpatient   Remains inpatient appropriate because: Requires ongoing IV medications.   Consultants:  Nephrology   Procedures:  See below   Antimicrobials:  Rocephin 12/30>>12/31   Subjective: Chris seen and evaluated today with no new acute complaints or concerns. No acute  concerns or events noted overnight.  He states that he would not like any  further intervention to include blood transfusions.  This was confirmed with nephew on the phone who is agreeable to comfort care.  Objective: Vitals:   05/27/21 0734 05/27/21 0800 05/27/21 0900 05/27/21 0943  BP:  (!) 137/58 139/66 139/66  Pulse:  78 88 80  Resp:  13 17 16   Temp:      TempSrc:      SpO2: 98% 96% 93% 96%  Weight:      Height:        Intake/Output Summary (Last 24 hours) at 05/27/2021 1131 Last data filed at 05/27/2021 0653 Gross per 24 hour  Intake 2360.77 ml  Output 1375 ml  Net 985.77 ml   Filed Weights   05/25/21 2104 05/26/21 0433 05/27/21 0600  Weight: 63.7 kg 63.2 kg 66 kg    Examination:  General exam: Appears calm and comfortable  Respiratory system: Clear to auscultation. Respiratory effort normal. Cardiovascular system: S1 & S2 heard, RRR.  Gastrointestinal system: Abdomen is soft Central nervous system: Alert and awake Extremities: No edema Skin: No significant lesions noted Psychiatry: Flat affect.    Data Reviewed: I have personally reviewed following labs and imaging studies  CBC: Recent Labs  Lab 05/25/21 1247 05/25/21 1542 05/26/21 0421 05/26/21 0729 05/27/21 0553  WBC 10.1 8.7 6.0 5.6 5.0  NEUTROABS 7.2  --   --   --   --   HGB 8.2* 7.6* 6.7* 6.6* 6.2*  HCT 26.6* 26.1* 20.8* 19.9* 19.9*  MCV 116.2* 120.3* 111.8* 108.2* 109.9*  PLT 190 178 149* 141* 062*   Basic Metabolic Panel: Recent Labs  Lab 05/25/21 1544 05/25/21 2018 05/26/21 0421 05/26/21 0520 05/26/21 1345 05/27/21 0553  NA 145 142 143  --  142 144  K 6.5* 7.0* 6.1* 6.1* 5.2* 5.3*  CL 120* 119* 114*  --  107 106  CO2 11* 14* 18*  --  23 25  GLUCOSE 149* 116* 141*  --  172* 97  BUN 113* 99* 97*  --  91* 84*  CREATININE 6.72* 5.86* 5.45*  --  5.38* 5.35*  CALCIUM 10.7* 9.1 9.1  --  8.5* 8.5*  MG  --   --  1.5*  --   --  1.3*  PHOS 4.8*  --  3.8  --   --   --    GFR: Estimated Creatinine Clearance: 9.1 mL/min (A) (by C-G formula based on SCr of 5.35  mg/dL (H)). Liver Function Tests: Recent Labs  Lab 05/25/21 1247 05/25/21 1544 05/26/21 0421  AST 26  --   --   ALT 15  --   --   ALKPHOS 76  --   --   BILITOT 0.7  --   --   PROT 9.1*  --   --   ALBUMIN 4.7 4.5 3.6   No results for input(s): LIPASE, AMYLASE in the last 168 hours. No results for input(s): AMMONIA in the last 168 hours. Coagulation Profile: No results for input(s): INR, PROTIME in the last 168 hours. Cardiac Enzymes: Recent Labs  Lab 05/25/21 1247  CKTOTAL 242   BNP (last 3 results) No results for input(s): PROBNP in the last 8760 hours. HbA1C: Recent Labs    05/25/21 1542  HGBA1C 5.8*   CBG: No results for input(s): GLUCAP in the last 168 hours. Lipid Profile: No results for input(s): CHOL, HDL, LDLCALC, TRIG, CHOLHDL, LDLDIRECT in the last 72 hours. Thyroid  Function Tests: Recent Labs    05/25/21 1542 05/26/21 0835  TSH 5.137*  --   FREET4  --  0.90   Anemia Panel: Recent Labs    05/25/21 1542  VITAMINB12 471  FOLATE 4.7*  FERRITIN 1,619*  TIBC 263  IRON 52  RETICCTPCT 1.1   Sepsis Labs: Recent Labs  Lab 05/25/21 1328  LATICACIDVEN 1.7    Recent Results (from the past 240 hour(s))  Resp Panel by RT-PCR (Flu A&B, Covid) Nasopharyngeal Swab     Status: Abnormal   Collection Time: 05/25/21 12:26 PM   Specimen: Nasopharyngeal Swab; Nasopharyngeal(NP) swabs in vial transport medium  Result Value Ref Range Status   SARS Coronavirus 2 by RT PCR NEGATIVE NEGATIVE Final    Comment: (NOTE) SARS-CoV-2 target nucleic acids are NOT DETECTED.  The SARS-CoV-2 RNA is generally detectable in upper respiratory specimens during the acute phase of infection. The lowest concentration of SARS-CoV-2 viral copies this assay can detect is 138 copies/mL. A negative result does not preclude SARS-Cov-2 infection and should not be used as the sole basis for treatment or other Chris management decisions. A negative result may occur with  improper  specimen collection/handling, submission of specimen other than nasopharyngeal swab, presence of viral mutation(s) within the areas targeted by this assay, and inadequate number of viral copies(<138 copies/mL). A negative result must be combined with clinical observations, Chris history, and epidemiological information. The expected result is Negative.  Fact Sheet for Patients:  EntrepreneurPulse.com.au  Fact Sheet for Healthcare Providers:  IncredibleEmployment.be  This test is no t yet approved or cleared by the [place] FDA and  has been authorized for detection and/or diagnosis of SARS-CoV-2 by FDA under an Emergency Use Authorization (EUA). This EUA will remain  in effect (meaning this test can be used) for the duration of the COVID-19 declaration under Section 564(b)(1) of the Act, 21 U.S.C.section 360bbb-3(b)(1), unless the authorization is terminated  or revoked sooner.       Influenza A by PCR POSITIVE (A) NEGATIVE Final   Influenza B by PCR NEGATIVE NEGATIVE Final    Comment: (NOTE) The Xpert Xpress SARS-CoV-2/FLU/RSV plus assay is intended as an aid in the diagnosis of influenza from Nasopharyngeal swab specimens and should not be used as a sole basis for treatment. Nasal washings and aspirates are unacceptable for Xpert Xpress SARS-CoV-2/FLU/RSV testing.  Fact Sheet for Patients: EntrepreneurPulse.com.au  Fact Sheet for Healthcare Providers: IncredibleEmployment.be  This test is not yet approved or cleared by the [place] FDA and has been authorized for detection and/or diagnosis of SARS-CoV-2 by FDA under an Emergency Use Authorization (EUA). This EUA will remain in effect (meaning this test can be used) for the duration of the COVID-19 declaration under Section 564(b)(1) of the Act, 21 U.S.C. section 360bbb-3(b)(1), unless the authorization is terminated or revoked.  Performed  at [facility], [address] [place], [place] [address]   MRSA Next Gen by PCR, Nasal     Status: None   Collection Time: 05/25/21  8:44 PM   Specimen: Nasal Mucosa; Nasal Swab  Result Value Ref Range Status   MRSA by PCR Next Gen NOT DETECTED NOT DETECTED Final    Comment: (NOTE) The GeneXpert MRSA Assay (FDA approved for NASAL specimens only), is one component of a comprehensive MRSA colonization surveillance program. It is not intended to diagnose MRSA infection nor to guide or monitor treatment for MRSA infections. Test performance is not FDA approved in patients less than 2 years  old. Performed at [facility], [address] [place], [place] [address]          Radiology Studies: CT Head Wo Contrast  Result Date: 05/25/2021 CLINICAL DATA:  Altered mental status. EXAM: CT HEAD WITHOUT CONTRAST TECHNIQUE: Contiguous axial images were obtained from the base of the skull through the vertex without intravenous contrast. COMPARISON:  January 04, 2012. FINDINGS: Brain: Mild diffuse cortical atrophy is noted. No mass effect or midline shift is noted. Ventricular size is within normal limits. There is no evidence of mass lesion, hemorrhage or acute infarction. Vascular: No hyperdense vessel or unexpected calcification. Skull: Normal. Negative for fracture or focal lesion. Sinuses/Orbits: No acute finding. Other: None. IMPRESSION: No acute intracranial abnormality seen. Electronically Signed   By: Marijo Conception M.D.   On: 05/25/2021 13:31   US RENAL  Result Date: 05/25/2021 CLINICAL DATA:  Acute kidney injury. EXAM: RENAL / URINARY TRACT ULTRASOUND COMPLETE COMPARISON:  Renal ultrasound 08/12/2019 FINDINGS: Right Kidney: Renal measurements: 6.8 x 3.4 x 4.5 cm = volume: 59 mL. Echogenicity within normal limits. No mass or hydronephrosis visualized. Left Kidney: Renal measurements: 8.2 x 4.4 x 4.4 cm = volume: 82 mL. 1.3 cm cyst left midpole. Echogenicity within normal limits. No mass or  hydronephrosis visualized. Bladder: Bladder volume 48 mL. No bladder mass. Ureteral jets not visualized Other: None IMPRESSION: Small volume kidneys bilaterally without acute abnormality. Electronically Signed   By: Franchot Gallo M.D.   On: 05/25/2021 16:53   US Carotid Bilateral  Result Date: 05/25/2021 CLINICAL DATA:  Altered mental status Syncope Hypertension Carotid stent History of tobacco use EXAM: BILATERAL CAROTID DUPLEX ULTRASOUND TECHNIQUE: Pearline Cables scale imaging, color Doppler and duplex ultrasound were performed of bilateral carotid and vertebral arteries in the neck. COMPARISON:  None. FINDINGS: Criteria: Quantification of carotid stenosis is based on velocity parameters that correlate the residual internal carotid diameter with NASCET-based stenosis levels, using the diameter of the distal internal carotid lumen as the denominator for stenosis measurement. The following velocity measurements were obtained: RIGHT ICA: 97/19 cm/sec CCA: 38/75 cm/sec SYSTOLIC ICA/CCA RATIO:  1.0 ECA: 116 cm/sec LEFT ICA: 116/38 cm/sec CCA: 64/33 cm/sec SYSTOLIC ICA/CCA RATIO:  1.6 ECA: 79 cm/sec RIGHT CAROTID ARTERY: Diffuse intimal thickening of the right common carotid artery. No significant atheromatous plaque identified in the visualized right ICA. RIGHT VERTEBRAL ARTERY:  Antegrade flow. LEFT CAROTID ARTERY: Minimal heterogeneous plaque of the carotid bifurcation. Mild intimal thickening noted in the proximal left internal carotid artery. LEFT VERTEBRAL ARTERY:  Antegrade flow. IMPRESSION: Less than 50% stenosis of the internal carotid arteries. Electronically Signed   By: Miachel Roux M.D.   On: 05/25/2021 17:08   DG Chest Port 1 View  Result Date: 05/25/2021 CLINICAL DATA:  Weakness.  Fall. EXAM: PORTABLE CHEST 1 VIEW COMPARISON:  Chest x-ray dated April 22, 2016. FINDINGS: The heart size and mediastinal contours are within normal limits. Both lungs are clear. Nipple shadow at the right lung base. The  visualized skeletal structures are unremarkable. IMPRESSION: No active disease. Electronically Signed   By: Titus Dubin M.D.   On: 05/25/2021 12:17   EEG adult  Result Date: 05/26/2021 Lora Havens, MD     05/26/2021 10:22 AM Chris Name: Chris Simpson MRN: [id number] Epilepsy Attending: Lora Havens Referring Physician/Provider: Dr Heath Lark Date: 05/26/2021 Duration: 23.28 mins Chris history: 87 year old male with prior CVA, seizures who presented after an episode of alteration of awareness and fall.  EEG to evaluate for  seizure. Level of alertness: Awake AEDs during EEG study: Keppra Technical aspects: This EEG study was done with scalp electrodes positioned according to the 10-20 International system of electrode placement. Electrical activity was acquired at a sampling rate of 500Hz  and reviewed with a high frequency filter of 70Hz  and a low frequency filter of 1Hz . EEG data were recorded continuously and digitally stored. Description: The posterior dominant rhythm consists of 7.5 Hz activity of moderate voltage (25-35 uV) seen predominantly in posterior head regions, symmetric and reactive to eye opening and eye closing.  EEG showed continuous 3 to 6 Hz theta-delta slowing in right fronto-centro-temporal region as well as intermittent generalized 3-5 theta-delta slowing. Hyperventilation and photic stimulation were not performed.   ABNORMALITY - Continuous slow, right fronto-centro-temporal region - Intermittent slow, generalized IMPRESSION: This study is suggestive of cortical dysfunction arising from right fronto-centro-temporal region, likely secondary to underlying stroke. Additionally there is mild diffuse encephalopathy, nonspecific etiology. No seizures or epileptiform discharges were seen throughout the recording. Lora Havens   ECHOCARDIOGRAM COMPLETE  Result Date: 05/26/2021    ECHOCARDIOGRAM REPORT   Chris Name:   Chris Simpson Date of Exam: 05/26/2021 Medical Rec #:   [id number]   Height:       68.0 in Accession #:    [id number]  Weight:       139.3 lb Date of Birth:  03/30/1932   BSA:          1.753 m Chris Age:    61 years    BP:           151/77 mmHg Chris Gender: M           HR:           86 bpm. Exam Location:  [facility] Procedure: 2D Echo, Cardiac Doppler and Color Doppler Indications:    Syncope  History:        Chris has prior history of Echocardiogram examinations, most                 recent 01/07/2012. Stroke and COPD; Risk Factors:Hypertension.  Sonographer:    Wenda Low Referring Phys: [id number] [place]  1. Left ventricular ejection fraction, by estimation, is 60 to 65%. The left ventricle has normal function. The left ventricle has no regional wall motion abnormalities. Left ventricular diastolic parameters were normal.  2. Right ventricular systolic function is normal. The right ventricular size is normal. There is moderately elevated pulmonary artery systolic pressure.  3. The mitral valve is abnormal. Trivial mitral valve regurgitation. No evidence of mitral stenosis.  4. The aortic valve is normal in structure. Aortic valve regurgitation is not visualized. No aortic stenosis is present.  5. The inferior vena cava is normal in size with greater than 50% respiratory variability, suggesting right atrial pressure of 3 mmHg. FINDINGS  Left Ventricle: Left ventricular ejection fraction, by estimation, is 60 to 65%. The left ventricle has normal function. The left ventricle has no regional wall motion abnormalities. The left ventricular internal cavity size was normal in size. There is  no left ventricular hypertrophy. Left ventricular diastolic parameters were normal. Right Ventricle: The right ventricular size is normal. No increase in right ventricular wall thickness. Right ventricular systolic function is normal. There is moderately elevated pulmonary artery systolic pressure. The tricuspid regurgitant velocity is 3.07 m/s, and with an  assumed right atrial pressure of 8 mmHg, the estimated right ventricular systolic pressure is 18.8 mmHg. Left Atrium: Left atrial  size was normal in size. Right Atrium: Right atrial size was normal in size. Pericardium: There is no evidence of pericardial effusion. Mitral Valve: The mitral valve is abnormal. There is mild thickening of the mitral valve leaflet(s). Trivial mitral valve regurgitation. No evidence of mitral valve stenosis. MV peak gradient, 4.1 mmHg. The mean mitral valve gradient is 2.0 mmHg. Tricuspid Valve: The tricuspid valve is normal in structure. Tricuspid valve regurgitation is not demonstrated. No evidence of tricuspid stenosis. Aortic Valve: The aortic valve is normal in structure. Aortic valve regurgitation is not visualized. No aortic stenosis is present. Aortic valve mean gradient measures 3.0 mmHg. Aortic valve peak gradient measures 6.0 mmHg. Aortic valve area, by VTI measures 1.91 cm. Pulmonic Valve: The pulmonic valve was normal in structure. Pulmonic valve regurgitation is not visualized. No evidence of pulmonic stenosis. Aorta: The aortic root is normal in size and structure. Venous: The inferior vena cava is normal in size with greater than 50% respiratory variability, suggesting right atrial pressure of 3 mmHg. IAS/Shunts: No atrial level shunt detected by color flow Doppler.  LEFT VENTRICLE PLAX 2D LVIDd:         3.70 cm     Diastology LVIDs:         2.70 cm     LV e' medial:    3.92 cm/s LV PW:         1.00 cm     LV E/e' medial:  22.4 LV IVS:        1.10 cm     LV e' lateral:   6.85 cm/s LVOT diam:     1.80 cm     LV E/e' lateral: 12.8 LV SV:         58 LV SV Index:   33 LVOT Area:     2.54 cm  LV Volumes (MOD) LV vol d, MOD A2C: 38.4 ml LV vol d, MOD A4C: 40.0 ml LV vol s, MOD A2C: 15.2 ml LV vol s, MOD A4C: 15.2 ml LV SV MOD A2C:     23.2 ml LV SV MOD A4C:     40.0 ml LV SV MOD BP:      26.0 ml RIGHT VENTRICLE RV Basal diam:  3.60 cm RV Mid diam:    2.70 cm RV S prime:      10.00 cm/s TAPSE (M-mode): 2.4 cm LEFT ATRIUM             Index        RIGHT ATRIUM           Index LA diam:        3.10 cm 1.77 cm/m   RA Area:     14.50 cm LA Vol (A2C):   51.6 ml 29.44 ml/m  RA Volume:   42.00 ml  23.96 ml/m LA Vol (A4C):   63.4 ml 36.17 ml/m LA Biplane Vol: 61.6 ml 35.15 ml/m  AORTIC VALVE                    PULMONIC VALVE AV Area (Vmax):    2.00 cm     PV Vmax:       1.12 m/s AV Area (Vmean):   1.64 cm     PV Peak grad:  5.0 mmHg AV Area (VTI):     1.91 cm AV Vmax:           122.00 cm/s AV Vmean:          83.200 cm/s AV VTI:  0.301 m AV Peak Grad:      6.0 mmHg AV Mean Grad:      3.0 mmHg LVOT Vmax:         96.00 cm/s LVOT Vmean:        53.700 cm/s LVOT VTI:          0.226 m LVOT/AV VTI ratio: 0.75  AORTA Ao Root diam: 2.90 cm MITRAL VALVE               TRICUSPID VALVE MV Area (PHT): 2.63 cm    TR Peak grad:   37.7 mmHg MV Area VTI:   2.13 cm    TR Vmax:        307.00 cm/s MV Peak grad:  4.1 mmHg MV Mean grad:  2.0 mmHg    SHUNTS MV Vmax:       1.01 m/s    Systemic VTI:  0.23 m MV Vmean:      64.8 cm/s   Systemic Diam: 1.80 cm MV Decel Time: 288 msec MV E velocity: 87.80 cm/s MV A velocity: 90.40 cm/s MV E/A ratio:  0.97 Jenkins Rouge MD Electronically signed by Jenkins Rouge MD Signature Date/Time: 05/26/2021/11:58:48 AM    Final         Scheduled Meds:  sodium chloride   Intravenous Once   amLODipine  10 mg Oral Daily   aspirin  325 mg Oral Daily   Chlorhexidine Gluconate Cloth  6 each Topical Daily   feeding supplement (NEPRO CARB STEADY)  237 mL Oral TID BM   labetalol  200 mg Oral BID   levETIRAcetam  500 mg Oral BID   mouth rinse  15 mL Mouth Rinse BID   multivitamin  1 tablet Oral QHS   umeclidinium bromide  1 puff Inhalation Daily   Continuous Infusions:  sodium chloride Stopped (05/27/21 1126)   cefTRIAXone (ROCEPHIN)  IV 1 g (05/27/21 0950)   magnesium sulfate bolus IVPB 2 g (05/27/21 1126)     LOS: 2 days    Time spent: 35  minutes    Kayson Tasker Darleen Crocker, DO [facility]  If 7PM-7AM, please contact night-coverage [email or web address] 05/27/2021, 11:31 AM

## 2021-05-28 NOTE — Progress Notes (Signed)
PROGRESS NOTE    Chris Simpson  MGQ:[id number] DOB: 15-Apr-1932 DOA: 05/25/2021 PCP: Rosita Fire, MD   Brief Narrative:   Chris Simpson is a 88 y.o. male with medical history significant for prior CVA, seizures, hypertension, likely history of CKD, and ongoing tobacco abuse with likely asthma/COPD who presented to the ED after he was found this morning by his home health aide on the floor.  Per the charts, he apparently lives at home independently and has home health aides who visit him on a regular basis.  He apparently slid out of his chair and lost consciousness and ended up waking up on the floor.  Patient was admitted with acute encephalopathy of uncertain origin.  He was noted to have significant metabolic disturbances with severe hyperkalemia and metabolic acidosis related to what appears to be AKI and advanced CKD.  He is also noted to have influenza A.  He has refused hemodialysis and even blood transfusions at this point and continues to have declining hemoglobin levels.  After further discussion with nephew on 12/31, patient and family are agreeable to comfort care and eventual discharge to hospice facility.  Assessment & Plan:   Principal Problem:   Acute metabolic encephalopathy   Acute encephalopathy-multifactorial -Currently appears at baseline -Appears to have had loss of consciousness and uncertain if syncopal episode versus seizure -Carotid ultrasound with no acute findings and 2D echocardiogram as noted below with no acute findings -TSH 5.137 and free T4 ordered -Seizure work-up with EEG demonstrating some right-sided cortical dysfunction, but no seizure activity, CT head with no acute findings; seizure precautions -Severe hyperkalemia improving -UTI may be causing some of the issues   UTI -Appears to be related to some urinary retention issues and likely BPH -Obtain urine culture -Start Rocephin 12/30   Worsening anemia likely due to chronic disease -Anemia panel  without any signs of iron deficiency -Patient refuses 1 unit PRBC transfusion -Aranesp ordered per nephrology -Continue to monitor -No overt bleeding noted   History of prior CVA/seizures -Continue on Keppra -Checked EEG with no findings of seizure activity -Continue aspirin   AKI on CKD stage IIIb -Uncertain baseline as prior creatinine was near 2.6-7.1 in 2458 -Uncertain component of AKI with poor oral intake at home -Continue IV fluid and Lasix as ordered -CK is 242 -Renal ultrasound with no acute findings -Nephrology following and patient refuses hemodialysis   Severe hyperkalemia secondary to AKI and losartan use-improving -Calcium gluconate, IV insulin, D50, sodium bicarbonate, and IV fluids started in ED -Continue Lasix and IV fluid -EKG without significant changes -Repeat BMP at 1600 per nephrology recommendations -Further recommendations per nephrology   Metabolic acidosis secondary to above -Continue sodium bicarbonate and monitor   Influenza A -Start on Tamiflu with contact precautions -He had noticed decreased appetite recently with poor oral intake likely related to this   Uncontrolled hypertension -Continue home amlodipine and labetalol -IV hydralazine as needed -Hold losartan   Ongoing tobacco abuse -Counseled on cessation   Multiple life prolonging medications discontinued  on 12/31; transitioned to comfort care on 1/1.  Anticipating discharge to hospice facility in the next 24-48 hours     DVT prophylaxis: None Code Status: DNR/comfort care Family Communication: Discussed with Nephew on phone [phone] Disposition Plan:  Status is: Inpatient   Remains inpatient appropriate because: Requires ongoing IV medications.   Consultants:  Nephrology   Procedures:  See below   Antimicrobials:  Rocephin 12/30>>12/31   Subjective: Patient seen and evaluated today.  He is resting comfortably with no acute overnight events  noted.  Objective: Vitals:   05/27/21 2034 05/27/21 2116 05/28/21 0012 05/28/21 0454  BP: (!) 144/55 (!) 127/47 (!) 124/91 (!) 120/52  Pulse: 76 68 73 71  Resp: 16 16 17 18   Temp: 98.4 F (36.9 C) 98 F (36.7 C) 98.3 F (36.8 C) 98.7 F (37.1 C)  TempSrc:  Oral Oral Oral  SpO2: 97% 98% 97% 95%  Weight:      Height:        Intake/Output Summary (Last 24 hours) at 05/28/2021 1028 Last data filed at 05/28/2021 0900 Gross per 24 hour  Intake 908.27 ml  Output 400 ml  Net 508.27 ml   Filed Weights   05/25/21 2104 05/26/21 0433 05/27/21 0600  Weight: 63.7 kg 63.2 kg 66 kg    Examination:  General exam: Appears calm and comfortable, somnolent Respiratory system: Clear to auscultation. Respiratory effort normal. Cardiovascular system: S1 & S2 heard, RRR.  Gastrointestinal system: Abdomen is soft Central nervous system: Somnolent Extremities: No edema Skin: No significant lesions noted Psychiatry: Flat affect.    Data Reviewed: I have personally reviewed following labs and imaging studies  CBC: Recent Labs  Lab 05/25/21 1247 05/25/21 1542 05/26/21 0421 05/26/21 0729 05/27/21 0553  WBC 10.1 8.7 6.0 5.6 5.0  NEUTROABS 7.2  --   --   --   --   HGB 8.2* 7.6* 6.7* 6.6* 6.2*  HCT 26.6* 26.1* 20.8* 19.9* 19.9*  MCV 116.2* 120.3* 111.8* 108.2* 109.9*  PLT 190 178 149* 141* 756*   Basic Metabolic Panel: Recent Labs  Lab 05/25/21 1544 05/25/21 2018 05/26/21 0421 05/26/21 0520 05/26/21 1345 05/27/21 0553  NA 145 142 143  --  142 144  K 6.5* 7.0* 6.1* 6.1* 5.2* 5.3*  CL 120* 119* 114*  --  107 106  CO2 11* 14* 18*  --  23 25  GLUCOSE 149* 116* 141*  --  172* 97  BUN 113* 99* 97*  --  91* 84*  CREATININE 6.72* 5.86* 5.45*  --  5.38* 5.35*  CALCIUM 10.7* 9.1 9.1  --  8.5* 8.5*  MG  --   --  1.5*  --   --  1.3*  PHOS 4.8*  --  3.8  --   --   --    GFR: Estimated Creatinine Clearance: 9.1 mL/min (A) (by C-G formula based on SCr of 5.35 mg/dL (H)). Liver Function  Tests: Recent Labs  Lab 05/25/21 1247 05/25/21 1544 05/26/21 0421  AST 26  --   --   ALT 15  --   --   ALKPHOS 76  --   --   BILITOT 0.7  --   --   PROT 9.1*  --   --   ALBUMIN 4.7 4.5 3.6   No results for input(s): LIPASE, AMYLASE in the last 168 hours. No results for input(s): AMMONIA in the last 168 hours. Coagulation Profile: No results for input(s): INR, PROTIME in the last 168 hours. Cardiac Enzymes: Recent Labs  Lab 05/25/21 1247  CKTOTAL 242   BNP (last 3 results) No results for input(s): PROBNP in the last 8760 hours. HbA1C: Recent Labs    05/25/21 1542  HGBA1C 5.8*   CBG: No results for input(s): GLUCAP in the last 168 hours. Lipid Profile: No results for input(s): CHOL, HDL, LDLCALC, TRIG, CHOLHDL, LDLDIRECT in the last 72 hours. Thyroid Function Tests: Recent Labs    05/25/21 1542 05/26/21 0835  TSH 5.137*  --   FREET4  --  0.90   Anemia Panel: Recent Labs    05/25/21 1542  VITAMINB12 471  FOLATE 4.7*  FERRITIN 1,619*  TIBC 263  IRON 52  RETICCTPCT 1.1   Sepsis Labs: Recent Labs  Lab 05/25/21 1328  LATICACIDVEN 1.7    Recent Results (from the past 240 hour(s))  Resp Panel by RT-PCR (Flu A&B, Covid) Nasopharyngeal Swab     Status: Abnormal   Collection Time: 05/25/21 12:26 PM   Specimen: Nasopharyngeal Swab; Nasopharyngeal(NP) swabs in vial transport medium  Result Value Ref Range Status   SARS Coronavirus 2 by RT PCR NEGATIVE NEGATIVE Final    Comment: (NOTE) SARS-CoV-2 target nucleic acids are NOT DETECTED.  The SARS-CoV-2 RNA is generally detectable in upper respiratory specimens during the acute phase of infection. The lowest concentration of SARS-CoV-2 viral copies this assay can detect is 138 copies/mL. A negative result does not preclude SARS-Cov-2 infection and should not be used as the sole basis for treatment or other patient management decisions. A negative result may occur with  improper specimen collection/handling,  submission of specimen other than nasopharyngeal swab, presence of viral mutation(s) within the areas targeted by this assay, and inadequate number of viral copies(<138 copies/mL). A negative result must be combined with clinical observations, patient history, and epidemiological information. The expected result is Negative.  Fact Sheet for Patients:  EntrepreneurPulse.com.au  Fact Sheet for Healthcare Providers:  IncredibleEmployment.be  This test is no t yet approved or cleared by the [place] FDA and  has been authorized for detection and/or diagnosis of SARS-CoV-2 by FDA under an Emergency Use Authorization (EUA). This EUA will remain  in effect (meaning this test can be used) for the duration of the COVID-19 declaration under Section 564(b)(1) of the Act, 21 U.S.C.section 360bbb-3(b)(1), unless the authorization is terminated  or revoked sooner.       Influenza A by PCR POSITIVE (A) NEGATIVE Final   Influenza B by PCR NEGATIVE NEGATIVE Final    Comment: (NOTE) The Xpert Xpress SARS-CoV-2/FLU/RSV plus assay is intended as an aid in the diagnosis of influenza from Nasopharyngeal swab specimens and should not be used as a sole basis for treatment. Nasal washings and aspirates are unacceptable for Xpert Xpress SARS-CoV-2/FLU/RSV testing.  Fact Sheet for Patients: EntrepreneurPulse.com.au  Fact Sheet for Healthcare Providers: IncredibleEmployment.be  This test is not yet approved or cleared by the [place] FDA and has been authorized for detection and/or diagnosis of SARS-CoV-2 by FDA under an Emergency Use Authorization (EUA). This EUA will remain in effect (meaning this test can be used) for the duration of the COVID-19 declaration under Section 564(b)(1) of the Act, 21 U.S.C. section 360bbb-3(b)(1), unless the authorization is terminated or revoked.  Performed at [facility], [address] [place], [place] [address]   MRSA Next Gen by PCR, Nasal     Status: None   Collection Time: 05/25/21  8:44 PM   Specimen: Nasal Mucosa; Nasal Swab  Result Value Ref Range Status   MRSA by PCR Next Gen NOT DETECTED NOT DETECTED Final    Comment: (NOTE) The GeneXpert MRSA Assay (FDA approved for NASAL specimens only), is one component of a comprehensive MRSA colonization surveillance program. It is not intended to diagnose MRSA infection nor to guide or monitor treatment for MRSA infections. Test performance is not FDA approved in patients less than 69 years old. Performed at [facility], [address], [place], [place] [address]  Urine Culture     Status: Abnormal   Collection Time: 05/26/21  7:59 AM   Specimen: Urine, Catheterized  Result Value Ref Range Status   Specimen Description   Final    URINE, CATHETERIZED Performed at [facility], [address], [place], [place] [address]    Special Requests   Final    NONE Performed at [facility], [address] [place], [place] [address]    Culture MULTIPLE SPECIES PRESENT, SUGGEST RECOLLECTION (A)  Final   Report Status 05/27/2021 FINAL  Final         Radiology Studies: ECHOCARDIOGRAM COMPLETE  Result Date: 05/26/2021    ECHOCARDIOGRAM REPORT   Patient Name:   Chris Simpson Date of Exam: 05/26/2021 Medical Rec #:  [id number]   Height:       68.0 in Accession #:    [id number]  Weight:       139.3 lb Date of Birth:  1931-05-29   BSA:          1.753 m Patient Age:    89 years    BP:           151/77 mmHg Patient Gender: M           HR:           86 bpm. Exam Location:  [facility] Procedure: 2D Echo, Cardiac Doppler and Color Doppler Indications:    Syncope  History:        Patient has prior history of Echocardiogram examinations, most                 recent 01/07/2012. Stroke and COPD; Risk Factors:Hypertension.  Sonographer:    Wenda Low Referring Phys: [id number] [place]  1. Left ventricular ejection  fraction, by estimation, is 60 to 65%. The left ventricle has normal function. The left ventricle has no regional wall motion abnormalities. Left ventricular diastolic parameters were normal.  2. Right ventricular systolic function is normal. The right ventricular size is normal. There is moderately elevated pulmonary artery systolic pressure.  3. The mitral valve is abnormal. Trivial mitral valve regurgitation. No evidence of mitral stenosis.  4. The aortic valve is normal in structure. Aortic valve regurgitation is not visualized. No aortic stenosis is present.  5. The inferior vena cava is normal in size with greater than 50% respiratory variability, suggesting right atrial pressure of 3 mmHg. FINDINGS  Left Ventricle: Left ventricular ejection fraction, by estimation, is 60 to 65%. The left ventricle has normal function. The left ventricle has no regional wall motion abnormalities. The left ventricular internal cavity size was normal in size. There is  no left ventricular hypertrophy. Left ventricular diastolic parameters were normal. Right Ventricle: The right ventricular size is normal. No increase in right ventricular wall thickness. Right ventricular systolic function is normal. There is moderately elevated pulmonary artery systolic pressure. The tricuspid regurgitant velocity is 3.07 m/s, and with an assumed right atrial pressure of 8 mmHg, the estimated right ventricular systolic pressure is 34.1 mmHg. Left Atrium: Left atrial size was normal in size. Right Atrium: Right atrial size was normal in size. Pericardium: There is no evidence of pericardial effusion. Mitral Valve: The mitral valve is abnormal. There is mild thickening of the mitral valve leaflet(s). Trivial mitral valve regurgitation. No evidence of mitral valve stenosis. MV peak gradient, 4.1 mmHg. The mean mitral valve gradient is 2.0 mmHg. Tricuspid Valve: The tricuspid valve is normal in structure. Tricuspid valve regurgitation is not  demonstrated. No evidence of tricuspid stenosis. Aortic Valve: The aortic valve is normal in structure. Aortic valve regurgitation is not visualized. No aortic stenosis is present. Aortic valve mean gradient measures 3.0 mmHg. Aortic valve peak gradient measures 6.0 mmHg. Aortic valve area, by VTI measures 1.91 cm. Pulmonic Valve: The pulmonic valve was normal in structure. Pulmonic valve regurgitation is not visualized. No evidence of pulmonic stenosis. Aorta: The aortic root is normal in size and structure. Venous: The inferior vena cava is normal in size with greater than 50% respiratory variability, suggesting right atrial pressure of 3 mmHg. IAS/Shunts: No atrial level shunt detected by color flow Doppler.  LEFT VENTRICLE PLAX 2D LVIDd:         3.70 cm     Diastology LVIDs:         2.70 cm     LV e' medial:    3.92 cm/s LV PW:         1.00 cm     LV E/e' medial:  22.4 LV IVS:        1.10 cm     LV e' lateral:   6.85 cm/s LVOT diam:     1.80 cm     LV E/e' lateral: 12.8 LV SV:         58 LV SV Index:   33 LVOT Area:     2.54 cm  LV Volumes (MOD) LV vol d, MOD A2C: 38.4 ml LV vol d, MOD A4C: 40.0 ml LV vol s, MOD A2C: 15.2 ml LV vol s, MOD A4C: 15.2 ml LV SV MOD A2C:     23.2 ml LV SV MOD A4C:     40.0 ml LV SV MOD BP:      26.0 ml RIGHT VENTRICLE RV Basal diam:  3.60 cm RV Mid diam:    2.70 cm RV S prime:     10.00 cm/s TAPSE (M-mode): 2.4 cm LEFT ATRIUM             Index        RIGHT ATRIUM           Index LA diam:        3.10 cm 1.77 cm/m   RA Area:     14.50 cm LA Vol (A2C):   51.6 ml 29.44 ml/m  RA Volume:   42.00 ml  23.96 ml/m LA Vol (A4C):   63.4 ml 36.17 ml/m LA Biplane Vol: 61.6 ml 35.15 ml/m  AORTIC VALVE                    PULMONIC VALVE AV Area (Vmax):    2.00 cm     PV Vmax:       1.12 m/s AV Area (Vmean):   1.64 cm     PV Peak grad:  5.0 mmHg AV Area (VTI):     1.91 cm AV Vmax:           122.00 cm/s AV Vmean:          83.200 cm/s AV VTI:            0.301 m AV Peak Grad:      6.0 mmHg AV  Mean Grad:      3.0 mmHg LVOT Vmax:         96.00 cm/s LVOT Vmean:        53.700 cm/s LVOT VTI:          0.226 m LVOT/AV VTI ratio: 0.75  AORTA  Ao Root diam: 2.90 cm MITRAL VALVE               TRICUSPID VALVE MV Area (PHT): 2.63 cm    TR Peak grad:   37.7 mmHg MV Area VTI:   2.13 cm    TR Vmax:        307.00 cm/s MV Peak grad:  4.1 mmHg MV Mean grad:  2.0 mmHg    SHUNTS MV Vmax:       1.01 m/s    Systemic VTI:  0.23 m MV Vmean:      64.8 cm/s   Systemic Diam: 1.80 cm MV Decel Time: 288 msec MV E velocity: 87.80 cm/s MV A velocity: 90.40 cm/s MV E/A ratio:  0.97 Jenkins Rouge MD Electronically signed by Jenkins Rouge MD Signature Date/Time: 05/26/2021/11:58:48 AM    Final         Scheduled Meds:  sodium chloride   Intravenous Once   Chlorhexidine Gluconate Cloth  6 each Topical Daily   feeding supplement (NEPRO CARB STEADY)  237 mL Oral TID BM   levETIRAcetam  500 mg Oral BID   umeclidinium bromide  1 puff Inhalation Daily     LOS: 3 days    Time spent: 35 minutes    Vernida Mcnicholas Darleen Crocker, DO [facility]  If 7PM-7AM, please contact night-coverage [email or web address] 05/28/2021, 10:28 AM

## 2021-05-28 NOTE — TOC Initial Note (Signed)
Transition of Care [facility]) - Initial/Assessment Note    Patient Details  Name: Chris Simpson MRN: [id number] Date of Birth: [birth year]  Transition of Care [facility]) CM/SW Contact:    Chris Lucks, RN Phone Number: 05/28/2021, 10:43 AM  Clinical Narrative:      Patient admitted with acute metabolic encephalopathy. Patient declining refusing blood and dialysis. TOC spoke his nephew -Chris Simpson. He is requesting residential hospice or [place] with hospice care.  Patient lives alone and can not go home. Chris Simpson is currently care for his mother.        Faxed to [facility]. Spoke with hospice. IF MD reviews the chart today they will sent someone out to assess. Maybe be Monday. RN/MD updated.         Expected Discharge Plan: [place] Barriers to Discharge: Continued Medical Work up   Patient Goals and CMS Choice Patient states their goals for this hospitalization and ongoing recovery are:: hospice referral CMS Medicare.gov Compare Post Acute Care list provided to:: Patient Represenative (must comment) Choice offered to / list presented to : Chris Simpson POA / Guardian  Expected Discharge Plan and Services Expected Discharge Plan: [place]     Living arrangements for the past 2 months: Apartment                   Prior Living Arrangements/Services Living arrangements for the past 2 months: Apartment Lives with:: Self Patient language and need for interpreter reviewed:: Yes        Need for Family Participation in Patient Care: Yes (Comment) Care giver support system in place?: Yes (comment) Current home services: DME Criminal Activity/Legal Involvement Pertinent to Current Situation/Hospitalization: No - Comment as needed  Activities of Daily Living Home Assistive Devices/Equipment: Walker (specify type) ADL Screening (condition at time of admission) Patient's cognitive ability adequate to safely complete daily activities?: Yes Is the patient deaf or have  difficulty hearing?: Yes Does the patient have difficulty seeing, even when wearing glasses/contacts?: No Does the patient have difficulty concentrating, remembering, or making decisions?: No Patient able to express need for assistance with ADLs?: Yes Does the patient have difficulty dressing or bathing?: Yes Independently performs ADLs?: No Communication: Independent Dressing (OT): Needs assistance Is this a change from baseline?: Pre-admission baseline Grooming: Needs assistance Is this a change from baseline?: Pre-admission baseline Feeding: Needs assistance (with set up) Is this a change from baseline?: Pre-admission baseline Bathing: Needs assistance Is this a change from baseline?: Pre-admission baseline Toileting: Needs assistance Is this a change from baseline?: Change from baseline, expected to last >3days In/Out Bed: Independent with device (comment) Walks in Home: Independent with device (comment) Does the patient have difficulty walking or climbing stairs?: Yes Weakness of Legs: Both Weakness of Arms/Hands: Both  Permission Sought/Granted      Emotional Assessment      Admission diagnosis:  Hyperkalemia [E87.5] Influenza A [J10.1] Generalized weakness [R53.1] AKI (acute kidney injury) ([place]) [W10.9] Acute metabolic encephalopathy [N23.55] Patient Active Problem List   Diagnosis Date Noted   Acute metabolic encephalopathy 73/22/0254   Hypertension 09/03/2019   COPD exacerbation ([place]) 04/22/2016   COPD (chronic obstructive pulmonary disease) ([place]) 04/22/2016   Chronic obstructive pulmonary disease ([place]) 04/22/2016   Aspiration pneumonitis ([place]) 01/09/2012   Stroke ([place]) 01/07/2012   Hypertensive emergency 01/04/2012   Acute respiratory failure ([place]) 01/04/2012   Seizure ([place]) 01/04/2012   Altered mental status 01/04/2012   Hypokalemia 01/04/2012   Chronic renal insufficiency 01/04/2012   PCP:  Chris Fire, MD Pharmacy:   [facility], [place] [address] Phone: [phone] Fax: [phone]   Readmission Risk Interventions Readmission Risk Prevention Plan 05/28/2021  Transportation Screening Complete  Home Care Screening Complete  Medication Review (RN CM) Complete  Some recent data might be hidden

## 2021-05-29 ENCOUNTER — Encounter (HOSPITAL_COMMUNITY): Payer: Self-pay | Admitting: Internal Medicine

## 2021-05-29 DIAGNOSIS — J101 Influenza due to other identified influenza virus with other respiratory manifestations: Secondary | ICD-10-CM

## 2021-05-29 DIAGNOSIS — Z7189 Other specified counseling: Secondary | ICD-10-CM

## 2021-05-29 DIAGNOSIS — Z515 Encounter for palliative care: Secondary | ICD-10-CM

## 2021-05-29 NOTE — Consult Note (Addendum)
Consultation Note Date: 05/29/2021   Patient Name: Chris Simpson  DOB: [birth year]  MRN: [id number]  Age / Sex: 88 y.o., male  PCP: Rosita Fire, MD Referring Physician: Rodena Goldmann, DO  Reason for Consultation: Establishing goals of care  HPI/Patient Profile: 88 y.o. male  with past medical history of prior CVA, seizures, HTN, history of CKD and ongoing tobacco abuse with likely asthma/COPD, history of acute respiratory failure, living independently with home health aides visiting regularly admitted on 05/25/2021 with acute metabolic encephalopathy likely at baseline, UTI, worsening anemia likely due to chronic disease, acute on chronic kidney disease.   Clinical Assessment and Goals of Care: I have reviewed medical records including EPIC notes, labs and imaging, received report from RN, assessed the patient.  Mr. Chris Simpson is sitting up in bed.  He greets me, making and somewhat keeping eye contact.  He appears acutely/chronically ill and quite frail, with very poor dentition.  He is alert, oriented to person and situation.  I believe that he can make his basic needs known.  There is no family at bedside at this time.  Healthcare surrogate/nephew, Chris Simpson, has elected comfort and dignity at end-of-life, residential hospice.  Transition of care team is working for residential hospice placement. No specific needs noted at this time.   PMT will continue to support holistically.  Conference with attending, bedside nursing staff, transition of care team related to patient condition, needs, goals of care, disposition.   HCPOA  NEXT OF KIN -nephew, Chris Simpson    SUMMARY OF RECOMMENDATIONS   Requesting comfort care, residential hospice   Code Status/Advance Care Planning: DNR -goldenrod form completed and placed on chart.  Symptom Management:  Per hospitalist, no additional needs at this  time.  Palliative Prophylaxis:  Frequent Pain Assessment and Oral Care  Additional Recommendations (Limitations, Scope, Preferences): Requesting hospice care  Psycho-social/Spiritual:  Desire for further Chaplaincy support:no Additional Recommendations: Caregiving  Support/Resources and Education on Hospice  Prognosis:  < 2 weeks anticipated based on decreasing hemoglobin with no desire for transfusion, worsening kidney function, poor functional status, confusion.  Discharge Planning:  Family is requesting comfort and dignity at end-of-life, residential hospice.       Primary Diagnoses: Present on Admission:  Acute metabolic encephalopathy   I have reviewed the medical record, interviewed the patient and family, and examined the patient. The following aspects are pertinent.  Past Medical History:  Diagnosis Date   Acute respiratory failure (HCC)    Altered mental status    Cardiac arrest ([place])    CVA (cerebral infarction)    Hypertension    Hypokalemia    Seizures (HCC)    Social History   Socioeconomic History   Marital status: Single    Spouse name: Not on file   Number of children: Not on file   Years of education: Not on file   Highest education level: Not on file  Occupational History   Not on file  Tobacco Use   Smoking status: Former  Packs/day: 0.30    Types: Cigarettes    Quit date: 11/26/2011    Years since quitting: 9.5   Smokeless tobacco: Never  Substance and Sexual Activity   Alcohol use: No   Drug use: No   Sexual activity: Yes  Other Topics Concern   Not on file  Social History Narrative   Retired right-handed 88 year old AA male lives at home alone; he is not married, has 6 children and a high-school education.  The pt denies the use of recreational drugs and quit drinking alcohol after he had his stroke.  He drinks coffee every day and smokes 1 pack of cigarettes per week.    Social Determinants of Health   Financial Resource Strain:  Not on file  Food Insecurity: Not on file  Transportation Needs: Not on file  Physical Activity: Not on file  Stress: Not on file  Social Connections: Not on file   History reviewed. No pertinent family history. Scheduled Meds:  sodium chloride   Intravenous Once   Chlorhexidine Gluconate Cloth  6 each Topical Daily   feeding supplement (NEPRO CARB STEADY)  237 mL Oral TID BM   levETIRAcetam  500 mg Oral BID   umeclidinium bromide  1 puff Inhalation Daily   Continuous Infusions: PRN Meds:.acetaminophen **OR** acetaminophen, albuterol, glycopyrrolate **OR** glycopyrrolate **OR** glycopyrrolate, HYDROmorphone (DILAUDID) injection, LORazepam **OR** LORazepam **OR** LORazepam, LORazepam, ondansetron **OR** ondansetron (ZOFRAN) IV Medications Prior to Admission:  Prior to Admission medications   Medication Sig Start Date End Date Taking? Authorizing Provider  albuterol (PROVENTIL HFA;VENTOLIN HFA) 108 (90 Base) MCG/ACT inhaler Inhale 2 puffs into the lungs every 6 (six) hours as needed for wheezing or shortness of breath. 04/26/16  Yes Fanta, Brandon Melnick, MD  amLODipine (NORVASC) 10 MG tablet Take 10 mg by mouth daily. 04/19/21  Yes [provider]  aspirin 325 MG tablet Take 325 mg by mouth daily.   Yes [provider]  labetalol (NORMODYNE) 200 MG tablet Take 1 tablet (200 mg total) by mouth 2 (two) times daily. 01/09/12 05/25/21 Yes Oti, Brantley Stage, MD  levETIRAcetam (KEPPRA) 500 MG tablet Take 500 mg by mouth 2 (two) times daily. For seizures 01/09/12 05/25/21 Yes Oti, Brantley Stage, MD  losartan (COZAAR) 50 MG tablet Take 100 mg by mouth daily. 05/17/21  Yes [provider]  labetalol (NORMODYNE) 200 MG tablet Take by mouth. Patient not taking: Reported on 05/25/2021 01/09/12   [provider]  predniSONE (STERAPRED UNI-PAK 21 TAB) 10 MG (21) TBPK tablet 40 mg po daily for 3 days, 30 mg po daily for 3 days, 2 tab po daily for 3 days and 1 tab po daily for 3  days Patient not taking: Reported on 05/25/2021 04/26/16   Rosita Fire, MD  tiotropium (SPIRIVA HANDIHALER) 18 MCG inhalation capsule Place 1 capsule (18 mcg total) into inhaler and inhale every morning. Patient not taking: Reported on 05/25/2021 04/26/16   Rosita Fire, MD   No Known Allergies Review of Systems  Unable to perform ROS: Age   Physical Exam Vitals and nursing note reviewed.  HENT:     Mouth/Throat:     Mouth: Mucous membranes are moist.  Cardiovascular:     Rate and Rhythm: Normal rate.  Pulmonary:     Effort: Pulmonary effort is normal. No respiratory distress.  Skin:    General: Skin is warm and dry.  Neurological:     Comments: Oriented to person and situation  Psychiatric:     Comments: Calm and  cooperative, not fearful    Vital Signs: BP (!) 137/52 (BP Location: Left Arm)    Pulse 72    Temp (!) 97.2 F (36.2 C)    Resp 18    Ht 5\' 8"  (1.727 m)    Wt 66 kg    SpO2 97%    BMI 22.12 kg/m  Pain Scale: 0-10 POSS *See Group Information*: 1-Acceptable,Awake and alert Pain Score: 0-No pain   SpO2: SpO2: 97 % O2 Device:SpO2: 97 % O2 Flow Rate: .   IO: Intake/output summary:  Intake/Output Summary (Last 24 hours) at 05/29/2021 1235 Last data filed at 05/29/2021 0900 Gross per 24 hour  Intake 480 ml  Output 750 ml  Net -270 ml    LBM: Last BM Date: 05/28/21 Baseline Weight: Weight: 74.8 kg Most recent weight: Weight: 66 kg     Palliative Assessment/Data:   Flowsheet Rows    Flowsheet Row Most Recent Value  Intake Tab   Referral Department Hospitalist  Unit at Time of Referral Med/Surg Unit  Palliative Care Primary Diagnosis Neurology  Date Notified 05/25/21  Palliative Care Type New Palliative care  Reason for referral Clarify Goals of Care  Date of Admission 05/25/21  Date first seen by Palliative Care 05/29/21  # of days Palliative referral response time 4 Day(s)  # of days IP prior to Palliative referral 0  Clinical Assessment    Palliative Performance Scale Score 30%  Pain Max last 24 hours Not able to report  Pain Min Last 24 hours Not able to report  Dyspnea Max Last 24 Hours Not able to report  Dyspnea Min Last 24 hours Not able to report  Psychosocial & Spiritual Assessment   Palliative Care Outcomes        Time In: 0950 Time Out: 1030 Time Total: 40 minutes  Greater than 50%  of this time was spent counseling and coordinating care related to the above assessment and plan.  Signed by: Drue Novel, NP   Please contact Palliative Medicine Team phone at [phone] for questions and concerns.  For individual provider: See Shea Evans

## 2021-05-29 NOTE — TOC Progression Note (Signed)
Transition of Care [facility]) - Progression Note    Patient Details  Name: BARTLOMIEJ JENKINSON MRN: [id number] Date of Birth: 02/07/1932  Transition of Care [facility]) CM/SW [place], [place] Phone Number: 05/29/2021, 3:49 PM  Clinical Narrative:    CSW made call to [facility] Message left for call center. CSW awaiting return call as Hospice is closed for today. TOC to follow.   Expected Discharge Plan: [place] Barriers to Discharge: Continued Medical Work up  Expected Discharge Plan and Services Expected Discharge Plan: [place]       Living arrangements for the past 2 months: Apartment                                       Social Determinants of Health (SDOH) Interventions    Readmission Risk Interventions Readmission Risk Prevention Plan 05/28/2021  Transportation Screening Complete  Home Care Screening Complete  Medication Review (RN CM) Complete  Some recent data might be hidden

## 2021-05-29 NOTE — Progress Notes (Signed)
Decision to transition to comfort care noted.  Working on discharge to hospice. Nothing further to add and will sign off.  Please call with questions or concerns.

## 2021-05-29 NOTE — Progress Notes (Signed)
PROGRESS NOTE    Chris Simpson  WIO:[id number] DOB: Jul 01, 1931 DOA: 05/25/2021 PCP: Rosita Fire, MD   Brief Narrative:   Chris Simpson is a 88 y.o. male with medical history significant for prior CVA, seizures, hypertension, likely history of CKD, and ongoing tobacco abuse with likely asthma/COPD who presented to the ED after he was found this morning by his home health aide on the floor.  Per the charts, he apparently lives at home independently and has home health aides who visit him on a regular basis.  He apparently slid out of his chair and lost consciousness and ended up waking up on the floor.  Patient was admitted with acute encephalopathy of uncertain origin.  He was noted to have significant metabolic disturbances with severe hyperkalemia and metabolic acidosis related to what appears to be AKI and advanced CKD.  He is also noted to have influenza A.  He has refused hemodialysis and even blood transfusions at this point and continues to have declining hemoglobin levels.  After further discussion with nephew on 12/31, patient and family are agreeable to comfort care and eventual discharge to hospice facility.  Assessment & Plan:   Principal Problem:   Acute metabolic encephalopathy   Acute encephalopathy-multifactorial -Currently appears at baseline -Appears to have had loss of consciousness and uncertain if syncopal episode versus seizure -Carotid ultrasound with no acute findings and 2D echocardiogram as noted below with no acute findings -TSH 5.137 and free T4 ordered -Seizure work-up with EEG demonstrating some right-sided cortical dysfunction, but no seizure activity, CT head with no acute findings; seizure precautions -Severe hyperkalemia improving -UTI may be causing some of the issues   UTI -Appears to be related to some urinary retention issues and likely BPH -Obtain urine culture -Start Rocephin 12/30   Worsening anemia likely due to chronic disease -Anemia panel  without any signs of iron deficiency -Patient refuses 1 unit PRBC transfusion -Aranesp ordered per nephrology -Continue to monitor -No overt bleeding noted   History of prior CVA/seizures -Continue on Keppra -Checked EEG with no findings of seizure activity -Continue aspirin   AKI on CKD stage IIIb -Uncertain baseline as prior creatinine was near 4.2-6.8 in 3419 -Uncertain component of AKI with poor oral intake at home -Continue IV fluid and Lasix as ordered -CK is 242 -Renal ultrasound with no acute findings -Nephrology following and patient refuses hemodialysis   Severe hyperkalemia secondary to AKI and losartan use-improving -Calcium gluconate, IV insulin, D50, sodium bicarbonate, and IV fluids started in ED -Continue Lasix and IV fluid -EKG without significant changes -Repeat BMP at 1600 per nephrology recommendations -Further recommendations per nephrology   Metabolic acidosis secondary to above -Continue sodium bicarbonate and monitor   Influenza A -Start on Tamiflu with contact precautions -He had noticed decreased appetite recently with poor oral intake likely related to this   Uncontrolled hypertension -Continue home amlodipine and labetalol -IV hydralazine as needed -Hold losartan   Ongoing tobacco abuse -Counseled on cessation   Multiple life prolonging medications discontinued  on 12/31; transitioned to comfort care on 1/1.  Anticipating discharge to hospice facility in the next 24-48 hours     DVT prophylaxis: None Code Status: DNR/comfort care Family Communication: Discussed with Nephew on phone [phone] Disposition Plan:  Status is: Inpatient   Remains inpatient appropriate because: Requires ongoing IV medications.   Consultants:  Nephrology   Procedures:  See below   Antimicrobials:  Rocephin 12/30>>12/31   Subjective: Patient seen and evaluated today with  no new acute complaints or concerns. No acute concerns or events noted  overnight.  Objective: Vitals:   05/28/21 1327 05/28/21 2041 05/29/21 0519 05/29/21 0840  BP: (!) 124/44 137/83 (!) 137/52   Pulse: 78 80 72   Resp: 16 17 18    Temp: 98.9 F (37.2 C) 99 F (37.2 C) (!) 97.2 F (36.2 C)   TempSrc: Oral Oral    SpO2: 98% 96% 98% 97%  Weight:      Height:        Intake/Output Summary (Last 24 hours) at 05/29/2021 1223 Last data filed at 05/29/2021 0900 Gross per 24 hour  Intake 480 ml  Output 750 ml  Net -270 ml   Filed Weights   05/25/21 2104 05/26/21 0433 05/27/21 0600  Weight: 63.7 kg 63.2 kg 66 kg    Examination:  General exam: Appears calm and comfortable  Respiratory system: Clear to auscultation. Respiratory effort normal. Cardiovascular system: S1 & S2 heard, RRR.  Gastrointestinal system: Abdomen is soft Central nervous system: Alert and awake Extremities: No edema Skin: No significant lesions noted Psychiatry: Flat affect.    Data Reviewed: I have personally reviewed following labs and imaging studies  CBC: Recent Labs  Lab 05/25/21 1247 05/25/21 1542 05/26/21 0421 05/26/21 0729 05/27/21 0553  WBC 10.1 8.7 6.0 5.6 5.0  NEUTROABS 7.2  --   --   --   --   HGB 8.2* 7.6* 6.7* 6.6* 6.2*  HCT 26.6* 26.1* 20.8* 19.9* 19.9*  MCV 116.2* 120.3* 111.8* 108.2* 109.9*  PLT 190 178 149* 141* 818*   Basic Metabolic Panel: Recent Labs  Lab 05/25/21 1544 05/25/21 2018 05/26/21 0421 05/26/21 0520 05/26/21 1345 05/27/21 0553  NA 145 142 143  --  142 144  K 6.5* 7.0* 6.1* 6.1* 5.2* 5.3*  CL 120* 119* 114*  --  107 106  CO2 11* 14* 18*  --  23 25  GLUCOSE 149* 116* 141*  --  172* 97  BUN 113* 99* 97*  --  91* 84*  CREATININE 6.72* 5.86* 5.45*  --  5.38* 5.35*  CALCIUM 10.7* 9.1 9.1  --  8.5* 8.5*  MG  --   --  1.5*  --   --  1.3*  PHOS 4.8*  --  3.8  --   --   --    GFR: Estimated Creatinine Clearance: 9.1 mL/min (A) (by C-G formula based on SCr of 5.35 mg/dL (H)). Liver Function Tests: Recent Labs  Lab 05/25/21 1247  05/25/21 1544 05/26/21 0421  AST 26  --   --   ALT 15  --   --   ALKPHOS 76  --   --   BILITOT 0.7  --   --   PROT 9.1*  --   --   ALBUMIN 4.7 4.5 3.6   No results for input(s): LIPASE, AMYLASE in the last 168 hours. No results for input(s): AMMONIA in the last 168 hours. Coagulation Profile: No results for input(s): INR, PROTIME in the last 168 hours. Cardiac Enzymes: Recent Labs  Lab 05/25/21 1247  CKTOTAL 242   BNP (last 3 results) No results for input(s): PROBNP in the last 8760 hours. HbA1C: No results for input(s): HGBA1C in the last 72 hours. CBG: No results for input(s): GLUCAP in the last 168 hours. Lipid Profile: No results for input(s): CHOL, HDL, LDLCALC, TRIG, CHOLHDL, LDLDIRECT in the last 72 hours. Thyroid Function Tests: No results for input(s): TSH, T4TOTAL, FREET4, T3FREE, THYROIDAB in the  last 72 hours. Anemia Panel: No results for input(s): VITAMINB12, FOLATE, FERRITIN, TIBC, IRON, RETICCTPCT in the last 72 hours. Sepsis Labs: Recent Labs  Lab 05/25/21 1328  LATICACIDVEN 1.7    Recent Results (from the past 240 hour(s))  Resp Panel by RT-PCR (Flu A&B, Covid) Nasopharyngeal Swab     Status: Abnormal   Collection Time: 05/25/21 12:26 PM   Specimen: Nasopharyngeal Swab; Nasopharyngeal(NP) swabs in vial transport medium  Result Value Ref Range Status   SARS Coronavirus 2 by RT PCR NEGATIVE NEGATIVE Final    Comment: (NOTE) SARS-CoV-2 target nucleic acids are NOT DETECTED.  The SARS-CoV-2 RNA is generally detectable in upper respiratory specimens during the acute phase of infection. The lowest concentration of SARS-CoV-2 viral copies this assay can detect is 138 copies/mL. A negative result does not preclude SARS-Cov-2 infection and should not be used as the sole basis for treatment or other patient management decisions. A negative result may occur with  improper specimen collection/handling, submission of specimen other than nasopharyngeal swab,  presence of viral mutation(s) within the areas targeted by this assay, and inadequate number of viral copies(<138 copies/mL). A negative result must be combined with clinical observations, patient history, and epidemiological information. The expected result is Negative.  Fact Sheet for Patients:  EntrepreneurPulse.com.au  Fact Sheet for Healthcare Providers:  IncredibleEmployment.be  This test is no t yet approved or cleared by the [place] FDA and  has been authorized for detection and/or diagnosis of SARS-CoV-2 by FDA under an Emergency Use Authorization (EUA). This EUA will remain  in effect (meaning this test can be used) for the duration of the COVID-19 declaration under Section 564(b)(1) of the Act, 21 U.S.C.section 360bbb-3(b)(1), unless the authorization is terminated  or revoked sooner.       Influenza A by PCR POSITIVE (A) NEGATIVE Final   Influenza B by PCR NEGATIVE NEGATIVE Final    Comment: (NOTE) The Xpert Xpress SARS-CoV-2/FLU/RSV plus assay is intended as an aid in the diagnosis of influenza from Nasopharyngeal swab specimens and should not be used as a sole basis for treatment. Nasal washings and aspirates are unacceptable for Xpert Xpress SARS-CoV-2/FLU/RSV testing.  Fact Sheet for Patients: EntrepreneurPulse.com.au  Fact Sheet for Healthcare Providers: IncredibleEmployment.be  This test is not yet approved or cleared by the [place] FDA and has been authorized for detection and/or diagnosis of SARS-CoV-2 by FDA under an Emergency Use Authorization (EUA). This EUA will remain in effect (meaning this test can be used) for the duration of the COVID-19 declaration under Section 564(b)(1) of the Act, 21 U.S.C. section 360bbb-3(b)(1), unless the authorization is terminated or revoked.  Performed at [facility], [address] [place], [place] [address]   MRSA Next Gen by PCR,  Nasal     Status: None   Collection Time: 05/25/21  8:44 PM   Specimen: Nasal Mucosa; Nasal Swab  Result Value Ref Range Status   MRSA by PCR Next Gen NOT DETECTED NOT DETECTED Final    Comment: (NOTE) The GeneXpert MRSA Assay (FDA approved for NASAL specimens only), is one component of a comprehensive MRSA colonization surveillance program. It is not intended to diagnose MRSA infection nor to guide or monitor treatment for MRSA infections. Test performance is not FDA approved in patients less than 70 years old. Performed at [facility], [address] [place], [place] [address]   Urine Culture     Status: Abnormal   Collection Time: 05/26/21  7:59 AM   Specimen: Urine,  Catheterized  Result Value Ref Range Status   Specimen Description   Final    URINE, CATHETERIZED Performed at [facility], [address], [place], [place] [address]    Special Requests   Final    NONE Performed at [facility], [address], [place], [place] [address]    Culture MULTIPLE SPECIES PRESENT, SUGGEST RECOLLECTION (A)  Final   Report Status 05/27/2021 FINAL  Final         Radiology Studies: No results found.      Scheduled Meds:  sodium chloride   Intravenous Once   Chlorhexidine Gluconate Cloth  6 each Topical Daily   feeding supplement (NEPRO CARB STEADY)  237 mL Oral TID BM   levETIRAcetam  500 mg Oral BID   umeclidinium bromide  1 puff Inhalation Daily     LOS: 4 days    Time spent: 35 minutes    Lynx Goodrich Darleen Crocker, DO [facility]  If 7PM-7AM, please contact night-coverage [email or web address] 05/29/2021, 12:23 PM

## 2021-05-30 LAB — BASIC METABOLIC PANEL
Anion gap: 12 (ref 5–15)
BUN: 79 mg/dL — ABNORMAL HIGH (ref 8–23)
CO2: 23 mmol/L (ref 22–32)
Calcium: 9 mg/dL (ref 8.9–10.3)
Chloride: 105 mmol/L (ref 98–111)
Creatinine, Ser: 5.49 mg/dL — ABNORMAL HIGH (ref 0.61–1.24)
GFR, Estimated: 9 mL/min — ABNORMAL LOW (ref >60)
Glucose, Bld: 145 mg/dL — ABNORMAL HIGH (ref 70–99)
Potassium: 4.4 mmol/L (ref 3.5–5.1)
Sodium: 140 mmol/L (ref 135–145)

## 2021-05-30 LAB — CBC
HCT: 19.7 % — ABNORMAL LOW (ref 39.0–52.0)
Hemoglobin: 6.3 g/dL — CL (ref 13.0–17.0)
MCH: 36 pg — ABNORMAL HIGH (ref 26.0–34.0)
MCHC: 32 g/dL (ref 30.0–36.0)
MCV: 112.6 fL — ABNORMAL HIGH (ref 80.0–100.0)
Platelets: 156 10*3/uL (ref 150–400)
RBC: 1.75 MIL/uL — ABNORMAL LOW (ref 4.22–5.81)
RDW: 16.7 % — ABNORMAL HIGH (ref 11.5–15.5)
WBC: 5.4 10*3/uL (ref 4.0–10.5)
nRBC: 0.7 % — ABNORMAL HIGH (ref 0.0–0.2)

## 2021-05-30 NOTE — Progress Notes (Signed)
[facility]  [facility]) Hospital Liaison Note  Received request from [place] for family interest in [facility] with request for transfer.   Chart reviewed and unfortunately this patient is not appropriate for [facility]. Please call with questions.     Buck Mam [facility] : [facility] Liaison  [phone]

## 2021-05-30 NOTE — NC FL2 (Signed)
[place] LEVEL OF CARE SCREENING TOOL     IDENTIFICATION  Patient Name: Chris Simpson Birthdate: [birth year] Sex: male Admission Date (Current Location): 05/25/2021  [facility] and [place] Number:  [employer] and Address:  [facility] [address], [place]      Provider Number: [id number]  Attending Physician Name and Address:  Rodena Goldmann, DO  Relative Name and Phone Number:       Current Level of Care: Hospital Recommended Level of Care: [facility] Prior Approval Number:    Date Approved/Denied:   PASRR Number: [id number]  Discharge Plan: SNF    Current Diagnoses: Patient Active Problem List   Diagnosis Date Noted   Acute metabolic encephalopathy 36/64/4034   Hypertension 09/03/2019   COPD exacerbation ([place]) 04/22/2016   COPD (chronic obstructive pulmonary disease) ([place]) 04/22/2016   Chronic obstructive pulmonary disease ([place]) 04/22/2016   Aspiration pneumonitis ([place]) 01/09/2012   Stroke ([place]) 01/07/2012   Hypertensive emergency 01/04/2012   Acute respiratory failure ([place]) 01/04/2012   Seizure ([place]) 01/04/2012   Altered mental status 01/04/2012   Hypokalemia 01/04/2012   Chronic renal insufficiency 01/04/2012    Orientation RESPIRATION BLADDER Height & Weight     Self, Place  Normal Incontinent Weight: 66 kg Height:  5\' 8"  (172.7 cm)  BEHAVIORAL SYMPTOMS/MOOD NEUROLOGICAL BOWEL NUTRITION STATUS      Incontinent Diet  AMBULATORY STATUS COMMUNICATION OF NEEDS Skin   Extensive Assist Verbally Normal                       Personal Care Assistance Level of Assistance  Bathing, Dressing, Total care Bathing Assistance: Maximum assistance   Dressing Assistance: Maximum assistance Total Care Assistance: Maximum assistance   Functional Limitations Info             SPECIAL CARE FACTORS FREQUENCY  PT (By licensed PT), OT (By licensed OT)     PT Frequency: 5x weekly OT Frequency: 5x  weekly            Contractures Contractures Info: Not present    Additional Factors Info  Code Status, Allergies Code Status Info: DNR Allergies Info: NKDA           Current Medications (05/30/2021):  This is the current hospital active medication list Current Facility-Administered Medications  Medication Dose Route Frequency Provider Last Rate Last Admin   0.9 %  sodium chloride infusion (Manually program via Guardrails IV Fluids)   Intravenous Once Manuella Ghazi, Pratik D, DO       acetaminophen (TYLENOL) tablet 650 mg  650 mg Oral Q6H PRN Manuella Ghazi, Pratik D, DO       Or   acetaminophen (TYLENOL) suppository 650 mg  650 mg Rectal Q6H PRN Manuella Ghazi, Pratik D, DO       albuterol (VENTOLIN HFA) 108 (90 Base) MCG/ACT inhaler 2 puff  2 puff Inhalation Q6H PRN Manuella Ghazi, Pratik D, DO       Chlorhexidine Gluconate Cloth 2 % PADS 6 each  6 each Topical Daily Manuella Ghazi, Pratik D, DO   6 each at 05/30/21 0955   feeding supplement (NEPRO CARB STEADY) liquid 237 mL  237 mL Oral TID BM Manuella Ghazi, Pratik D, DO 0 mL/hr at 05/28/21 2140 237 mL at 05/30/21 0955   glycopyrrolate (ROBINUL) tablet 1 mg  1 mg Oral Q4H PRN Manuella Ghazi, Pratik D, DO       Or   glycopyrrolate (ROBINUL) injection 0.2 mg  0.2 mg Subcutaneous Q4H PRN Manuella Ghazi, Pratik D, DO       Or   glycopyrrolate (ROBINUL) injection 0.2 mg  0.2 mg Intravenous Q4H PRN Manuella Ghazi, Pratik D, DO       HYDROmorphone (DILAUDID) injection 0.5 mg  0.5 mg Intravenous Q2H PRN Manuella Ghazi, Pratik D, DO       levETIRAcetam (KEPPRA) tablet 500 mg  500 mg Oral BID Manuella Ghazi, Pratik D, DO   500 mg at 05/30/21 2883   LORazepam (ATIVAN) tablet 1 mg  1 mg Oral Q4H PRN Heath Lark D, DO   1 mg at 05/29/21 2158   Or   LORazepam (ATIVAN) 2 MG/ML concentrated solution 1 mg  1 mg Sublingual Q4H PRN Manuella Ghazi, Pratik D, DO       Or   LORazepam (ATIVAN) injection 1 mg  1 mg Intravenous Q4H PRN Manuella Ghazi, Pratik D, DO       LORazepam (ATIVAN) injection 2 mg  2 mg Intravenous Q4H PRN Manuella Ghazi, Pratik D, DO       ondansetron (ZOFRAN)  tablet 4 mg  4 mg Oral Q6H PRN Manuella Ghazi, Pratik D, DO       Or   ondansetron (ZOFRAN) injection 4 mg  4 mg Intravenous Q6H PRN Manuella Ghazi, Pratik D, DO       umeclidinium bromide (INCRUSE ELLIPTA) 62.5 MCG/ACT 1 puff  1 puff Inhalation Daily Manuella Ghazi, Pratik D, DO   1 puff at 05/27/21 3744     Discharge Medications: Please see discharge summary for a list of discharge medications.  Relevant Imaging Results:  Relevant Lab Results:   Additional Information SSN [id number]  Joaquin Courts, RN

## 2021-05-30 NOTE — Progress Notes (Signed)
PROGRESS NOTE    Chris Simpson  NWG:[id number] DOB: [birth year] DOA: 05/25/2021 PCP: Rosita Fire, MD   Brief Narrative:   Chris Simpson is a 88 y.o. male with medical history significant for prior CVA, seizures, hypertension, likely history of CKD, and ongoing tobacco abuse with likely asthma/COPD who presented to the ED after he was found this morning by his home health aide on the floor.  Per the charts, he apparently lives at home independently and has home health aides who visit him on a regular basis.  He apparently slid out of his chair and lost consciousness and ended up waking up on the floor.  Patient was admitted with acute encephalopathy of uncertain origin.  He was noted to have significant metabolic disturbances with severe hyperkalemia and metabolic acidosis related to what appears to be AKI and advanced CKD.  He is also noted to have influenza A.  He has refused hemodialysis and even blood transfusions at this point and continues to have declining hemoglobin levels.  After further discussion with nephew on 12/31, patient and family are agreeable to comfort care and eventual discharge to hospice facility.  Assessment & Plan:   Principal Problem:   Acute metabolic encephalopathy   Acute encephalopathy-multifactorial -Currently appears at baseline -Appears to have had loss of consciousness and uncertain if syncopal episode versus seizure -Carotid ultrasound with no acute findings and 2D echocardiogram as noted below with no acute findings -TSH 5.137 and free T4 ordered -Seizure work-up with EEG demonstrating some right-sided cortical dysfunction, but no seizure activity, CT head with no acute findings; seizure precautions -May have been related to UTI   UTI -Appears to be related to some urinary retention issues and likely BPH -Obtain urine culture -Rocephin discontinued now that he is comfort care   Worsening anemia likely due to chronic disease -Anemia panel without any  signs of iron deficiency -Patient refuses 1 unit PRBC transfusion -Aranesp ordered per nephrology -Continue to monitor -No overt bleeding noted -Repeat CBC demonstrates stability   History of prior CVA/seizures -Continue on Keppra -Checked EEG with no findings of seizure activity   AKI on CKD stage IIIb -Uncertain baseline as prior creatinine was near 5.7-8.4 in 6962 -Uncertain component of AKI with poor oral intake at home -Continue IV fluid and Lasix as ordered -CK is 242 -Renal ultrasound with no acute findings -Nephrology following and patient refuses hemodialysis   Severe hyperkalemia secondary to AKI and losartan use-improving -Calcium gluconate, IV insulin, D50, sodium bicarbonate, and IV fluids started in ED -Continue Lasix and IV fluid -EKG without significant changes -Repeat BMP at 1600 per nephrology recommendations -Further recommendations per nephrology   Metabolic acidosis secondary to above -Currently improved -Continue sodium bicarbonate and monitor   Influenza A   Uncontrolled hypertension-stable -Blood pressure agents held now that he is comfort care   Ongoing tobacco abuse   Multiple life prolonging medications discontinued  on 12/31; transitioned to comfort care on 1/1.  Anticipating discharge to hospice facility in the next 24-48 hours after hospice RN evaluation.     DVT prophylaxis: None Code Status: DNR/comfort care Family Communication: Discussed with Nephew on phone [phone] Disposition Plan:  Status is: Inpatient   Remains inpatient appropriate because: Requires ongoing IV medications.   Consultants:  Nephrology   Procedures:  See below   Antimicrobials:  Rocephin 12/30>>12/31  Subjective: Patient seen and evaluated today with no new acute complaints or concerns. No acute concerns or events noted overnight.  Objective: Vitals:  05/29/21 2008 05/30/21 0500 05/30/21 0912 05/30/21 1403  BP: 134/76 (!) 155/64  (!) 150/66   Pulse: 71 74  70  Resp: 18 18  18   Temp: 97.6 F (36.4 C) 97.7 F (36.5 C)  97.6 F (36.4 C)  TempSrc: Oral Oral  Oral  SpO2: 96% 96% 96% 96%  Weight:      Height:        Intake/Output Summary (Last 24 hours) at 05/30/2021 1508 Last data filed at 05/30/2021 1300 Gross per 24 hour  Intake 720 ml  Output --  Net 720 ml   Filed Weights   05/25/21 2104 05/26/21 0433 05/27/21 0600  Weight: 63.7 kg 63.2 kg 66 kg    Examination:  General exam: Appears calm and comfortable  Respiratory system: Clear to auscultation. Respiratory effort normal. Cardiovascular system: S1 & S2 heard, RRR.  Gastrointestinal system: Abdomen is soft Central nervous system: Alert and awake Extremities: No edema Skin: No significant lesions noted Psychiatry: Flat affect.    Data Reviewed: I have personally reviewed following labs and imaging studies  CBC: Recent Labs  Lab 05/25/21 1247 05/25/21 1542 05/26/21 0421 05/26/21 0729 05/27/21 0553 05/30/21 1136  WBC 10.1 8.7 6.0 5.6 5.0 5.4  NEUTROABS 7.2  --   --   --   --   --   HGB 8.2* 7.6* 6.7* 6.6* 6.2* 6.3*  HCT 26.6* 26.1* 20.8* 19.9* 19.9* 19.7*  MCV 116.2* 120.3* 111.8* 108.2* 109.9* 112.6*  PLT 190 178 149* 141* 132* 423   Basic Metabolic Panel: Recent Labs  Lab 05/25/21 1544 05/25/21 2018 05/26/21 0421 05/26/21 0520 05/26/21 1345 05/27/21 0553 05/30/21 1136  NA 145 142 143  --  142 144 140  K 6.5* 7.0* 6.1* 6.1* 5.2* 5.3* 4.4  CL 120* 119* 114*  --  107 106 105  CO2 11* 14* 18*  --  23 25 23   GLUCOSE 149* 116* 141*  --  172* 97 145*  BUN 113* 99* 97*  --  91* 84* 79*  CREATININE 6.72* 5.86* 5.45*  --  5.38* 5.35* 5.49*  CALCIUM 10.7* 9.1 9.1  --  8.5* 8.5* 9.0  MG  --   --  1.5*  --   --  1.3*  --   PHOS 4.8*  --  3.8  --   --   --   --    GFR: Estimated Creatinine Clearance: 8.8 mL/min (A) (by C-G formula based on SCr of 5.49 mg/dL (H)). Liver Function Tests: Recent Labs  Lab 05/25/21 1247 05/25/21 1544  05/26/21 0421  AST 26  --   --   ALT 15  --   --   ALKPHOS 76  --   --   BILITOT 0.7  --   --   PROT 9.1*  --   --   ALBUMIN 4.7 4.5 3.6   No results for input(s): LIPASE, AMYLASE in the last 168 hours. No results for input(s): AMMONIA in the last 168 hours. Coagulation Profile: No results for input(s): INR, PROTIME in the last 168 hours. Cardiac Enzymes: Recent Labs  Lab 05/25/21 1247  CKTOTAL 242   BNP (last 3 results) No results for input(s): PROBNP in the last 8760 hours. HbA1C: No results for input(s): HGBA1C in the last 72 hours. CBG: No results for input(s): GLUCAP in the last 168 hours. Lipid Profile: No results for input(s): CHOL, HDL, LDLCALC, TRIG, CHOLHDL, LDLDIRECT in the last 72 hours. Thyroid Function Tests: No results for input(s): TSH,  T4TOTAL, FREET4, T3FREE, THYROIDAB in the last 72 hours. Anemia Panel: No results for input(s): VITAMINB12, FOLATE, FERRITIN, TIBC, IRON, RETICCTPCT in the last 72 hours. Sepsis Labs: Recent Labs  Lab 05/25/21 1328  LATICACIDVEN 1.7    Recent Results (from the past 240 hour(s))  Resp Panel by RT-PCR (Flu A&B, Covid) Nasopharyngeal Swab     Status: Abnormal   Collection Time: 05/25/21 12:26 PM   Specimen: Nasopharyngeal Swab; Nasopharyngeal(NP) swabs in vial transport medium  Result Value Ref Range Status   SARS Coronavirus 2 by RT PCR NEGATIVE NEGATIVE Final    Comment: (NOTE) SARS-CoV-2 target nucleic acids are NOT DETECTED.  The SARS-CoV-2 RNA is generally detectable in upper respiratory specimens during the acute phase of infection. The lowest concentration of SARS-CoV-2 viral copies this assay can detect is 138 copies/mL. A negative result does not preclude SARS-Cov-2 infection and should not be used as the sole basis for treatment or other patient management decisions. A negative result may occur with  improper specimen collection/handling, submission of specimen other than nasopharyngeal swab, presence of  viral mutation(s) within the areas targeted by this assay, and inadequate number of viral copies(<138 copies/mL). A negative result must be combined with clinical observations, patient history, and epidemiological information. The expected result is Negative.  Fact Sheet for Patients:  EntrepreneurPulse.com.au  Fact Sheet for Healthcare Providers:  IncredibleEmployment.be  This test is no t yet approved or cleared by the [place] FDA and  has been authorized for detection and/or diagnosis of SARS-CoV-2 by FDA under an Emergency Use Authorization (EUA). This EUA will remain  in effect (meaning this test can be used) for the duration of the COVID-19 declaration under Section 564(b)(1) of the Act, 21 U.S.C.section 360bbb-3(b)(1), unless the authorization is terminated  or revoked sooner.       Influenza A by PCR POSITIVE (A) NEGATIVE Final   Influenza B by PCR NEGATIVE NEGATIVE Final    Comment: (NOTE) The Xpert Xpress SARS-CoV-2/FLU/RSV plus assay is intended as an aid in the diagnosis of influenza from Nasopharyngeal swab specimens and should not be used as a sole basis for treatment. Nasal washings and aspirates are unacceptable for Xpert Xpress SARS-CoV-2/FLU/RSV testing.  Fact Sheet for Patients: EntrepreneurPulse.com.au  Fact Sheet for Healthcare Providers: IncredibleEmployment.be  This test is not yet approved or cleared by the [place] FDA and has been authorized for detection and/or diagnosis of SARS-CoV-2 by FDA under an Emergency Use Authorization (EUA). This EUA will remain in effect (meaning this test can be used) for the duration of the COVID-19 declaration under Section 564(b)(1) of the Act, 21 U.S.C. section 360bbb-3(b)(1), unless the authorization is terminated or revoked.  Performed at [facility], [address], [place], [place] [address]   MRSA Next Gen by PCR, Nasal      Status: None   Collection Time: 05/25/21  8:44 PM   Specimen: Nasal Mucosa; Nasal Swab  Result Value Ref Range Status   MRSA by PCR Next Gen NOT DETECTED NOT DETECTED Final    Comment: (NOTE) The GeneXpert MRSA Assay (FDA approved for NASAL specimens only), is one component of a comprehensive MRSA colonization surveillance program. It is not intended to diagnose MRSA infection nor to guide or monitor treatment for MRSA infections. Test performance is not FDA approved in patients less than 25 years old. Performed at [facility], [address] [place], [place] [address]   Urine Culture     Status: Abnormal   Collection Time: 05/26/21  7:59 AM   Specimen: Urine, Catheterized  Result Value Ref Range Status   Specimen Description   Final    URINE, CATHETERIZED Performed at [facility], [address] [place], [place] [address]    Special Requests   Final    NONE Performed at [facility], [address], [place], [place] [address]    Culture MULTIPLE SPECIES PRESENT, SUGGEST RECOLLECTION (A)  Final   Report Status 05/27/2021 FINAL  Final         Radiology Studies: No results found.      Scheduled Meds:  sodium chloride   Intravenous Once   Chlorhexidine Gluconate Cloth  6 each Topical Daily   feeding supplement (NEPRO CARB STEADY)  237 mL Oral TID BM   levETIRAcetam  500 mg Oral BID   umeclidinium bromide  1 puff Inhalation Daily     LOS: 5 days    Time spent: 35 minutes    Arienna Benegas Darleen Crocker, DO [facility]  If 7PM-7AM, please contact night-coverage [email or web address] 05/30/2021, 3:08 PM

## 2021-05-30 NOTE — Progress Notes (Signed)
PT Cancellation Note  Patient Details Name: RAIHAN KIMMEL MRN: [id number] DOB: Oct 13, 1931   Cancelled Treatment:    Reason Eval/Treat Not Completed: Other (comment) (pt pursuing hospice/comfort measures). Per chart review pt pursing hospice/comfort measures. DO confirms, PT will sign off at this time. Please re-consult if needs arise.    Talbot Grumbling PT, DPT 05/30/21, 10:58 AM

## 2021-05-30 NOTE — Progress Notes (Signed)
MD notified of hgb 6.3 no new orders given.

## 2021-05-30 NOTE — TOC Progression Note (Signed)
Transition of Care [facility]) - Progression Note    Patient Details  Name: CYRUSS ARATA MRN: [id number] Date of Birth: 09-Nov-1931  [facility]) CM/SW Contact  Joaquin Courts, RN Phone Number: 05/30/2021, 4:02 PM  Clinical Narrative:    CM spoke with rep for [facility] and [facility].  Per reps; patient is not a candidate for residential hospice placement at this time though that could change in the future.  Discussed this informations with patient's nephew [name] and discussed alternative placement options.  [name] would like to explore SNF placement with palliative services following.  FL2 completed and faxed out to area snf for bed offers.   Expected Discharge Plan: [place] Barriers to Discharge: Hospice Bed not available  Expected Discharge Plan and Services Expected Discharge Plan: [place]       Living arrangements for the past 2 months: Apartment                                       Social Determinants of Health (SDOH) Interventions    Readmission Risk Interventions Readmission Risk Prevention Plan 05/28/2021  Transportation Screening Complete  Home Care Screening Complete  Medication Review (RN CM) Complete  Some recent data might be hidden

## 2021-05-31 DIAGNOSIS — Z7401 Bed confinement status: Secondary | ICD-10-CM | POA: Diagnosis not present

## 2021-05-31 DIAGNOSIS — R279 Unspecified lack of coordination: Secondary | ICD-10-CM | POA: Diagnosis not present

## 2021-05-31 DIAGNOSIS — G9341 Metabolic encephalopathy: Secondary | ICD-10-CM | POA: Diagnosis not present

## 2021-05-31 DIAGNOSIS — R2689 Other abnormalities of gait and mobility: Secondary | ICD-10-CM | POA: Diagnosis not present

## 2021-05-31 DIAGNOSIS — J449 Chronic obstructive pulmonary disease, unspecified: Secondary | ICD-10-CM | POA: Diagnosis not present

## 2021-05-31 DIAGNOSIS — J101 Influenza due to other identified influenza virus with other respiratory manifestations: Secondary | ICD-10-CM | POA: Diagnosis not present

## 2021-05-31 DIAGNOSIS — M6281 Muscle weakness (generalized): Secondary | ICD-10-CM | POA: Diagnosis not present

## 2021-05-31 DIAGNOSIS — R5381 Other malaise: Secondary | ICD-10-CM | POA: Diagnosis not present

## 2021-05-31 DIAGNOSIS — N179 Acute kidney failure, unspecified: Secondary | ICD-10-CM | POA: Diagnosis not present

## 2021-05-31 DIAGNOSIS — Z741 Need for assistance with personal care: Secondary | ICD-10-CM | POA: Diagnosis not present

## 2021-05-31 DIAGNOSIS — I10 Essential (primary) hypertension: Secondary | ICD-10-CM | POA: Diagnosis not present

## 2021-05-31 DIAGNOSIS — R627 Adult failure to thrive: Secondary | ICD-10-CM | POA: Diagnosis not present

## 2021-05-31 DIAGNOSIS — N1832 Chronic kidney disease, stage 3b: Secondary | ICD-10-CM | POA: Diagnosis not present

## 2021-05-31 MED ORDER — ACETAMINOPHEN 325 MG PO TABS: 650.0000 mg | ORAL_TABLET | Freq: Four times a day (QID) | ORAL | 0 refills | Status: AC | PRN

## 2021-05-31 MED ORDER — NEPRO/CARBSTEADY PO LIQD: 237.0000 mL | Freq: Three times a day (TID) | ORAL | 0 refills | Status: AC

## 2021-05-31 MED ORDER — GLYCOPYRROLATE 1 MG PO TABS: 1.0000 mg | ORAL_TABLET | ORAL | 0 refills | Status: AC | PRN

## 2021-05-31 NOTE — Progress Notes (Signed)
Nutrition Brief Note  Chart reviewed. Patient transition to comfort care on 1/1.   Hospice consulted- pt not appropriate for [facility] at this time per [facility].    SNF at discharge?  Patient is consuming 50-75% of most meals and has ONS ordered BID. Diet liberalized from Renal 1200 ml fluid restriction to Low sodium diet on 1/4.  Labs: BMP Latest Ref Rng & Units 05/30/2021 05/27/2021 05/26/2021  Glucose 70 - 99 mg/dL 145(H) 97 172(H)  BUN 8 - 23 mg/dL 79(H) 84(H) 91(H)  Creatinine 0.61 - 1.24 mg/dL 5.49(H) 5.35(H) 5.38(H)  Sodium 135 - 145 mmol/L 140 144 142  Potassium 3.5 - 5.1 mmol/L 4.4 5.3(H) 5.2(H)  Chloride 98 - 111 mmol/L 105 106 107  CO2 22 - 32 mmol/L 23 25 23   Calcium 8.9 - 10.3 mg/dL 9.0 8.5(L) 8.5(L)    No recommended changes to nutrition care.  Colman Cater MS,RD,CSG,LDN Contact: Shea Evans

## 2021-05-31 NOTE — Progress Notes (Signed)
Report given to [facility] at [facility]

## 2021-05-31 NOTE — Progress Notes (Signed)
Physical Therapy Treatment Patient Details Name: Chris Simpson MRN: [id number] DOB: 01-19-1932 Today's Date: 05/31/2021   History of Present Illness Chris Simpson is a 88 y.o. male with medical history significant for prior CVA, seizures, hypertension, likely history of CKD, and ongoing tobacco abuse with likely asthma/COPD who presented to the ED after he was found this morning by his home health aide on the floor.  Per the charts, he apparently lives at home independently and has home health aides who visit him on a regular basis.  He apparently slid out of his chair and lost consciousness and ended up waking up on the floor.  It is uncertain whether or not he has had any seizures, but there does not appear to be any tongue biting or incontinence.  He states that he has been compliant with his home medications.  Apparently he has not been eating or drinking very much and has not felt "right."  Home health aide had spoken with the patient's nephew who states that he has not been able to get in touch with the patient for the last couple weeks.  It is difficult to obtain any further history from the patient as his speech is incomprehensible and he appears otherwise confused.    PT Comments    Patient demonstrates slow labored movement for rolling side to side and sitting up from side lying position due to generalized weakness, once seated able to maintain fair/good sitting balance and tolerated some BLE ROM exercises with fair carryover and frequent verbal cueing.  Patient unable to stand using RW and limited to standing using both hands on single point caine for a few seconds before having to sit due to c/o increasing low back pain.  Patient demonstrates fair/good return for scooting laterally and forward at bedside and declined to transfer to chair due to back pain.  Patient required Mod assistance to reposition when put back to bed.  Patient will benefit from continued skilled physical therapy in hospital and  recommended venue below to increase strength, balance, endurance for safe ADLs and gait.    Recommendations for follow up therapy are one component of a multi-disciplinary discharge planning process, led by the attending physician.  Recommendations may be updated based on patient status, additional functional criteria and insurance authorization.  Follow Up Recommendations  Skilled nursing-short term rehab (<3 hours/day)     Assistance Recommended at Discharge Intermittent Supervision/Assistance  Patient can return home with the following A lot of help with walking and/or transfers;A lot of help with bathing/dressing/bathroom;Assistance with cooking/housework   Equipment Recommendations  Rolling walker (2 wheels)    Recommendations for Other Services       Precautions / Restrictions Precautions Precautions: Fall Restrictions Weight Bearing Restrictions: No     Mobility  Bed Mobility Overal bed mobility: Needs Assistance Bed Mobility: Rolling;Sidelying to Sit;Sit to Sidelying Rolling: Min assist;Mod assist Sidelying to sit: Mod assist     Sit to sidelying: Mod assist General bed mobility comments: increased time, labored movement    Transfers Overall transfer level: Needs assistance Equipment used: Straight cane;Rolling walker (2 wheels) Transfers: Sit to/from Stand Sit to Stand: Mod assist           General transfer comment: patient requested to use SPC, able to stand for a few seconds, but had to sit due to increasing low back pain, unable to stand using RW mostly due to back pain    Ambulation/Gait  Stairs             Wheelchair Mobility    Modified Rankin (Stroke Patients Only)       Balance Overall balance assessment: Needs assistance Sitting-balance support: Feet supported;No upper extremity supported Sitting balance-Leahy Scale: Fair Sitting balance - Comments: fair/good seated at EOB   Standing balance support:  Reliant on assistive device for balance;During functional activity;Single extremity supported;Bilateral upper extremity supported Standing balance-Leahy Scale: Poor Standing balance comment: using SPC and RW                            Cognition Arousal/Alertness: Awake/alert Behavior During Therapy: WFL for tasks assessed/performed Overall Cognitive Status: Within Functional Limits for tasks assessed                                          Exercises General Exercises - Lower Extremity Ankle Circles/Pumps: Seated;AROM;Strengthening;Both;5 reps Long Arc Quad: Seated;AROM;Strengthening;Both;10 reps    General Comments        Pertinent Vitals/Pain Pain Assessment: Faces Faces Pain Scale: Hurts even more Pain Location: low back Pain Descriptors / Indicators: Aching;Sore;Grimacing Pain Intervention(s): Limited activity within patient's tolerance;Monitored during session;Repositioned    Home Living                          Prior Function            PT Goals (current goals can now be found in the care plan section) Acute Rehab PT Goals Patient Stated Goal: return home with home aides to assist PT Goal Formulation: With patient Time For Goal Achievement: 06/09/21 Potential to Achieve Goals: Good Progress towards PT goals: Progressing toward goals    Frequency    Min 3X/week      PT Plan Current plan remains appropriate    Co-evaluation              AM-PAC PT "6 Clicks" Mobility   Outcome Measure  Help needed turning from your back to your side while in a flat bed without using bedrails?: A Lot Help needed moving from lying on your back to sitting on the side of a flat bed without using bedrails?: A Lot Help needed moving to and from a bed to a chair (including a wheelchair)?: A Lot Help needed standing up from a chair using your arms (e.g., wheelchair or bedside chair)?: A Lot Help needed to walk in hospital room?: A  Lot Help needed climbing 3-5 steps with a railing? : Total 6 Click Score: 11    End of Session   Activity Tolerance: Patient tolerated treatment well;Patient limited by fatigue;Patient limited by pain Patient left: in bed;with call bell/phone within reach;with bed alarm set Nurse Communication: Mobility status PT Visit Diagnosis: Unsteadiness on feet (R26.81);Other abnormalities of gait and mobility (R26.89);Muscle weakness (generalized) (M62.81)     Time: 1610-9604 PT Time Calculation (min) (ACUTE ONLY): 36 min  Charges:  $Therapeutic Exercise: 8-22 mins $Therapeutic Activity: 23-37 mins                     12:16 PM, 05/31/21 Lonell Grandchild, MPT Physical Therapist with [facility] [phone] office [phone] mobile phone

## 2021-05-31 NOTE — Discharge Summary (Signed)
Physician Discharge Summary [facility]    Patient: Chris Simpson                   Admit date: 05/25/2021   DOB: 1932-04-08             Discharge date:05/31/2021/11:29 AM KGU:[id number]                          PCP: Rosita Fire, MD  Disposition:   SNF -with palliative care  Recommendations for Outpatient Follow-up:   Follow up: Closely with palliative care  Discharge Condition: Stable   Code Status:   Code Status: DNR  Diet recommendation: Regular healthy diet   Discharge Diagnoses:    Principal Problem:   Acute metabolic encephalopathy   History of Present Illness/ Hospital Course Chris Simpson Summary:    Chris Simpson is a 88 y.o. male with medical history significant for prior CVA, seizures, hypertension, likely history of CKD, and ongoing tobacco abuse with likely asthma/COPD who presented to the ED after he was found this morning by his home health aide on the floor.  Per the charts, he apparently lives at home independently and has home health aides who visit him on a regular basis.  He apparently slid out of his chair and lost consciousness and ended up waking up on the floor.  Patient was admitted with acute encephalopathy of uncertain origin.  He was noted to have significant metabolic disturbances with severe hyperkalemia and metabolic acidosis related to what appears to be AKI and advanced CKD.  He is also noted to have influenza A.  He has refused hemodialysis and even blood transfusions at this point and continues to have declining hemoglobin levels.  After further discussion with nephew on 12/31, patient and family are agreeable to comfort care and eventual discharge to hospice facility.     Acute encephalopathy-multifactorial -Back to baseline, stable -Appears to have had loss of consciousness and uncertain if syncopal episode versus seizure -Carotid ultrasound with no acute findings and 2D echocardiogram as noted below with no acute findings -TSH 5.137 and  free T4 ordered -Seizure work-up with EEG demonstrating some right-sided cortical dysfunction, but no seizure activity, CT head with no acute findings; seizure precautions -May have been related to UTI   UTI -Appears to be related to some urinary retention issues and likely BPH -Obtain urine culture -Rocephin discontinued now that he is comfort care   Worsening anemia likely due to chronic disease -Anemia panel without any signs of iron deficiency -Patient refuses 1 unit PRBC transfusion -Aranesp ordered per nephrology -Continue to monitor -No overt bleeding noted -Repeat CBC demonstrates stability   History of prior CVA/seizures -Continue on Keppra -Checked EEG with no findings of seizure activity   AKI on CKD stage IIIb -Uncertain baseline as prior creatinine was near 6.2-8.3 in 1517 -Uncertain component of AKI with poor oral intake at home -Continue IV fluid and Lasix as ordered -CK is 242 -Renal ultrasound with no acute findings -Nephrology following and patient refuses hemodialysis   Severe hyperkalemia secondary to AKI and losartan use-improving -Calcium gluconate, IV insulin, D50, sodium bicarbonate, and IV fluids started in ED -Continue Lasix and IV fluid -EKG without significant changes -Repeat BMP at 1600 per nephrology recommendations -Further recommendations per nephrology   Metabolic acidosis secondary to above -Currently improved -Continue sodium bicarbonate and monitor   Influenza A   Uncontrolled hypertension-stable -Blood pressure agents held now that he is  comfort care   Ongoing tobacco abuse   Multiple life prolonging medications discontinued  on 12/31; transitioned to palliative care care on 1/1.   Anticipating discharge to SNF -with close palliative care follow-up   Code Status: DNR/comfort care Family Communication: Discussed with Nephew on phone [phone] Disposition Plan: Has been changed from hospice to SNF with palliative  care Discharging to SNF when approved  Consultants:  Nephrology    Antimicrobials:  Rocephin 12/30>>12/31     Nutritional status:  The patient's BMI is: Body mass index is 22.12 kg/m. I agree with the assessment and plan as outlined:  Nutrition Status: Nutrition Problem: Severe Malnutrition Etiology: chronic illness (COPD, CVAs) Signs/Symptoms: severe fat depletion, severe muscle depletion Interventions: Ensure Enlive (each supplement provides 350kcal and 20 grams of protein), MVI    Discharge Instructions:   Discharge Instructions     Activity as tolerated - No restrictions   Complete by: As directed    Diet - low sodium heart healthy   Complete by: As directed    Discharge instructions   Complete by: As directed    Close follow-up with palliative care if continued decline, will need hospice   Increase activity slowly   Complete by: As directed         Medication List     STOP taking these medications    predniSONE 10 MG (21) Tbpk tablet Commonly known as: STERAPRED UNI-PAK 21 TAB   tiotropium 18 MCG inhalation capsule Commonly known as: Spiriva HandiHaler       TAKE these medications    acetaminophen 325 MG tablet Commonly known as: TYLENOL Take 2 tablets (650 mg total) by mouth every 6 (six) hours as needed for mild pain (or Fever >/= 101).   albuterol 108 (90 Base) MCG/ACT inhaler Commonly known as: VENTOLIN HFA Inhale 2 puffs into the lungs every 6 (six) hours as needed for wheezing or shortness of breath.   amLODipine 10 MG tablet Commonly known as: NORVASC Take 10 mg by mouth daily.   aspirin 325 MG tablet Take 325 mg by mouth daily.   feeding supplement (NEPRO CARB STEADY) Liqd Take 237 mLs by mouth 3 (three) times daily between meals.   glycopyrrolate 1 MG tablet Commonly known as: ROBINUL Take 1 tablet (1 mg total) by mouth every 4 (four) hours as needed (excessive secretions).   labetalol 200 MG tablet Commonly known as:  NORMODYNE Take 1 tablet (200 mg total) by mouth 2 (two) times daily. What changed: Another medication with the same name was removed. Continue taking this medication, and follow the directions you see here.   levETIRAcetam 500 MG tablet Commonly known as: KEPPRA Take 500 mg by mouth 2 (two) times daily. For seizures   losartan 50 MG tablet Commonly known as: COZAAR Take 100 mg by mouth daily.        No Known Allergies   Quit smoking:  It is highly recommended that all people especially deals with diabetes to quit smoking or stay away from smoking, avoid secondhand smoking.   Vaccines:  Also highly recommended update your vaccines requirement, including SARS-CoV-2 , yearly  flu vaccine and pneumonia vaccine at least every 5 years.      Exercise: If you are able: 30 -60 minutes a day, 4 days a week, or 150 minutes a week.  The longer the better.  Combine stretch, strength, and aerobic activities.  If you were told in the past that you have high risk for cardiovascular diseases, you  may seek evaluation by your heart doctor prior to initiating moderate to intense exercise programs.    One other important lifestyle recommendation is to ensure adequate sleep - at least 6-7 hours of uninterrupted sleep at night.  Procedures /Studies:   CT Head Wo Contrast  Result Date: 05/25/2021 CLINICAL DATA:  Altered mental status. EXAM: CT HEAD WITHOUT CONTRAST TECHNIQUE: Contiguous axial images were obtained from the base of the skull through the vertex without intravenous contrast. COMPARISON:  January 04, 2012. FINDINGS: Brain: Mild diffuse cortical atrophy is noted. No mass effect or midline shift is noted. Ventricular size is within normal limits. There is no evidence of mass lesion, hemorrhage or acute infarction. Vascular: No hyperdense vessel or unexpected calcification. Skull: Normal. Negative for fracture or focal lesion. Sinuses/Orbits: No acute finding. Other: None. IMPRESSION: No acute  intracranial abnormality seen. Electronically Signed   By: Marijo Conception M.D.   On: 05/25/2021 13:31   US RENAL  Result Date: 05/25/2021 CLINICAL DATA:  Acute kidney injury. EXAM: RENAL / URINARY TRACT ULTRASOUND COMPLETE COMPARISON:  Renal ultrasound 08/12/2019 FINDINGS: Right Kidney: Renal measurements: 6.8 x 3.4 x 4.5 cm = volume: 59 mL. Echogenicity within normal limits. No mass or hydronephrosis visualized. Left Kidney: Renal measurements: 8.2 x 4.4 x 4.4 cm = volume: 82 mL. 1.3 cm cyst left midpole. Echogenicity within normal limits. No mass or hydronephrosis visualized. Bladder: Bladder volume 48 mL. No bladder mass. Ureteral jets not visualized Other: None IMPRESSION: Small volume kidneys bilaterally without acute abnormality. Electronically Signed   By: Franchot Gallo M.D.   On: 05/25/2021 16:53   US Carotid Bilateral  Result Date: 05/25/2021 CLINICAL DATA:  Altered mental status Syncope Hypertension Carotid stent History of tobacco use EXAM: BILATERAL CAROTID DUPLEX ULTRASOUND TECHNIQUE: Pearline Cables scale imaging, color Doppler and duplex ultrasound were performed of bilateral carotid and vertebral arteries in the neck. COMPARISON:  None. FINDINGS: Criteria: Quantification of carotid stenosis is based on velocity parameters that correlate the residual internal carotid diameter with NASCET-based stenosis levels, using the diameter of the distal internal carotid lumen as the denominator for stenosis measurement. The following velocity measurements were obtained: RIGHT ICA: 97/19 cm/sec CCA: 40/98 cm/sec SYSTOLIC ICA/CCA RATIO:  1.0 ECA: 116 cm/sec LEFT ICA: 116/38 cm/sec CCA: 11/91 cm/sec SYSTOLIC ICA/CCA RATIO:  1.6 ECA: 79 cm/sec RIGHT CAROTID ARTERY: Diffuse intimal thickening of the right common carotid artery. No significant atheromatous plaque identified in the visualized right ICA. RIGHT VERTEBRAL ARTERY:  Antegrade flow. LEFT CAROTID ARTERY: Minimal heterogeneous plaque of the carotid  bifurcation. Mild intimal thickening noted in the proximal left internal carotid artery. LEFT VERTEBRAL ARTERY:  Antegrade flow. IMPRESSION: Less than 50% stenosis of the internal carotid arteries. Electronically Signed   By: Miachel Roux M.D.   On: 05/25/2021 17:08   DG Chest Port 1 View  Result Date: 05/25/2021 CLINICAL DATA:  Weakness.  Fall. EXAM: PORTABLE CHEST 1 VIEW COMPARISON:  Chest x-ray dated April 22, 2016. FINDINGS: The heart size and mediastinal contours are within normal limits. Both lungs are clear. Nipple shadow at the right lung base. The visualized skeletal structures are unremarkable. IMPRESSION: No active disease. Electronically Signed   By: Titus Dubin M.D.   On: 05/25/2021 12:17   EEG adult  Result Date: 05/26/2021 Lora Havens, MD     05/26/2021 10:22 AM Patient Name: Chris Simpson MRN: [id number] Epilepsy Attending: Lora Havens Referring Physician/Provider: Dr Heath Lark Date: 05/26/2021 Duration: 23.28 mins Patient history: 88 year old male  with prior CVA, seizures who presented after an episode of alteration of awareness and fall.  EEG to evaluate for seizure. Level of alertness: Awake AEDs during EEG study: Keppra Technical aspects: This EEG study was done with scalp electrodes positioned according to the 10-20 International system of electrode placement. Electrical activity was acquired at a sampling rate of 500Hz  and reviewed with a high frequency filter of 70Hz  and a low frequency filter of 1Hz . EEG data were recorded continuously and digitally stored. Description: The posterior dominant rhythm consists of 7.5 Hz activity of moderate voltage (25-35 uV) seen predominantly in posterior head regions, symmetric and reactive to eye opening and eye closing.  EEG showed continuous 3 to 6 Hz theta-delta slowing in right fronto-centro-temporal region as well as intermittent generalized 3-5 theta-delta slowing. Hyperventilation and photic stimulation were not performed.    ABNORMALITY - Continuous slow, right fronto-centro-temporal region - Intermittent slow, generalized IMPRESSION: This study is suggestive of cortical dysfunction arising from right fronto-centro-temporal region, likely secondary to underlying stroke. Additionally there is mild diffuse encephalopathy, nonspecific etiology. No seizures or epileptiform discharges were seen throughout the recording. Lora Havens   ECHOCARDIOGRAM COMPLETE  Result Date: 05/26/2021    ECHOCARDIOGRAM REPORT   Patient Name:   Chris Simpson Date of Exam: 05/26/2021 Medical Rec #:  [id number]   Height:       68.0 in Accession #:    [id number]  Weight:       139.3 lb Date of Birth:  04/16/1932   BSA:          1.753 m Patient Age:    3 years    BP:           151/77 mmHg Patient Gender: M           HR:           86 bpm. Exam Location:  [facility] Procedure: 2D Echo, Cardiac Doppler and Color Doppler Indications:    Syncope  History:        Patient has prior history of Echocardiogram examinations, most                 recent 01/07/2012. Stroke and COPD; Risk Factors:Hypertension.  Sonographer:    Wenda Low Referring Phys: [id number] [place]  1. Left ventricular ejection fraction, by estimation, is 60 to 65%. The left ventricle has normal function. The left ventricle has no regional wall motion abnormalities. Left ventricular diastolic parameters were normal.  2. Right ventricular systolic function is normal. The right ventricular size is normal. There is moderately elevated pulmonary artery systolic pressure.  3. The mitral valve is abnormal. Trivial mitral valve regurgitation. No evidence of mitral stenosis.  4. The aortic valve is normal in structure. Aortic valve regurgitation is not visualized. No aortic stenosis is present.  5. The inferior vena cava is normal in size with greater than 50% respiratory variability, suggesting right atrial pressure of 3 mmHg. FINDINGS  Left Ventricle: Left ventricular ejection  fraction, by estimation, is 60 to 65%. The left ventricle has normal function. The left ventricle has no regional wall motion abnormalities. The left ventricular internal cavity size was normal in size. There is  no left ventricular hypertrophy. Left ventricular diastolic parameters were normal. Right Ventricle: The right ventricular size is normal. No increase in right ventricular wall thickness. Right ventricular systolic function is normal. There is moderately elevated pulmonary artery systolic pressure. The tricuspid regurgitant velocity is 3.07 m/s, and with an  assumed right atrial pressure of 8 mmHg, the estimated right ventricular systolic pressure is 70.6 mmHg. Left Atrium: Left atrial size was normal in size. Right Atrium: Right atrial size was normal in size. Pericardium: There is no evidence of pericardial effusion. Mitral Valve: The mitral valve is abnormal. There is mild thickening of the mitral valve leaflet(s). Trivial mitral valve regurgitation. No evidence of mitral valve stenosis. MV peak gradient, 4.1 mmHg. The mean mitral valve gradient is 2.0 mmHg. Tricuspid Valve: The tricuspid valve is normal in structure. Tricuspid valve regurgitation is not demonstrated. No evidence of tricuspid stenosis. Aortic Valve: The aortic valve is normal in structure. Aortic valve regurgitation is not visualized. No aortic stenosis is present. Aortic valve mean gradient measures 3.0 mmHg. Aortic valve peak gradient measures 6.0 mmHg. Aortic valve area, by VTI measures 1.91 cm. Pulmonic Valve: The pulmonic valve was normal in structure. Pulmonic valve regurgitation is not visualized. No evidence of pulmonic stenosis. Aorta: The aortic root is normal in size and structure. Venous: The inferior vena cava is normal in size with greater than 50% respiratory variability, suggesting right atrial pressure of 3 mmHg. IAS/Shunts: No atrial level shunt detected by color flow Doppler.  LEFT VENTRICLE PLAX 2D LVIDd:         3.70  cm     Diastology LVIDs:         2.70 cm     LV e' medial:    3.92 cm/s LV PW:         1.00 cm     LV E/e' medial:  22.4 LV IVS:        1.10 cm     LV e' lateral:   6.85 cm/s LVOT diam:     1.80 cm     LV E/e' lateral: 12.8 LV SV:         58 LV SV Index:   33 LVOT Area:     2.54 cm  LV Volumes (MOD) LV vol d, MOD A2C: 38.4 ml LV vol d, MOD A4C: 40.0 ml LV vol s, MOD A2C: 15.2 ml LV vol s, MOD A4C: 15.2 ml LV SV MOD A2C:     23.2 ml LV SV MOD A4C:     40.0 ml LV SV MOD BP:      26.0 ml RIGHT VENTRICLE RV Basal diam:  3.60 cm RV Mid diam:    2.70 cm RV S prime:     10.00 cm/s TAPSE (M-mode): 2.4 cm LEFT ATRIUM             Index        RIGHT ATRIUM           Index LA diam:        3.10 cm 1.77 cm/m   RA Area:     14.50 cm LA Vol (A2C):   51.6 ml 29.44 ml/m  RA Volume:   42.00 ml  23.96 ml/m LA Vol (A4C):   63.4 ml 36.17 ml/m LA Biplane Vol: 61.6 ml 35.15 ml/m  AORTIC VALVE                    PULMONIC VALVE AV Area (Vmax):    2.00 cm     PV Vmax:       1.12 m/s AV Area (Vmean):   1.64 cm     PV Peak grad:  5.0 mmHg AV Area (VTI):     1.91 cm AV Vmax:  122.00 cm/s AV Vmean:          83.200 cm/s AV VTI:            0.301 m AV Peak Grad:      6.0 mmHg AV Mean Grad:      3.0 mmHg LVOT Vmax:         96.00 cm/s LVOT Vmean:        53.700 cm/s LVOT VTI:          0.226 m LVOT/AV VTI ratio: 0.75  AORTA Ao Root diam: 2.90 cm MITRAL VALVE               TRICUSPID VALVE MV Area (PHT): 2.63 cm    TR Peak grad:   37.7 mmHg MV Area VTI:   2.13 cm    TR Vmax:        307.00 cm/s MV Peak grad:  4.1 mmHg MV Mean grad:  2.0 mmHg    SHUNTS MV Vmax:       1.01 m/s    Systemic VTI:  0.23 m MV Vmean:      64.8 cm/s   Systemic Diam: 1.80 cm MV Decel Time: 288 msec MV E velocity: 87.80 cm/s MV A velocity: 90.40 cm/s MV E/A ratio:  0.97 Jenkins Rouge MD Electronically signed by Jenkins Rouge MD Signature Date/Time: 05/26/2021/11:58:48 AM    Final     Subjective:   Patient was seen and examined 05/31/2021, 11:29 AM Patient stable  today. No acute distress.  No issues overnight Stable for discharge.  Discharge Exam:    Vitals:   05/30/21 0500 05/30/21 0912 05/30/21 1403 05/31/21 0503  BP: (!) 155/64  (!) 150/66 (!) 137/56  Pulse: 74  70 79  Resp: 18  18 20   Temp: 97.7 F (36.5 C)  97.6 F (36.4 C) 99.1 F (37.3 C)  TempSrc: Oral  Oral   SpO2: 96% 96% 96% 100%  Weight:      Height:        General: Pt lying comfortably in bed & appears in no obvious distress. Cardiovascular: S1 & S2 heard, RRR, S1/S2 +. No murmurs, rubs, gallops or clicks. No JVD or pedal edema. Respiratory: Clear to auscultation without wheezing, rhonchi or crackles. No increased work of breathing. Abdominal:  Non-distended, non-tender & soft. No organomegaly or masses appreciated. Normal bowel sounds heard. CNS: Alert and oriented. No focal deficits. Extremities: no edema, no cyanosis      The results of significant diagnostics from this hospitalization (including imaging, microbiology, ancillary and laboratory) are listed below for reference.      Microbiology:   Recent Results (from the past 240 hour(s))  Resp Panel by RT-PCR (Flu A&B, Covid) Nasopharyngeal Swab     Status: Abnormal   Collection Time: 05/25/21 12:26 PM   Specimen: Nasopharyngeal Swab; Nasopharyngeal(NP) swabs in vial transport medium  Result Value Ref Range Status   SARS Coronavirus 2 by RT PCR NEGATIVE NEGATIVE Final    Comment: (NOTE) SARS-CoV-2 target nucleic acids are NOT DETECTED.  The SARS-CoV-2 RNA is generally detectable in upper respiratory specimens during the acute phase of infection. The lowest concentration of SARS-CoV-2 viral copies this assay can detect is 138 copies/mL. A negative result does not preclude SARS-Cov-2 infection and should not be used as the sole basis for treatment or other patient management decisions. A negative result may occur with  improper specimen collection/handling, submission of specimen other than nasopharyngeal  swab, presence of viral mutation(s) within the areas  targeted by this assay, and inadequate number of viral copies(<138 copies/mL). A negative result must be combined with clinical observations, patient history, and epidemiological information. The expected result is Negative.  Fact Sheet for Patients:  EntrepreneurPulse.com.au  Fact Sheet for Healthcare Providers:  IncredibleEmployment.be  This test is no t yet approved or cleared by the [place] FDA and  has been authorized for detection and/or diagnosis of SARS-CoV-2 by FDA under an Emergency Use Authorization (EUA). This EUA will remain  in effect (meaning this test can be used) for the duration of the COVID-19 declaration under Section 564(b)(1) of the Act, 21 U.S.C.section 360bbb-3(b)(1), unless the authorization is terminated  or revoked sooner.       Influenza A by PCR POSITIVE (A) NEGATIVE Final   Influenza B by PCR NEGATIVE NEGATIVE Final    Comment: (NOTE) The Xpert Xpress SARS-CoV-2/FLU/RSV plus assay is intended as an aid in the diagnosis of influenza from Nasopharyngeal swab specimens and should not be used as a sole basis for treatment. Nasal washings and aspirates are unacceptable for Xpert Xpress SARS-CoV-2/FLU/RSV testing.  Fact Sheet for Patients: EntrepreneurPulse.com.au  Fact Sheet for Healthcare Providers: IncredibleEmployment.be  This test is not yet approved or cleared by the [place] FDA and has been authorized for detection and/or diagnosis of SARS-CoV-2 by FDA under an Emergency Use Authorization (EUA). This EUA will remain in effect (meaning this test can be used) for the duration of the COVID-19 declaration under Section 564(b)(1) of the Act, 21 U.S.C. section 360bbb-3(b)(1), unless the authorization is terminated or revoked.  Performed at [facility], [address] [place], [place] [address]   MRSA Next Gen by  PCR, Nasal     Status: None   Collection Time: 05/25/21  8:44 PM   Specimen: Nasal Mucosa; Nasal Swab  Result Value Ref Range Status   MRSA by PCR Next Gen NOT DETECTED NOT DETECTED Final    Comment: (NOTE) The GeneXpert MRSA Assay (FDA approved for NASAL specimens only), is one component of a comprehensive MRSA colonization surveillance program. It is not intended to diagnose MRSA infection nor to guide or monitor treatment for MRSA infections. Test performance is not FDA approved in patients less than 70 years old. Performed at [facility], [address], [place], [place] [address]   Urine Culture     Status: Abnormal   Collection Time: 05/26/21  7:59 AM   Specimen: Urine, Catheterized  Result Value Ref Range Status   Specimen Description   Final    URINE, CATHETERIZED Performed at [facility], [address] [place], [place] [address]    Special Requests   Final    NONE Performed at [facility], [address], [place], [place] [address]    Culture MULTIPLE SPECIES PRESENT, SUGGEST RECOLLECTION (A)  Final   Report Status 05/27/2021 FINAL  Final     Labs:   CBC: Recent Labs  Lab 05/25/21 1247 05/25/21 1542 05/26/21 0421 05/26/21 0729 05/27/21 0553 05/30/21 1136  WBC 10.1 8.7 6.0 5.6 5.0 5.4  NEUTROABS 7.2  --   --   --   --   --   HGB 8.2* 7.6* 6.7* 6.6* 6.2* 6.3*  HCT 26.6* 26.1* 20.8* 19.9* 19.9* 19.7*  MCV 116.2* 120.3* 111.8* 108.2* 109.9* 112.6*  PLT 190 178 149* 141* 132* 417   Basic Metabolic Panel: Recent Labs  Lab 05/25/21 1544 05/25/21 2018 05/26/21 0421 05/26/21 0520 05/26/21 1345 05/27/21 0553 05/30/21 1136  NA 145 142 143  --  142 144 140  K 6.5* 7.0* 6.1* 6.1* 5.2* 5.3* 4.4  CL 120* 119* 114*  --  107 106 105  CO2 11* 14* 18*  --  23 25 23   GLUCOSE 149* 116* 141*  --  172* 97 145*  BUN 113* 99* 97*  --  91* 84* 79*  CREATININE 6.72* 5.86* 5.45*  --  5.38* 5.35* 5.49*  CALCIUM 10.7* 9.1 9.1  --  8.5* 8.5* 9.0  MG  --   --  1.5*  --    --  1.3*  --   PHOS 4.8*  --  3.8  --   --   --   --    Liver Function Tests: Recent Labs  Lab 05/25/21 1247 05/25/21 1544 05/26/21 0421  AST 26  --   --   ALT 15  --   --   ALKPHOS 76  --   --   BILITOT 0.7  --   --   PROT 9.1*  --   --   ALBUMIN 4.7 4.5 3.6   BNP (last 3 results) No results for input(s): BNP in the last 8760 hours. Cardiac Enzymes: Recent Labs  Lab 05/25/21 1247  CKTOTAL 242   CBG: No results for input(s): GLUCAP in the last 168 hours. Hgb A1c No results for input(s): HGBA1C in the last 72 hours. Lipid Profile No results for input(s): CHOL, HDL, LDLCALC, TRIG, CHOLHDL, LDLDIRECT in the last 72 hours. Thyroid function studies No results for input(s): TSH, T4TOTAL, T3FREE, THYROIDAB in the last 72 hours.  Invalid input(s): FREET3 Anemia work up No results for input(s): VITAMINB12, FOLATE, FERRITIN, TIBC, IRON, RETICCTPCT in the last 72 hours. Urinalysis    Component Value Date/Time   COLORURINE YELLOW 05/25/2021 1706   APPEARANCEUR CLOUDY (A) 05/25/2021 1706   LABSPEC 1.012 05/25/2021 1706   PHURINE 5.0 05/25/2021 1706   GLUCOSEU 50 (A) 05/25/2021 1706   HGBUR MODERATE (A) 05/25/2021 1706   BILIRUBINUR NEGATIVE 05/25/2021 1706   KETONESUR NEGATIVE 05/25/2021 1706   PROTEINUR 100 (A) 05/25/2021 1706   UROBILINOGEN 0.2 02/20/2012 1745   NITRITE POSITIVE (A) 05/25/2021 1706   LEUKOCYTESUR LARGE (A) 05/25/2021 1706         Time coordinating discharge: Over 45 minutes  SIGNED: Deatra James, MD, FACP, [facility]. [facility],  Please use [email or web address] to Page If 7PM-7AM, please contact night-coverage [email or web address],  05/31/2021, 11:29 AM

## 2021-05-31 NOTE — Progress Notes (Signed)
Palliative: Chart review completed.  Chris Simpson is to discharge to short-term rehab with outpatient palliative services. Conference with attending and transition of care team related to patient condition, needs, disposition.  Plan: Discharging to short-term rehab with outpatient palliative services.  No charge Quinn Axe, NP Palliative medicine team Team phone [phone] Greater than 50% of this time was spent counseling and coordinating care related to the above assessment and plan.

## 2021-05-31 NOTE — TOC Transition Note (Signed)
Transition of Care [facility]) - CM/SW Discharge Note   Patient Details  Name: Chris Simpson MRN: [id number] Date of Birth: [birth year]  [facility]) CM/SW Contact:  Shade Flood, LCSW Phone Number: 05/31/2021, 2:58 PM   Clinical Narrative:     Pt stable for dc today per MD. Pt is accepted at [facility], family's preference. Updated pt's nephew, [name], who is in agreement with dc plan. DC clinical sent electronically. RN to call report. EMS arranged. There are no other TOC needs for dc.  Final next level of care: Skilled Nursing Facility Barriers to Discharge: Barriers Resolved   Patient Goals and CMS Choice Patient states their goals for this hospitalization and ongoing recovery are:: snf rehab CMS Medicare.gov Compare Post Acute Care list provided to:: Patient Represenative (must comment) Choice offered to / list presented to : [place] / Guardian  Discharge Placement   Existing PASRR number confirmed : 05/30/21          Patient chooses bed at: [facility] Patient to be transferred to facility by: EMS Name of family member notified: Lorelle Gibbs Patient and family notified of of transfer: 05/31/21  Discharge Plan and Services In-house Referral: Clinical Social Work   Post Acute Care Choice: [place]                               Social Determinants of Health (SDOH) Interventions     Readmission Risk Interventions Readmission Risk Prevention Plan 05/28/2021  Transportation Screening Complete  Home Care Screening Complete  Medication Review (RN CM) Complete  Some recent data might be hidden

## 2021-06-01 LAB — TYPE AND SCREEN
ABO/RH(D): O POS
Antibody Screen: NEGATIVE
Unit division: 0

## 2021-06-01 LAB — BPAM RBC
Blood Product Expiration Date: 202302022359
Unit Type and Rh: 5100

## 2021-06-05 DIAGNOSIS — R627 Adult failure to thrive: Secondary | ICD-10-CM | POA: Diagnosis not present

## 2021-06-05 DIAGNOSIS — G9341 Metabolic encephalopathy: Secondary | ICD-10-CM | POA: Diagnosis not present

## 2021-06-05 DIAGNOSIS — R5381 Other malaise: Secondary | ICD-10-CM | POA: Diagnosis not present

## 2021-06-05 DIAGNOSIS — N1832 Chronic kidney disease, stage 3b: Secondary | ICD-10-CM | POA: Diagnosis not present

## 2021-06-06 DIAGNOSIS — R5381 Other malaise: Secondary | ICD-10-CM | POA: Diagnosis not present

## 2021-06-06 DIAGNOSIS — G9341 Metabolic encephalopathy: Secondary | ICD-10-CM | POA: Diagnosis not present

## 2021-06-09 DIAGNOSIS — R5381 Other malaise: Secondary | ICD-10-CM | POA: Diagnosis not present

## 2021-06-09 DIAGNOSIS — R627 Adult failure to thrive: Secondary | ICD-10-CM | POA: Diagnosis not present

## 2021-06-09 DIAGNOSIS — N1832 Chronic kidney disease, stage 3b: Secondary | ICD-10-CM | POA: Diagnosis not present

## 2021-06-09 DIAGNOSIS — G9341 Metabolic encephalopathy: Secondary | ICD-10-CM | POA: Diagnosis not present

## 2021-06-28 DEATH — deceased

## 2021-10-31 NOTE — Patient Outreach (Signed)
[facility]) Care Management  10/31/2021  OTHELLO DICKENSON 11/02/1931 [id number]   Received referral for Care Management from Insurance plan. Assigned patient to Raina Mina, RN Care Coordinator for follow up.   [place] Management Assistant [phone]

## 2021-11-13 ENCOUNTER — Other Ambulatory Visit: Payer: Self-pay | Admitting: *Deleted

## 2021-11-13 NOTE — Patient Outreach (Signed)
[facility]) Care Management  11/13/2021  Chris Simpson 1932-05-07 [id number]   Telephone Screen-Unsuccessful #1  RN attempted outreach call that was unsuccessful. RN able to leave a HIPAA approved voice message requesting a call back.  Will send outreach letter and follow up over the next week for pending [facility] services.  Raina Mina, RN Care Management Coordinator [place] Office [phone]

## 2021-11-21 ENCOUNTER — Ambulatory Visit: Payer: Self-pay | Admitting: *Deleted

## 2022-08-27 IMAGING — CT CT HEAD W/O CM
3 series · 16 of 47 positions shown, 19 images · non-contrast
Comparison: January 04, 2012.

CLINICAL DATA: Altered mental status.

EXAM:
CT HEAD WITHOUT CONTRAST
TECHNIQUE: Contiguous axial images were obtained from the base of the skull
through the vertex without intravenous contrast.

[Series 2: head w o · axial · 0.47mm/px · z∈[+9,+134]mm · 10 of 30 slices shown, 13 images]
[im 3/30  brain]
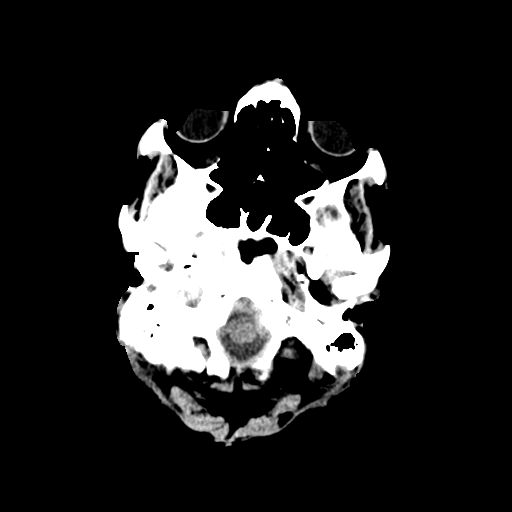
[im 3/30  bone]
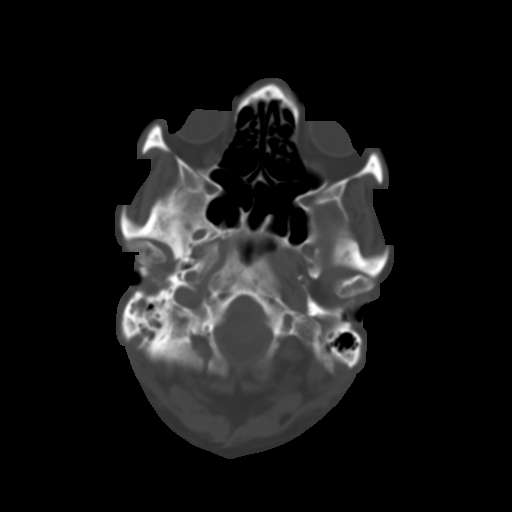
[im 6/30  brain]
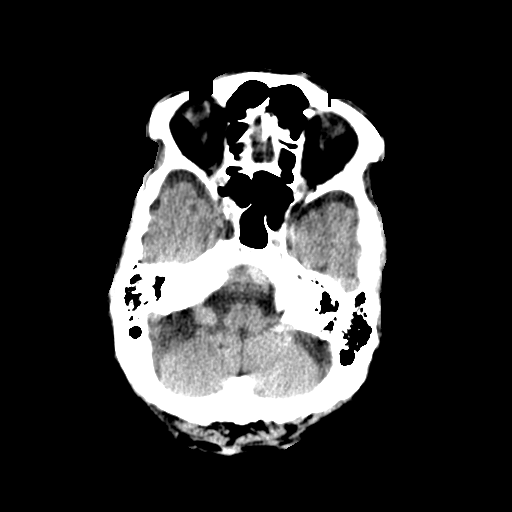
[im 9/30  brain]
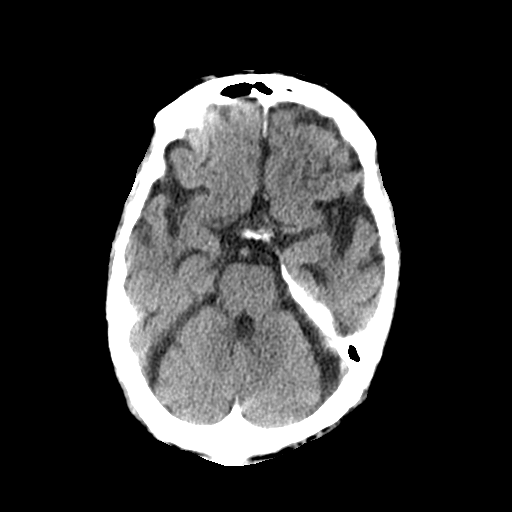
[im 11/30  brain]
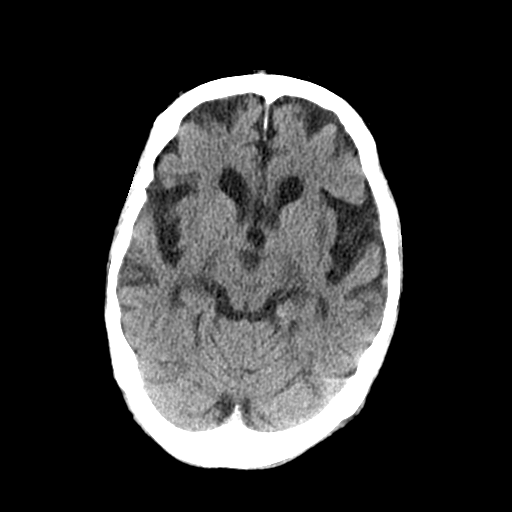
[im 14/30  brain]
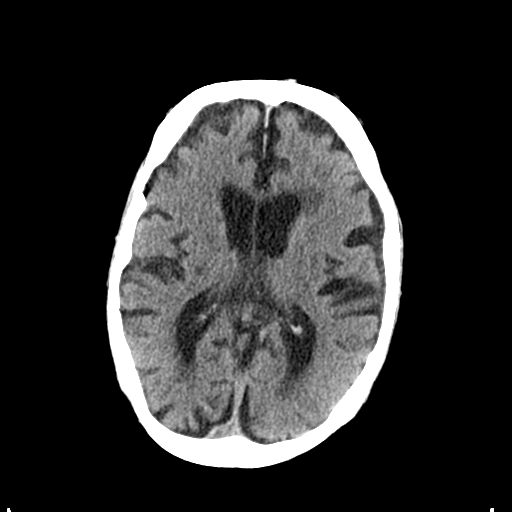
[im 14/30  bone]
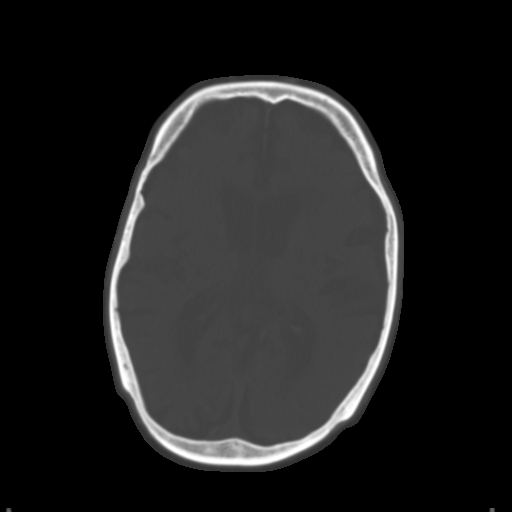
[im 17/30  brain]
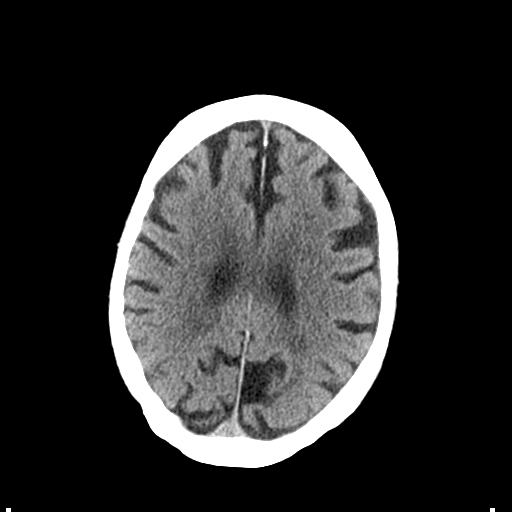
[im 20/30  brain]
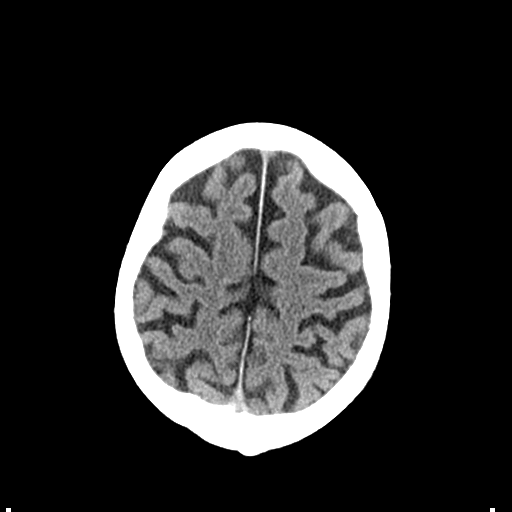
[im 23/30  brain]
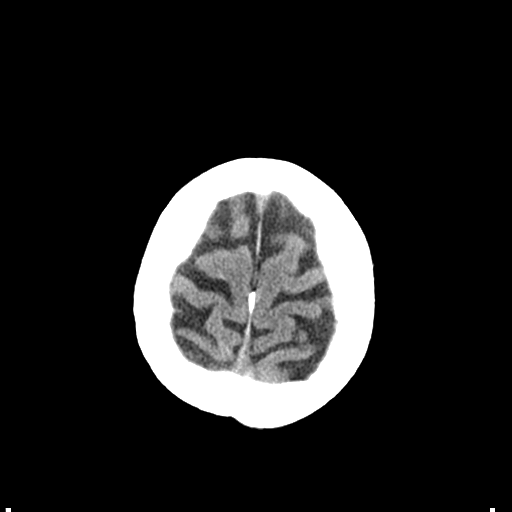
[im 25/30  brain]
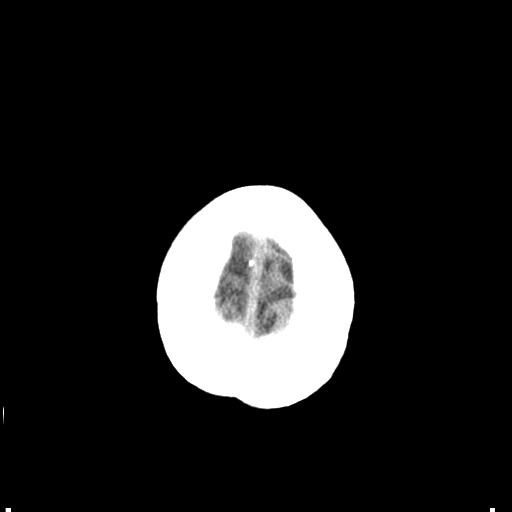
[im 25/30  bone]
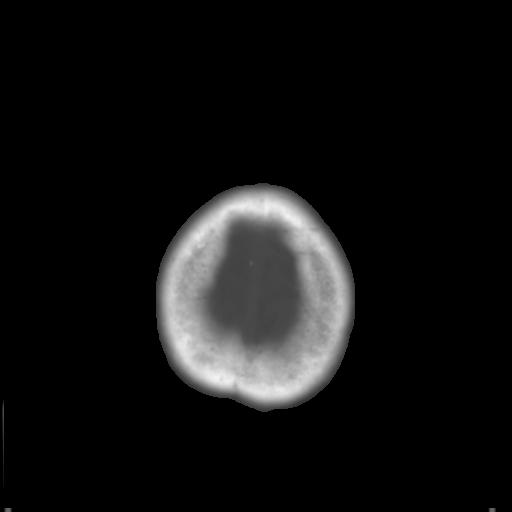
[im 28/30  brain]
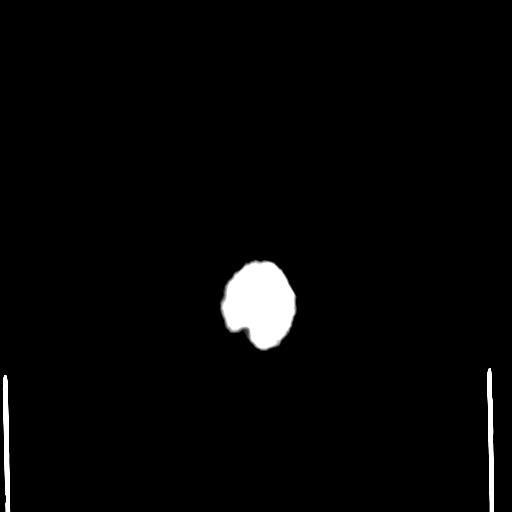

[Series 4: coronal soft · coronal · 0.34mm/px · 3 of 68 slices shown]
[im 23/68  brain]
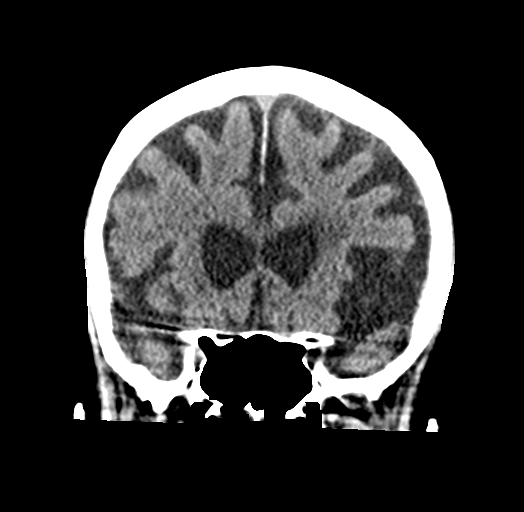
[im 30/68  brain]
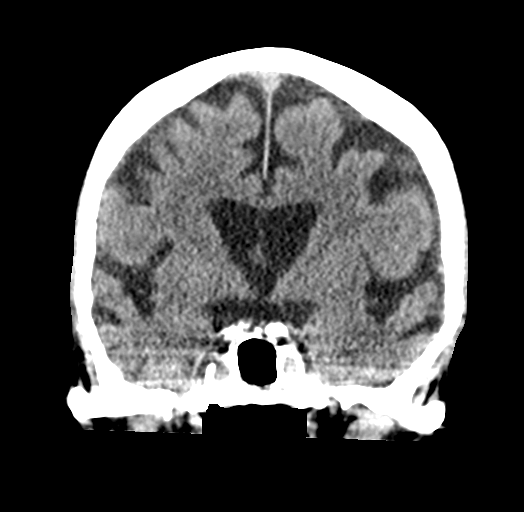
[im 38/68  brain]
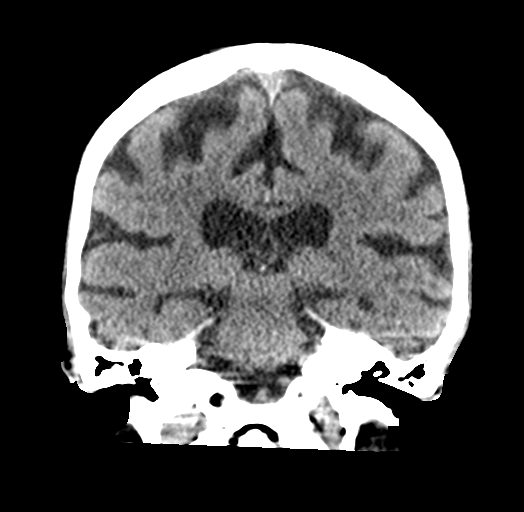

[Series 5: sagittal soft · sagittal · 0.34mm/px · 3 of 56 slices shown]
[im 19/56  brain]
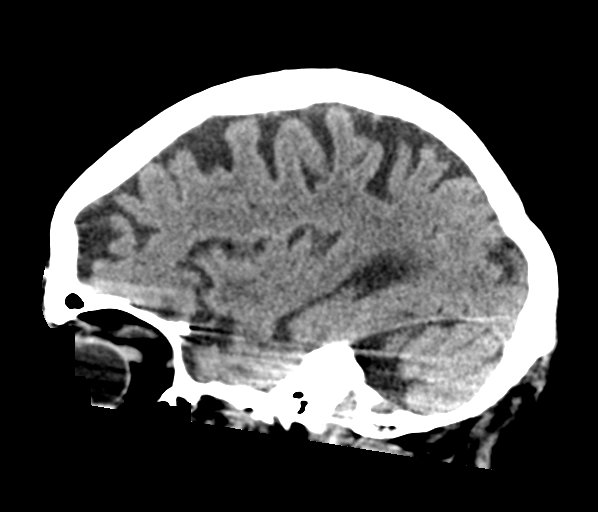
[im 28/56  brain]
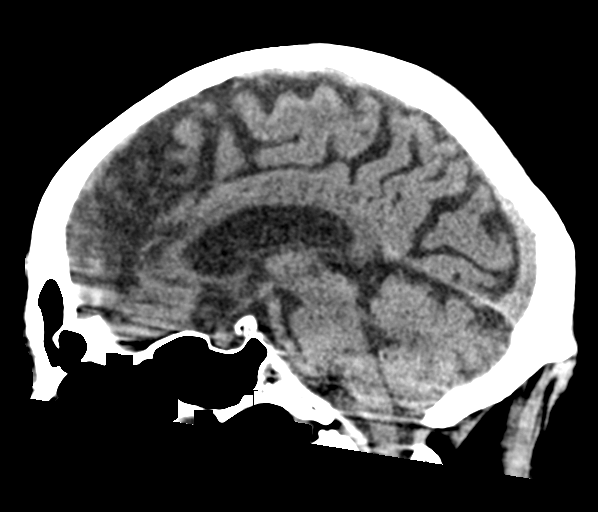
[im 37/56  brain]
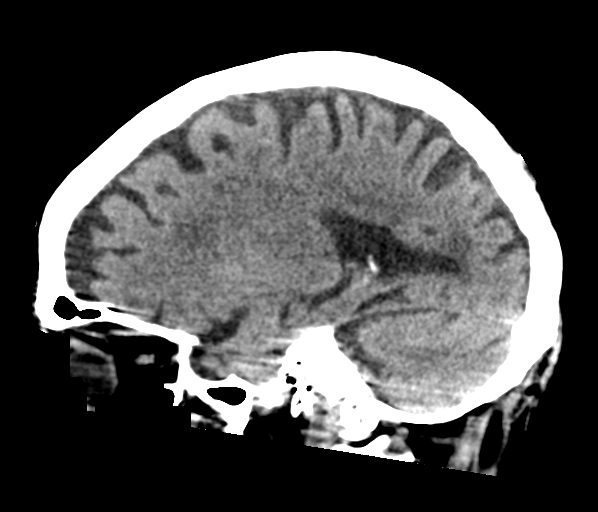

[16 of 47 positions shown; findings below may reference images not displayed]

FINDINGS: Brain: Mild diffuse cortical atrophy is noted. No mass effect or
midline shift is noted. Ventricular size is within normal limits.
There is no evidence of mass lesion, hemorrhage or acute infarction.

Vascular: No hyperdense vessel or unexpected calcification.

Skull: Normal. Negative for fracture or focal lesion.

Sinuses/Orbits: No acute finding.

Other: None.
IMPRESSION: No acute intracranial abnormality seen.
